# Patient Record
Sex: Female | Born: 1950
Health system: Southern US, Community
[De-identification: ages and names within clinical notes are randomized; demographics above are authoritative.]

## PROBLEM LIST (undated history)

## (undated) DIAGNOSIS — F0390 Unspecified dementia without behavioral disturbance: Secondary | ICD-10-CM

## (undated) DIAGNOSIS — M5030 Other cervical disc degeneration, unspecified cervical region: Secondary | ICD-10-CM

## (undated) DIAGNOSIS — K219 Gastro-esophageal reflux disease without esophagitis: Secondary | ICD-10-CM

## (undated) DIAGNOSIS — F329 Major depressive disorder, single episode, unspecified: Secondary | ICD-10-CM

## (undated) DIAGNOSIS — N200 Calculus of kidney: Secondary | ICD-10-CM

## (undated) DIAGNOSIS — E785 Hyperlipidemia, unspecified: Secondary | ICD-10-CM

## (undated) DIAGNOSIS — F32A Depression, unspecified: Secondary | ICD-10-CM

## (undated) DIAGNOSIS — I10 Essential (primary) hypertension: Secondary | ICD-10-CM

## (undated) HISTORY — DX: Essential (primary) hypertension: I10

## (undated) HISTORY — DX: Gastro-esophageal reflux disease without esophagitis: K21.9

## (undated) HISTORY — DX: Calculus of kidney: N20.0

## (undated) HISTORY — DX: Depression, unspecified: F32.A

## (undated) HISTORY — DX: Hyperlipidemia, unspecified: E78.5

## (undated) HISTORY — DX: Other cervical disc degeneration, unspecified cervical region: M50.30

## (undated) HISTORY — DX: Major depressive disorder, single episode, unspecified: F32.9

---

## 1986-09-14 HISTORY — PX: OTHER SURGICAL HISTORY: SHX169

## 1998-07-23 ENCOUNTER — Other Ambulatory Visit: Admission: RE | Admit: 1998-07-23 | Discharge: 1998-07-23 | Payer: Self-pay | Admitting: *Deleted

## 2001-07-04 ENCOUNTER — Other Ambulatory Visit: Admission: RE | Admit: 2001-07-04 | Discharge: 2001-07-04 | Payer: Self-pay | Admitting: *Deleted

## 2001-07-18 ENCOUNTER — Encounter: Admission: RE | Admit: 2001-07-18 | Discharge: 2001-07-18 | Payer: Self-pay | Admitting: *Deleted

## 2001-07-18 ENCOUNTER — Encounter: Payer: Self-pay | Admitting: *Deleted

## 2002-07-07 ENCOUNTER — Other Ambulatory Visit: Admission: RE | Admit: 2002-07-07 | Discharge: 2002-07-07 | Payer: Self-pay | Admitting: *Deleted

## 2002-08-01 ENCOUNTER — Encounter: Admission: RE | Admit: 2002-08-01 | Discharge: 2002-08-01 | Payer: Self-pay | Admitting: *Deleted

## 2002-08-01 ENCOUNTER — Encounter: Payer: Self-pay | Admitting: *Deleted

## 2002-11-13 ENCOUNTER — Encounter (INDEPENDENT_AMBULATORY_CARE_PROVIDER_SITE_OTHER): Payer: Self-pay

## 2002-11-13 ENCOUNTER — Ambulatory Visit (HOSPITAL_COMMUNITY): Admission: RE | Admit: 2002-11-13 | Discharge: 2002-11-13 | Payer: Self-pay | Admitting: *Deleted

## 2003-07-10 ENCOUNTER — Other Ambulatory Visit: Admission: RE | Admit: 2003-07-10 | Discharge: 2003-07-10 | Payer: Self-pay | Admitting: *Deleted

## 2003-08-03 ENCOUNTER — Encounter: Admission: RE | Admit: 2003-08-03 | Discharge: 2003-08-03 | Payer: Self-pay | Admitting: *Deleted

## 2004-07-14 ENCOUNTER — Ambulatory Visit (HOSPITAL_COMMUNITY): Admission: RE | Admit: 2004-07-14 | Discharge: 2004-07-14 | Payer: Self-pay | Admitting: Family Medicine

## 2004-08-14 ENCOUNTER — Ambulatory Visit (HOSPITAL_COMMUNITY): Admission: RE | Admit: 2004-08-14 | Discharge: 2004-08-14 | Payer: Self-pay | Admitting: *Deleted

## 2004-11-19 ENCOUNTER — Encounter (INDEPENDENT_AMBULATORY_CARE_PROVIDER_SITE_OTHER): Payer: Self-pay | Admitting: Specialist

## 2004-11-19 ENCOUNTER — Ambulatory Visit (HOSPITAL_COMMUNITY): Admission: RE | Admit: 2004-11-19 | Discharge: 2004-11-19 | Payer: Self-pay | Admitting: *Deleted

## 2005-10-23 ENCOUNTER — Ambulatory Visit (HOSPITAL_COMMUNITY): Admission: RE | Admit: 2005-10-23 | Discharge: 2005-10-23 | Payer: Self-pay | Admitting: *Deleted

## 2006-01-14 ENCOUNTER — Encounter: Admission: RE | Admit: 2006-01-14 | Discharge: 2006-01-14 | Payer: Self-pay | Admitting: Family Medicine

## 2006-07-02 ENCOUNTER — Ambulatory Visit (HOSPITAL_COMMUNITY): Admission: RE | Admit: 2006-07-02 | Discharge: 2006-07-03 | Payer: Self-pay | Admitting: Neurological Surgery

## 2006-07-27 ENCOUNTER — Encounter: Admission: RE | Admit: 2006-07-27 | Discharge: 2006-07-27 | Payer: Self-pay | Admitting: Neurological Surgery

## 2006-09-14 HISTORY — PX: NECK SURGERY: SHX720

## 2006-11-01 ENCOUNTER — Ambulatory Visit (HOSPITAL_COMMUNITY): Admission: RE | Admit: 2006-11-01 | Discharge: 2006-11-01 | Payer: Self-pay | Admitting: *Deleted

## 2006-11-01 ENCOUNTER — Encounter: Admission: RE | Admit: 2006-11-01 | Discharge: 2006-11-01 | Payer: Self-pay | Admitting: Neurological Surgery

## 2007-01-03 ENCOUNTER — Encounter: Admission: RE | Admit: 2007-01-03 | Discharge: 2007-01-03 | Payer: Self-pay | Admitting: Neurological Surgery

## 2007-04-12 ENCOUNTER — Encounter: Admission: RE | Admit: 2007-04-12 | Discharge: 2007-04-12 | Payer: Self-pay | Admitting: Neurological Surgery

## 2007-11-21 ENCOUNTER — Encounter: Admission: RE | Admit: 2007-11-21 | Discharge: 2007-11-21 | Payer: Self-pay | Admitting: Neurological Surgery

## 2007-12-06 ENCOUNTER — Ambulatory Visit (HOSPITAL_COMMUNITY): Admission: RE | Admit: 2007-12-06 | Discharge: 2007-12-06 | Payer: Self-pay | Admitting: *Deleted

## 2007-12-06 ENCOUNTER — Encounter: Admission: RE | Admit: 2007-12-06 | Discharge: 2007-12-06 | Payer: Self-pay | Admitting: Neurological Surgery

## 2007-12-14 ENCOUNTER — Encounter: Admission: RE | Admit: 2007-12-14 | Discharge: 2007-12-14 | Payer: Self-pay | Admitting: Neurological Surgery

## 2009-09-16 ENCOUNTER — Encounter: Admission: RE | Admit: 2009-09-16 | Discharge: 2009-09-16 | Payer: Self-pay | Admitting: Family Medicine

## 2010-10-05 ENCOUNTER — Encounter: Payer: Self-pay | Admitting: Neurological Surgery

## 2010-10-05 ENCOUNTER — Encounter: Payer: Self-pay | Admitting: *Deleted

## 2011-01-30 NOTE — Op Note (Signed)
   NAME:  Tina Fitzpatrick, Tina Fitzpatrick                         ACCOUNT NO.:  [id number]   MEDICAL RECORD NO.:  [id number]                   PATIENT TYPE:  AMB   LOCATION:  ENDO                                 FACILITY:  [facility]   PHYSICIAN:  Georgiana Spinner, M.D.                 DATE OF BIRTH:  Apr 22, 1951   DATE OF PROCEDURE:  11/13/2002  DATE OF DISCHARGE:                                 OPERATIVE REPORT   PROCEDURE:  Upper endoscopy.   ENDOSCOPIST:  Georgiana Spinner, M.D.   INDICATIONS FOR PROCEDURE:  Gastroesophageal reflux disease.   ANESTHESIA:  Demerol 100 mg, Versed 10 mg.  Additionally Phenergan 12.5 mg.   DESCRIPTION OF PROCEDURE:  With the patient mildly sedated, in the left  lateral decubitus position, the Olympus videoscopic endoscope was inserted  into the mouth and passed under direct vision through the esophagus, which  appeared normal except for a question of Barrett's.  The delineation line  was not very clear, so we could photograph and biopsy this area.  Entered  into the stomach.  The fundus, body, antrum, duodenal bulb, second portion  of the duodenum all appeared normal.  From this point the endoscope was  slowly withdrawn, taking circumferential views of the entire duodenal  mucosa.  The endoscope was then pulled back into the stomach and placed in  retroflexion to view the stomach from below.  The endoscope was straightened  and withdrawn, taking circumferential views of the remaining gastric and  esophageal mucosa.  The patient's vital signs and pulse oximeter remained  stable.  The patient tolerated the procedure well without apparent complications.   FINDINGS:  A question of Barrett's esophagus.   BIOPSY:  Await the biopsy report.   DISPOSITION:  The patient will call me for the results and follow up with me  as an outpatient.  Proceed to colonoscopy as planned.                                                Georgiana Spinner, M.D.    GMO/MEDQ  D:  11/13/2002  T:   11/13/2002  Job:  [id number]

## 2011-01-30 NOTE — Op Note (Signed)
   NAME:  Tina Fitzpatrick, Tina Fitzpatrick                         ACCOUNT NO.:  [id number]   MEDICAL RECORD NO.:  [id number]                   PATIENT TYPE:  AMB   LOCATION:  ENDO                                 FACILITY:  [facility]   PHYSICIAN:  Georgiana Spinner, M.D.                 DATE OF BIRTH:  28-Apr-1951   DATE OF PROCEDURE:  11/13/2002  DATE OF DISCHARGE:                                 OPERATIVE REPORT   PROCEDURE PERFORMED:  Colonoscopy.   ENDOSCOPIST:  Georgiana Spinner, M.D.   INDICATIONS FOR PROCEDURE:  Colon polyp, colon cancer screening.   ANESTHESIA:  Demerol 25 mg, Versed 2 mg.   DESCRIPTION OF PROCEDURE:  With the patient mildly sedated in the left  lateral decubitus position, the Olympus video colonoscope was inserted in  the rectum and passed under direct vision to the cecum, identified by the  ileocecal valve and appendiceal orifice, the latter of which was  photographed.  From this point the colonoscope was slowly withdrawn taking  circumferential views of the entire colonic mucosa stopping at 10 cm from  the anal verge at which point, a small polyp was seen and photographed and  removed using hot biopsy forceps technique on a setting of 20/20 blended  current.  The endoscope was straightened and withdrawn taking  circumferential views of the remaining colonic mucosa both on direct and  retroflex view of the rectum.  The patient's vital signs and pulse oximeter  remained stable.  The patient tolerated the procedure well without apparent  complications.   FINDINGS:  Small polyp at 10 cm from the anal verge.  Otherwise unremarkable  examination other than hemorrhoids.   PLAN:  Have the patient call me for results of the biopsy and follow up with  me as an outpatient.                                                 Georgiana Spinner, M.D.    GMO/MEDQ  D:  11/13/2002  T:  11/13/2002  Job:  [id number]

## 2011-01-30 NOTE — Op Note (Signed)
NAME:  Tina Fitzpatrick, FEES NO.:  [id number]   MEDICAL RECORD NO.:  [id number]          PATIENT TYPE:  OIB   LOCATION:  3039                         FACILITY:  [facility]   PHYSICIAN:  Tia Alert, MD     DATE OF BIRTH:  [birth year]   DATE OF PROCEDURE:  07/02/2006  DATE OF DISCHARGE:  07/03/2006                                 OPERATIVE REPORT   PREOPERATIVE DIAGNOSIS:  Cervical spondylosis C5-6, C6-7.   POSTOPERATIVE DIAGNOSIS:  Cervical spondylosis C5-6, C6-7.   PROCEDURE:  1. Decompressive anterior cervical diskectomy C5-6, C6-7.  2. Anterior cervical arthrodesis C5-6, C6-7 utilizing an 8-mm PEEK      interbody cage packed with local autograft and Vitoss at C5-6 and a 7-      mm PEEK interbody cage packed with Vitoss and local bone at C6-7.  3. Anterior cervical plating C5 to C7 inclusive utilizing a 40-mm venture      plate.   SURGEON:  Tia Alert, M.D.   ASSISTANT:  Donalee Citrin, M.D.   ANESTHESIA:  General endotracheal.   COMPLICATIONS:  None apparent.   INDICATIONS FOR PROCEDURE:  Ms. Tina Fitzpatrick is a 60 year old female who was  referred with severe neck pain with bilateral shoulder pain.  She tried  medical management for quite some time without significant relief.  Her pain  became progressively worse.  She had an MRI which showed significant  spondylosis at C5-6 and C6-7.  I recommended a two-level ACDF with plating  at C5-6 and C6-7 in hopes of improving her pain syndrome.  She understood  the risks, benefits, expected outcome and wished to proceed.   DESCRIPTION OF PROCEDURE:  The patient was taken to the operating room and  after induction of adequate general endotracheal anesthesia, she was placed  in the supine position on the operating room table.  Her right anterior  cervical region was prepped with DuraPrep and then draped in the usual  sterile fashion.  Three mL of local anesthesia was injected and a small  transverse incision was made to the  right of midline and carried down to the  platysma which was elevated, opened and undermined with Metzenbaum scissors.  I then dissected a plane medial to the sternocleidomastoid muscle and  internal carotid artery and lateral to the trachea and esophagus to expose  C5-6 and C6-7.  There were large anterior osteophytes at each of those two  levels and they were removed with Leksell rongeur.  Intraoperative  fluoroscopy confirmed my level, and the longus colli muscles were taken down  and the Shadow-Line retractors placed under these to expose C5-6 and C6-7.  The annulus was incised and initial diskectomy was done with pituitary  rongeurs and curved curettes.  I then brought in the high-speed drill and  drilled the endplates in a rectangular fashion to open the disk space at C5-  6 to 8 mm and at C6-7 to 7 mm.  We saved the drill shavings in a mucus trap  for later arthrodesis.  These were mixed with the Vitoss.  We drilled down  to  the level of the posterior longitudinal ligament and brought in the  operating microscope.  Utilizing microscopic dissection opened the posterior  longitudinal ligament at C5-6 and removed it in a circumferential fashion by  undercutting the bodies of C5 and C6 to decompress the central canal.  Bilateral foraminotomies were performed.  We dissected until we found the  pedicle and marched along the pedicle into the foramen to decompress the  nerve roots bilaterally.  We palpated with a nerve hook into the midline and  into the foramina to assure adequate decompression.  The dura was capacious  and full all the way across.  We then lined this with Gelfoam, went to C6-7  and did the same thing by opening the posterior longitudinal ligament and  then removing this in a circumferential fashion by undercutting the bodies  of C6 and C7 to decompress the central canal.  Again, the pedicles were  identified, and we marched along the medial and superior part of the  pedicle  to decompress the foramina, and the C7 nerve roots were identified and  decompressed.  We then palpated again with a nerve hook to assure adequate  decompression.  We dried our surgical bed with Surgifoam and then used an 8-  mm PEEK interbody cage packed with Vitoss and local autograft at C5-6 and a  7-mm cage packed with Vtss and local autograft at C6-7.  We then used a 40-  mm venture plate and placed two 13-mm variable-angle screws in the bodies of  C5, C6 and C7, and these were locked into place by the locking mechanism on  the plate.  We then irrigated with saline solution containing bacitracin,  dried all bleeding points with bipolar cautery and once meticulous  hemostasis was achieved closed the platysma with 3-0 Vicryl, closed the  subcuticular tissue with 3-0 Vicryl and closed the skin with Benzoin and  Steri-Strips.  The drapes were removed.  Sterile dressing was applied.  The  patient was awakened from general anesthesia and transported to the recovery  room in stable condition.  At the end of the procedure, all sponge, needle  and instruments counts were correct.      Tia Alert, MD  Electronically Signed     DSJ/MEDQ  D:  07/02/2006  T:  07/03/2006  Job:  [id number]

## 2011-12-30 ENCOUNTER — Encounter: Payer: Self-pay | Admitting: Family Medicine

## 2011-12-30 ENCOUNTER — Ambulatory Visit (INDEPENDENT_AMBULATORY_CARE_PROVIDER_SITE_OTHER): Payer: Medicare Other | Admitting: Family Medicine

## 2011-12-30 VITALS — BP 132/80 | HR 79 | Temp 98.3°F | Ht 65.0 in | Wt 171.0 lb

## 2011-12-30 DIAGNOSIS — F32A Depression, unspecified: Secondary | ICD-10-CM | POA: Insufficient documentation

## 2011-12-30 DIAGNOSIS — Z1231 Encounter for screening mammogram for malignant neoplasm of breast: Secondary | ICD-10-CM

## 2011-12-30 DIAGNOSIS — K219 Gastro-esophageal reflux disease without esophagitis: Secondary | ICD-10-CM

## 2011-12-30 DIAGNOSIS — E785 Hyperlipidemia, unspecified: Secondary | ICD-10-CM

## 2011-12-30 DIAGNOSIS — M5030 Other cervical disc degeneration, unspecified cervical region: Secondary | ICD-10-CM

## 2011-12-30 DIAGNOSIS — I10 Essential (primary) hypertension: Secondary | ICD-10-CM

## 2011-12-30 DIAGNOSIS — Z780 Asymptomatic menopausal state: Secondary | ICD-10-CM

## 2011-12-30 DIAGNOSIS — F329 Major depressive disorder, single episode, unspecified: Secondary | ICD-10-CM

## 2011-12-30 LAB — CBC WITH DIFFERENTIAL/PLATELET
Eosinophils Relative: 4.7 % (ref 0.0–5.0)
HCT: 37.7 % (ref 36.0–46.0)
Hemoglobin: 12.9 g/dL (ref 12.0–15.0)
Lymphs Abs: 1.7 10*3/uL (ref 0.7–4.0)
Monocytes Relative: 6.2 % (ref 3.0–12.0)
Neutro Abs: 2.4 10*3/uL (ref 1.4–7.7)
Platelets: 161 10*3/uL (ref 150.0–400.0)
WBC: 4.7 10*3/uL (ref 4.5–10.5)

## 2011-12-30 LAB — BASIC METABOLIC PANEL
CO2: 25 mEq/L (ref 19–32)
Calcium: 9.5 mg/dL (ref 8.4–10.5)
Glucose, Bld: 101 mg/dL — ABNORMAL HIGH (ref 70–99)
Sodium: 141 mEq/L (ref 135–145)

## 2011-12-30 LAB — HEPATIC FUNCTION PANEL
ALT: 79 U/L — ABNORMAL HIGH (ref 0–35)
Albumin: 4.5 g/dL (ref 3.5–5.2)
Alkaline Phosphatase: 66 U/L (ref 39–117)
Total Protein: 7.5 g/dL (ref 6.0–8.3)

## 2011-12-30 LAB — LIPID PANEL: Triglycerides: 310 mg/dL — ABNORMAL HIGH (ref 0.0–149.0)

## 2011-12-30 MED ORDER — LISINOPRIL 5 MG PO TABS: 5.0000 mg | ORAL_TABLET | Freq: Every day | ORAL | Status: DC

## 2011-12-30 MED ORDER — TIZANIDINE HCL 4 MG PO CAPS: 4.0000 mg | ORAL_CAPSULE | Freq: Three times a day (TID) | ORAL | Status: DC | PRN

## 2011-12-30 MED ORDER — PROMETHAZINE HCL 12.5 MG PO TABS: 12.5000 mg | ORAL_TABLET | Freq: Three times a day (TID) | ORAL | Status: DC | PRN

## 2011-12-30 MED ORDER — SIMVASTATIN 40 MG PO TABS: 40.0000 mg | ORAL_TABLET | Freq: Every evening | ORAL | Status: DC

## 2011-12-30 MED ORDER — TRAMADOL HCL 50 MG PO TABS: 50.0000 mg | ORAL_TABLET | Freq: Four times a day (QID) | ORAL | Status: DC | PRN

## 2011-12-30 MED ORDER — ATENOLOL 50 MG PO TABS: 50.0000 mg | ORAL_TABLET | Freq: Every day | ORAL | Status: DC

## 2011-12-30 MED ORDER — ESOMEPRAZOLE MAGNESIUM 40 MG PO CPDR: 40.0000 mg | DELAYED_RELEASE_CAPSULE | Freq: Every day | ORAL | Status: DC

## 2011-12-30 NOTE — Patient Instructions (Signed)
Schedule your complete physical at your convenience We'll notify you of your lab results Call with any questions or concerns Think of [place] as your home base Welcome!  We're glad to have you!

## 2011-12-30 NOTE — Progress Notes (Signed)
  Subjective:    Patient ID: Tina Fitzpatrick, female    DOB: 1951/09/03, 61 y.o.   MRN: [id number]  HPI New to establish.  Previous MD- Dr Cliffton Asters at [facility].  Was dismissed due to missed appts.  DDD of cervical spine w/ metal plate- this is cause of pt's disability.  Neurosurg- Dr Marikay Alar.  Surgery done 2007.  Overdue on pap, mammo.  UTD on colonoscopy w/ [facility].  Hyperlipidemia- chronic problem, on statin.  No abd pain, N/V.  Labs last done 6-12 months ago.  HTN- chronic problem.  No CP, SOB, HAs, visual changes, edema.  Depression/anxiety- following w/ Dr Chriss Czar   Review of Systems For ROS see HPI     Objective:   Physical Exam  Vitals reviewed. Constitutional: She is oriented to person, place, and time. She appears well-developed and well-nourished. No distress.  HENT:  Head: Normocephalic and atraumatic.  Eyes: Conjunctivae and EOM are normal. Pupils are equal, round, and reactive to light.  Neck: Normal range of motion. Neck supple. No thyromegaly present.  Cardiovascular: Normal rate, regular rhythm, normal heart sounds and intact distal pulses.   No murmur heard. Pulmonary/Chest: Effort normal and breath sounds normal. No respiratory distress.  Abdominal: Soft. She exhibits no distension. There is no tenderness.  Musculoskeletal: She exhibits no edema.  Lymphadenopathy:    She has no cervical adenopathy.  Neurological: She is alert and oriented to person, place, and time.  Skin: Skin is warm and dry.  Psychiatric: She has a normal mood and affect. Her behavior is normal.          Assessment & Plan:

## 2011-12-31 MED ORDER — FENOFIBRATE 160 MG PO TABS: 160.0000 mg | ORAL_TABLET | Freq: Every day | ORAL | Status: DC

## 2012-01-01 ENCOUNTER — Other Ambulatory Visit: Payer: Self-pay | Admitting: Family Medicine

## 2012-01-01 NOTE — Telephone Encounter (Signed)
Message from Triage line Please call regarding prescription  Phone# [phone]

## 2012-01-01 NOTE — Telephone Encounter (Signed)
Pt states that pharmacy advise her insurance wont pay for med. Advise Pt that med will need PA and will start process on Monday. Pt ok info.

## 2012-01-03 LAB — VITAMIN D 1,25 DIHYDROXY
Vitamin D 1, 25 (OH)2 Total: 85 pg/mL — ABNORMAL HIGH (ref 18–72)
Vitamin D2 1, 25 (OH)2: <8 pg/mL
Vitamin D3 1, 25 (OH)2: 85 pg/mL

## 2012-01-05 ENCOUNTER — Telehealth: Payer: Self-pay | Admitting: Family Medicine

## 2012-01-05 NOTE — Telephone Encounter (Signed)
Pt returning call

## 2012-01-05 NOTE — Telephone Encounter (Signed)
Pt advised that she can not afford the Nexium and wanted to see if there is another medication that would be cheaper

## 2012-01-05 NOTE — Telephone Encounter (Signed)
Caller: Shifra/Patient; Phone Number: [phone]; Message from caller: Relates she saw MD last week and rec'd message to contact office regarding medication.  Number provided is best callback number.  She uses [employer] [phone].

## 2012-01-05 NOTE — Telephone Encounter (Addendum)
PA fax form received. Left message to call office to confirm with Pt when med originally started and if she has tried any other med for this conditions(Baclofen, Damtrolene).

## 2012-01-06 ENCOUNTER — Telehealth: Payer: Self-pay

## 2012-01-06 ENCOUNTER — Other Ambulatory Visit: Payer: Self-pay

## 2012-01-06 MED ORDER — OMEPRAZOLE 40 MG PO CPDR: 40.0000 mg | DELAYED_RELEASE_CAPSULE | Freq: Every day | ORAL | Status: DC

## 2012-01-06 MED ORDER — TIZANIDINE HCL 4 MG PO TABS: 4.0000 mg | ORAL_TABLET | Freq: Four times a day (QID) | ORAL | Status: AC | PRN

## 2012-01-06 NOTE — Telephone Encounter (Signed)
PA for Zanaflex 4 mg capsules. The insurance company will not cover the capsules but will cover the tablets. The patient is ok with this and she has actually taking the tablets. Rx faxed.     KP

## 2012-01-06 NOTE — Telephone Encounter (Signed)
Call from patient and she stated that she went to pick up the Nexium and it was 45 dollars, she wanted to know if she could get something cheaper called in. Please advise    KP

## 2012-01-06 NOTE — Telephone Encounter (Signed)
Ok for Omeprazole 40mg  daily, #30, 3 refills

## 2012-01-06 NOTE — Telephone Encounter (Signed)
Rx faxed and patient has been made aware      KP 

## 2012-01-06 NOTE — Telephone Encounter (Signed)
Noted new rx was sent for pt to take Omeprazole 40mg  today via escribe

## 2012-01-06 NOTE — Telephone Encounter (Signed)
Can switch to omeprazole 40mg  daily

## 2012-01-12 NOTE — Assessment & Plan Note (Signed)
New to provider, chronic for pt.  Tolerating statin w/out difficulty.  Check labs.  Adjust meds prn  

## 2012-01-12 NOTE — Assessment & Plan Note (Signed)
New to provider, chronic for pt.  Cause of pt's disability.  Has neurosurg.  Will follow and assist as able.

## 2012-01-12 NOTE — Assessment & Plan Note (Signed)
New to provider but chronic problem for pt.  Fair control.  Asymptomatic.  No changes.  Will follow.  Check labs.

## 2012-01-12 NOTE — Assessment & Plan Note (Signed)
New to provider- chronic problem for pt.  On Nexium.

## 2012-01-12 NOTE — Assessment & Plan Note (Signed)
New to provider, chronic for pt.  Following w/ psych.  Feels sxs are well controlled.

## 2012-01-12 NOTE — Telephone Encounter (Signed)
Left message to call office

## 2012-01-15 NOTE — Telephone Encounter (Signed)
Left message to call office

## 2012-01-22 ENCOUNTER — Other Ambulatory Visit: Payer: Self-pay | Admitting: Family Medicine

## 2012-01-22 MED ORDER — PROMETHAZINE HCL 12.5 MG PO TABS: 12.5000 mg | ORAL_TABLET | Freq: Three times a day (TID) | ORAL | Status: DC | PRN

## 2012-01-22 NOTE — Telephone Encounter (Signed)
Refill x 2  Ultram 50MG  Tab  Qty 60 Take one tablet by mouth every 6-hours as needed Last filled 5.3.13  &  Promethazine 12.5MG  TAB Qty 20 Take one tablet by mouth every 8-hours as needed for Nausea Last filled 4.17.13  Last OV 4.17.13

## 2012-01-22 NOTE — Telephone Encounter (Signed)
Noted pt received a RX for Ultram on 12-30-11 for #60 with 1 refill, pt still has another refill, thus no refill at this time, rx sent to pharmacy by e-script For the phenergan

## 2012-01-28 ENCOUNTER — Other Ambulatory Visit: Payer: Self-pay | Admitting: Family Medicine

## 2012-01-28 DIAGNOSIS — Z1211 Encounter for screening for malignant neoplasm of colon: Secondary | ICD-10-CM

## 2012-01-28 MED ORDER — TIZANIDINE HCL 4 MG PO TABS: 4.0000 mg | ORAL_TABLET | Freq: Three times a day (TID) | ORAL | Status: DC | PRN

## 2012-01-28 MED ORDER — TRAMADOL HCL 50 MG PO TABS: 50.0000 mg | ORAL_TABLET | Freq: Four times a day (QID) | ORAL | Status: DC | PRN

## 2012-01-28 NOTE — Telephone Encounter (Signed)
Received call from Pam at pharmacy to advise pt needs more ultram per recent rx was sent for #60, noted pt is new to establish and incorrect dosage sent for pt, spoke to MD Tabori per pt notes has DDD and takes ultram for this disease, was advised verbally to give pt Ultram 50mg  #90 with 3 refills, gave verbal order to fill Zanflex 40mg  TID #90 with no refills, Joe the pharmacist understood, changed orders in chart as phone in to clarify all orders per confusion noted

## 2012-01-28 NOTE — Telephone Encounter (Signed)
Refill Zanaflex 40MG  Qty 90  Instructions on fax: Take one tablet by mouth every 6-hours as needed  Last Written 4.17.13, qty 90 Instructions said Take 1-tablet by mouth 3 times daily as needed  Last OV 4.17.13

## 2012-02-02 ENCOUNTER — Telehealth: Payer: Self-pay | Admitting: *Deleted

## 2012-02-02 NOTE — Telephone Encounter (Signed)
Prior Auth Approved 4 mg capsule,Approved 09-13-12 under your Medicare Part D benefit.

## 2012-02-10 ENCOUNTER — Ambulatory Visit (HOSPITAL_COMMUNITY)
Admission: RE | Admit: 2012-02-10 | Discharge: 2012-02-10 | Disposition: A | Discharge status: Home / Self Care | Payer: Medicare Other | Source: Ambulatory Visit | Attending: Family Medicine | Admitting: Family Medicine

## 2012-02-10 ENCOUNTER — Encounter: Payer: Self-pay | Admitting: Family Medicine

## 2012-02-10 ENCOUNTER — Ambulatory Visit (INDEPENDENT_AMBULATORY_CARE_PROVIDER_SITE_OTHER): Payer: Medicare Other | Admitting: Family Medicine

## 2012-02-10 VITALS — BP 128/78 | HR 85 | Temp 98.3°F | Ht 65.5 in | Wt 179.2 lb

## 2012-02-10 DIAGNOSIS — Z7189 Other specified counseling: Secondary | ICD-10-CM | POA: Insufficient documentation

## 2012-02-10 DIAGNOSIS — Z0000 Encounter for general adult medical examination without abnormal findings: Secondary | ICD-10-CM | POA: Insufficient documentation

## 2012-02-10 DIAGNOSIS — E785 Hyperlipidemia, unspecified: Secondary | ICD-10-CM

## 2012-02-10 DIAGNOSIS — Z780 Asymptomatic menopausal state: Secondary | ICD-10-CM

## 2012-02-10 DIAGNOSIS — Z1231 Encounter for screening mammogram for malignant neoplasm of breast: Secondary | ICD-10-CM | POA: Insufficient documentation

## 2012-02-10 NOTE — Patient Instructions (Signed)
Schedule your pap/pelvic at your convenience this summer We'll notify you of your lab results and your GI appt Your exam looks good!  Keep it up! Call with any questions or concerns Have a great summer!!

## 2012-02-10 NOTE — Progress Notes (Signed)
  Subjective:    Patient ID: Tina Fitzpatrick, female    DOB: [birth year], 61 y.o.   MRN: [id number]  HPI  Here today for CPE.  Risk Factors: Hyperlipidemia- on recent labs pt was found to have elevated trigs.  Started Fenofibrate.  Due for repeat LFTs. Physical Activity: limited exercise due to DDD Fall Risk: low, steady on feet Depression: chronic problem for pt, well controlled Hearing: normal to conversational tones and whispered voice ADL's: independent Cognitive: normal linear thought process, memory and attention intact Home Safety: safe at home Height, Weight, BMI, Visual Acuity: see vitals, vision corrected to 20/20 w/ glasses Counseling: overdue on colonoscopy, can't remember name of physician.  Has mammo and bone density scheduled for this afternoon.  Has not had pap in 5 yrs (2008).  Wants to hold off on pelvic exam today Labs Ordered: See A&P Care Plan: See A&P    Review of Systems Patient reports no vision/ hearing changes, adenopathy,fever, weight change,  persistant/recurrent hoarseness , swallowing issues, chest pain, palpitations, edema, persistant/recurrent cough, hemoptysis, dyspnea (rest/exertional/paroxysmal nocturnal), gastrointestinal bleeding (melena, rectal bleeding), abdominal pain, significant heartburn, bowel changes, GU symptoms (dysuria, hematuria, incontinence), Gyn symptoms (abnormal  bleeding, pain),  syncope, focal weakness, memory loss, numbness & tingling, skin/hair/nail changes, abnormal bruising or bleeding, anxiety, or depression.     Objective:   Physical Exam General Appearance:    Alert, cooperative, no distress, appears stated age  Head:    Normocephalic, without obvious abnormality, atraumatic  Eyes:    PERRL, conjunctiva/corneas clear, EOM's intact, fundi    benign, both eyes  Ears:    Normal TM's and external ear canals, both ears  Nose:   Nares normal, septum midline, mucosa normal, no drainage    or sinus tenderness  Throat:   Lips,  mucosa, and tongue normal; teeth and gums normal  Neck:   Supple, symmetrical, trachea midline, no adenopathy;    Thyroid: no enlargement/tenderness/nodules  Back:     Symmetric, no curvature, ROM normal, no CVA tenderness  Lungs:     Clear to auscultation bilaterally, respirations unlabored  Chest Wall:    No tenderness or deformity   Heart:    Regular rate and rhythm, S1 and S2 normal, no murmur, rub   or gallop  Breast Exam:    Deferred due to mammo this afternoon  Abdomen:     Soft, non-tender, bowel sounds active all four quadrants,    no masses, no organomegaly  Genitalia:    Deferred at pt's request  Rectal:    Extremities:   Extremities normal, atraumatic, no cyanosis or edema  Pulses:   2+ and symmetric all extremities  Skin:   Skin color, texture, turgor normal, no rashes or lesions  Lymph nodes:   Cervical, supraclavicular, and axillary nodes normal  Neurologic:   CNII-XII intact, normal strength, sensation and reflexes    throughout          Assessment & Plan:

## 2012-02-11 LAB — HEPATIC FUNCTION PANEL
AST: 54 U/L — ABNORMAL HIGH (ref 0–37)
Albumin: 4.6 g/dL (ref 3.5–5.2)
Alkaline Phosphatase: 43 U/L (ref 39–117)
Bilirubin, Direct: 0.1 mg/dL (ref 0.0–0.3)
Total Protein: 7.7 g/dL (ref 6.0–8.3)

## 2012-02-12 ENCOUNTER — Encounter: Payer: Self-pay | Admitting: *Deleted

## 2012-02-15 ENCOUNTER — Telehealth: Payer: Self-pay | Admitting: *Deleted

## 2012-02-15 NOTE — Telephone Encounter (Signed)
Noted pt recent bone density exam results as normal per MD Tabori, pt was mailed copy of results to address in chart.

## 2012-02-17 ENCOUNTER — Telehealth: Payer: Self-pay | Admitting: *Deleted

## 2012-02-17 ENCOUNTER — Other Ambulatory Visit: Payer: Self-pay | Admitting: Family Medicine

## 2012-02-17 MED ORDER — TIZANIDINE HCL 4 MG PO TABS: 4.0000 mg | ORAL_TABLET | Freq: Three times a day (TID) | ORAL | Status: DC | PRN

## 2012-02-17 NOTE — Telephone Encounter (Signed)
rx sent to pharmacy by e-script  

## 2012-02-17 NOTE — Telephone Encounter (Signed)
Mailed pt normal results from recent bone density scan

## 2012-02-17 NOTE — Telephone Encounter (Signed)
Refill Zanaflex 4mg  tab  Qty 90 Take on tablet by mouth three timed daily as needed Last fill 5.16.13 Last ov 5.29.13

## 2012-02-21 ENCOUNTER — Encounter: Payer: Self-pay | Admitting: Family Medicine

## 2012-02-21 NOTE — Assessment & Plan Note (Signed)
New.  Check LFTs after starting Fenofibrate.

## 2012-02-21 NOTE — Assessment & Plan Note (Signed)
New.  Pt's PE WNL.  Declines GYN exam today.  Has mammo and DEXA pending.  Will refer for colonoscopy.  Reviewed labs from previous visit.  EKG done- see document for interpretation.  Anticipatory guidance provided.

## 2012-02-22 ENCOUNTER — Encounter: Payer: Self-pay | Admitting: Family Medicine

## 2012-02-24 ENCOUNTER — Encounter: Payer: Self-pay | Admitting: Family Medicine

## 2012-03-03 ENCOUNTER — Telehealth: Payer: Self-pay | Admitting: *Deleted

## 2012-03-03 DIAGNOSIS — Z0000 Encounter for general adult medical examination without abnormal findings: Secondary | ICD-10-CM

## 2012-03-03 NOTE — Telephone Encounter (Signed)
Pt called back stating that she was to have a colonoscopy done since it has been over 10 years. Pt notes that at OV she discuss with you about being referred to a provider who gives a pill versus the liquid.  No order in system for colonoscopy. See OV notes from 02-10-12. Please advise

## 2012-03-03 NOTE — Addendum Note (Signed)
Addended by: Sheliah Hatch on: 03/03/2012 12:11 PM   Modules accepted: Orders

## 2012-03-03 NOTE — Telephone Encounter (Signed)
Entered order for GI referral- she will need to discuss pill vs liquid at consultation as the GI docs make this decision on case by case basis

## 2012-03-04 NOTE — Telephone Encounter (Signed)
Discuss with patient  

## 2012-03-08 ENCOUNTER — Telehealth: Payer: Self-pay | Admitting: *Deleted

## 2012-03-08 NOTE — Telephone Encounter (Signed)
Will need to establish w/ another GI office

## 2012-03-08 NOTE — Telephone Encounter (Signed)
FYI: received incoming call from [place] with [facility] noting a recent referral placed to their office for this pt, Tina Fitzpatrick notes this pt has been dismissed from [employer] [facility] and they can not set up apt for this pt, advised referral rep verbally

## 2012-03-09 NOTE — Telephone Encounter (Signed)
Referral rep aware verbally

## 2012-03-15 ENCOUNTER — Telehealth: Payer: Self-pay | Admitting: Family Medicine

## 2012-03-15 MED ORDER — TIZANIDINE HCL 4 MG PO TABS: 4.0000 mg | ORAL_TABLET | Freq: Three times a day (TID) | ORAL | Status: DC | PRN

## 2012-03-15 NOTE — Telephone Encounter (Signed)
rx sent to pharmacy by e-script  

## 2012-03-15 NOTE — Telephone Encounter (Signed)
Refill: Zanaflex 4mg  tab. Take one by mouth three times daily as needed. Qty 90. Last fill 01-28-12

## 2012-03-21 ENCOUNTER — Other Ambulatory Visit: Payer: Self-pay | Admitting: Family Medicine

## 2012-03-21 NOTE — Telephone Encounter (Signed)
refill promethazine 12.5mg  tab #20 take one tablet by mouth every 8 hours as needed for nausea, last fill 5.10.13 Last ov 5.29.13

## 2012-03-22 MED ORDER — PROMETHAZINE HCL 12.5 MG PO TABS: 12.5000 mg | ORAL_TABLET | Freq: Three times a day (TID) | ORAL | Status: DC | PRN

## 2012-03-22 NOTE — Telephone Encounter (Signed)
rx sent to pharmacy by e-script  

## 2012-03-24 ENCOUNTER — Other Ambulatory Visit: Payer: Self-pay | Admitting: Family Medicine

## 2012-03-24 MED ORDER — FENOFIBRATE 160 MG PO TABS: 160.0000 mg | ORAL_TABLET | Freq: Every day | ORAL | Status: DC

## 2012-03-24 NOTE — Telephone Encounter (Signed)
Refill done.  

## 2012-03-24 NOTE — Telephone Encounter (Signed)
refill Fenofibrate (Tab) fenofibrate 160 MG Take 1 tablet (160 mg total) by mouth daily. #30, take 1-tablet by mouth every day, last fill 6.12.13, last ov 5.29.13

## 2012-04-07 ENCOUNTER — Ambulatory Visit: Payer: Medicare Other | Admitting: Family Medicine

## 2012-04-28 ENCOUNTER — Other Ambulatory Visit: Payer: Self-pay | Admitting: Family Medicine

## 2012-04-28 NOTE — Telephone Encounter (Signed)
rx sent to pharmacy by e-script  

## 2012-05-04 ENCOUNTER — Telehealth: Payer: Self-pay | Admitting: *Deleted

## 2012-05-04 NOTE — Telephone Encounter (Signed)
confidential Office Message [address] [place], [place] [address] p. [phone] f. [phone] To: [place] Fax: [phone] From: [facility] Date/ Time: 05/04/2012 3:01 AM Taken By: Gypsy Decant, RN Caller: Marylu Lund Facility: not collected Patient: Tina Fitzpatrick, Tina Fitzpatrick DOB: [birth year] Phone: [phone] Reason for Call: See info below Regarding Appointment: Yes Appt Date: 05/04/2012 Appt Time: 11:00:00 AM Provider: Sheliah Hatch Reason: Cancel Appointment Details: No transportation Outcome: Cancelled appointment

## 2012-05-05 ENCOUNTER — Ambulatory Visit: Payer: Medicare Other | Admitting: Family Medicine

## 2012-05-10 ENCOUNTER — Other Ambulatory Visit: Payer: Self-pay | Admitting: Family Medicine

## 2012-05-10 MED ORDER — PROMETHAZINE HCL 12.5 MG PO TABS: 12.5000 mg | ORAL_TABLET | Freq: Three times a day (TID) | ORAL | Status: DC | PRN

## 2012-05-10 NOTE — Telephone Encounter (Signed)
rx sent to pharmacy by e-script  

## 2012-05-10 NOTE — Telephone Encounter (Signed)
Refill Promethazine HCl (Tab) PHENERGAN 12.5 MG TAKE ONE TABLET BY MOUTH EVERY 8 HOURS AS NEEDED #  30 last fill 8.15.13  Last ov 5.29.13 V70  Current appt for 10.14.13 for PAP only

## 2012-05-10 NOTE — Telephone Encounter (Signed)
Noted MD Tabori sent refill via esbribe for tramadol

## 2012-05-10 NOTE — Telephone Encounter (Signed)
Last OV 02-10-12 last refill 01-28-12 #90 with 3 refills, note directions Q6hrs/PRN

## 2012-05-11 ENCOUNTER — Other Ambulatory Visit: Payer: Self-pay | Admitting: Family Medicine

## 2012-05-11 NOTE — Telephone Encounter (Signed)
rx sent to pharmacy by e-script  

## 2012-06-03 ENCOUNTER — Ambulatory Visit: Payer: Medicare Other | Admitting: Family Medicine

## 2012-06-06 ENCOUNTER — Telehealth: Payer: Self-pay | Admitting: Family Medicine

## 2012-06-06 NOTE — Telephone Encounter (Signed)
Refill: Promethazine 12.5mg  tab. Take 1 tablet by mouth every 8 hours as needed for nausea. Qty 30. Last fill 05-10-12

## 2012-06-06 NOTE — Telephone Encounter (Signed)
Per Dr.Hopper this is not a maintenance medication, I left message on voicemail for patient to return call when available. Reason for call (not left on voicemail): ? Why is patient taking reguarly? Patient needs to be seen if nausea is an ongoing concern. #6 pill can be given, otherwise patient to f/u with doctor to get to the source of nausea.

## 2012-06-07 MED ORDER — OMEPRAZOLE 40 MG PO CPDR: 40.0000 mg | DELAYED_RELEASE_CAPSULE | Freq: Every day | ORAL | Status: DC

## 2012-06-07 NOTE — Telephone Encounter (Signed)
Can have Trazodone 50mg  1/2-1 tab QHS prn sleep.  #30, 1 refill.  Should not use phenergan for sleep

## 2012-06-07 NOTE — Telephone Encounter (Signed)
Refill: Simvastatin 40mg  tab. Take one tablet by mouth in the evening. **Per doctor, 30 day only, pt is past due for office visit and fasting labs** last fill 05-10-12

## 2012-06-07 NOTE — Telephone Encounter (Signed)
Please note and advise 

## 2012-06-07 NOTE — Telephone Encounter (Signed)
Pt returning call regarding the promethazine says sometimes she gets nauseous from her medications, but admits she probably takes them more to help her sleep.  She is out of her omeprazole, she is not sure when she last took it.  I sent in refill for that.  Also she was on Elavil for sleep, but it did not work.  She would like something else since she should not be taking the promethazine for sleep.

## 2012-06-07 NOTE — Telephone Encounter (Signed)
Called pt to advise, noted pt husband answered phone and advised pt is not home, he will have pt call our office tomorrow per currently closed

## 2012-06-08 MED ORDER — SIMVASTATIN 40 MG PO TABS: 40.0000 mg | ORAL_TABLET | Freq: Every evening | ORAL | Status: DC

## 2012-06-08 MED ORDER — TRAZODONE HCL 50 MG PO TABS: 25.0000 mg | ORAL_TABLET | Freq: Every evening | ORAL | Status: DC | PRN

## 2012-06-08 NOTE — Telephone Encounter (Signed)
Spoke to pt to advise results/instructions. Pt understood. Pt stated she will stop taking the phenergan to help her sleep and start using the trazadone, MD Tabori gave verbal order NOT to  send pt phenergan refill per was using for sleep and now out, will discuss at upcoming OV,  Pt noted to send to [facility] on [address], sent new Rx via escribe for trazadone and simvastatin to last until upcoming OV

## 2012-06-27 ENCOUNTER — Ambulatory Visit (INDEPENDENT_AMBULATORY_CARE_PROVIDER_SITE_OTHER): Payer: Medicare Other | Admitting: Family Medicine

## 2012-06-27 ENCOUNTER — Other Ambulatory Visit (HOSPITAL_COMMUNITY)
Admission: RE | Admit: 2012-06-27 | Discharge: 2012-06-27 | Disposition: A | Discharge status: Home / Self Care | Payer: Medicare Other | Source: Ambulatory Visit | Attending: Family Medicine | Admitting: Family Medicine

## 2012-06-27 ENCOUNTER — Encounter: Payer: Self-pay | Admitting: Family Medicine

## 2012-06-27 ENCOUNTER — Telehealth: Payer: Self-pay | Admitting: Family Medicine

## 2012-06-27 VITALS — BP 128/78 | HR 74 | Temp 98.4°F | Ht 65.0 in | Wt 178.0 lb

## 2012-06-27 DIAGNOSIS — Z23 Encounter for immunization: Secondary | ICD-10-CM

## 2012-06-27 DIAGNOSIS — E785 Hyperlipidemia, unspecified: Secondary | ICD-10-CM

## 2012-06-27 DIAGNOSIS — Z01419 Encounter for gynecological examination (general) (routine) without abnormal findings: Secondary | ICD-10-CM | POA: Insufficient documentation

## 2012-06-27 DIAGNOSIS — Z124 Encounter for screening for malignant neoplasm of cervix: Secondary | ICD-10-CM | POA: Insufficient documentation

## 2012-06-27 LAB — HEPATIC FUNCTION PANEL
AST: 33 U/L (ref 0–37)
Alkaline Phosphatase: 36 U/L — ABNORMAL LOW (ref 39–117)
Total Bilirubin: 0.4 mg/dL (ref 0.3–1.2)

## 2012-06-27 LAB — LIPID PANEL
LDL Cholesterol: 63 mg/dL (ref 0–99)
Total CHOL/HDL Ratio: 4
VLDL: 34.2 mg/dL (ref 0.0–40.0)

## 2012-06-27 MED ORDER — TIZANIDINE HCL 4 MG PO TABS: 4.0000 mg | ORAL_TABLET | Freq: Three times a day (TID) | ORAL | Status: DC | PRN

## 2012-06-27 MED ORDER — PROMETHAZINE HCL 12.5 MG PO TABS: 12.5000 mg | ORAL_TABLET | Freq: Three times a day (TID) | ORAL | Status: DC | PRN

## 2012-06-27 NOTE — Patient Instructions (Addendum)
Schedule your physical for May You look good! We'll notify you of your lab results and make any changes if needed Call with any questions or concerns Happy [birth year] Birthday!

## 2012-06-27 NOTE — Progress Notes (Signed)
  Subjective:    Patient ID: Tina Fitzpatrick, female    DOB: [birth year], 61 y.o.   MRN: [id number]  HPI Pap- due today.  UTD on mammo.  Hyperlipidemia- chronic problem, pt started Fenofibrate at last visit.  Due for repeat labs.  Denies abd pain, N/V, myalgias.   Review of Systems For ROS see HPI     Objective:   Physical Exam  Vitals reviewed. Constitutional: She appears well-developed and well-nourished. No distress.  Pulmonary/Chest: Right breast exhibits no inverted nipple, no mass, no nipple discharge, no skin change and no tenderness. Left breast exhibits no inverted nipple, no mass, no nipple discharge, no skin change and no tenderness.  Genitourinary: There is no rash, tenderness or lesion on the right labia. There is no rash, tenderness or lesion on the left labia. Uterus is not deviated, not enlarged, not fixed and not tender. Cervix exhibits no motion tenderness, no discharge and no friability. Right adnexum displays no mass, no tenderness and no fullness. Left adnexum displays no mass, no tenderness and no fullness. No erythema or tenderness around the vagina. No foreign body around the vagina. No vaginal discharge found.       Rectum- normal external appearance          Assessment & Plan:

## 2012-06-27 NOTE — Telephone Encounter (Signed)
Refill: Promethazine 12.5mg tab. Take 1 tablet by mouth every 8 hours as needed for nausea. Qty 30. Last fill 05-10-12 °

## 2012-06-28 MED ORDER — PROMETHAZINE HCL 12.5 MG PO TABS: 12.5000 mg | ORAL_TABLET | Freq: Three times a day (TID) | ORAL | Status: DC | PRN

## 2012-06-28 NOTE — Telephone Encounter (Signed)
rx sent to pharmacy by e-script  

## 2012-07-01 ENCOUNTER — Other Ambulatory Visit: Payer: Self-pay | Admitting: Family Medicine

## 2012-07-01 MED ORDER — FENOFIBRATE 160 MG PO TABS: 160.0000 mg | ORAL_TABLET | Freq: Every day | ORAL | Status: DC

## 2012-07-01 NOTE — Telephone Encounter (Signed)
refill fenofibrate 160mg  tab #30 take one tablet by mouth every day -- last fill 9.17.13--last ov 10.14.13 Annual

## 2012-07-01 NOTE — Telephone Encounter (Signed)
rx sent to pharmacy by e-script  

## 2012-07-04 ENCOUNTER — Other Ambulatory Visit: Payer: Self-pay

## 2012-07-04 MED ORDER — ATENOLOL 50 MG PO TABS: 50.0000 mg | ORAL_TABLET | Freq: Every day | ORAL | Status: DC

## 2012-07-04 NOTE — Telephone Encounter (Signed)
Pt aware Rx sent per pt request.    MW

## 2012-07-05 NOTE — Assessment & Plan Note (Signed)
Chronic problem, due for repeat labs today.  Tolerating meds w/out difficulty.

## 2012-07-05 NOTE — Assessment & Plan Note (Signed)
Pap collected.  Exam WNL 

## 2012-07-20 ENCOUNTER — Telehealth: Payer: Self-pay | Admitting: Family Medicine

## 2012-07-20 MED ORDER — TRAZODONE HCL 50 MG PO TABS: 25.0000 mg | ORAL_TABLET | Freq: Every evening | ORAL | Status: DC | PRN

## 2012-07-20 MED ORDER — SIMVASTATIN 40 MG PO TABS: 40.0000 mg | ORAL_TABLET | Freq: Every evening | ORAL | Status: DC

## 2012-07-20 NOTE — Telephone Encounter (Signed)
Refill: Simvastatin 40 mg tab. Take one tablet by mouth every evening. Qty 30. Last fill 07-01-12

## 2012-07-20 NOTE — Telephone Encounter (Signed)
Please advise/route

## 2012-07-20 NOTE — Telephone Encounter (Signed)
Ok for #30, 6 months

## 2012-07-20 NOTE — Telephone Encounter (Signed)
refill Desyrel 50MG  tab #30 Take one-half to one tablet by mouth at bedtime as needed for sleep last fill 10.18.13--last ov 10.14.13 Ann.Exam

## 2012-07-20 NOTE — Telephone Encounter (Signed)
Med filled.  

## 2012-07-20 NOTE — Telephone Encounter (Signed)
Rx filled

## 2012-08-15 ENCOUNTER — Other Ambulatory Visit: Payer: Self-pay | Admitting: Family Medicine

## 2012-08-15 MED ORDER — TIZANIDINE HCL 4 MG PO TABS: 4.0000 mg | ORAL_TABLET | Freq: Three times a day (TID) | ORAL | Status: DC | PRN

## 2012-08-15 MED ORDER — LISINOPRIL 5 MG PO TABS: ORAL_TABLET | ORAL | Status: DC

## 2012-08-15 MED ORDER — PROMETHAZINE HCL 12.5 MG PO TABS: 12.5000 mg | ORAL_TABLET | Freq: Three times a day (TID) | ORAL | Status: DC | PRN

## 2012-08-15 NOTE — Telephone Encounter (Signed)
Last OV 06-27-12,Last filled 06-28-12 #30

## 2012-08-15 NOTE — Telephone Encounter (Signed)
Okay Zanaflex #90, no refills Promethazine #30 no refills lisinopril #30 and 6 refills

## 2012-08-15 NOTE — Telephone Encounter (Signed)
PROMETHAZINE 12.5 MG TAB QTY : 30 LAST FILL 11.5.13 TAKE 1 TABLET BY MOUTH EVERY 8 HOURS AS NEEDED FOR NAUSEA,   LISINOPRIL 5 MG TABET QTY:30 LAST FILL 11.10.13 TAKE ONE TABLET BY MOUTH EVERY DAY  ZANAFLEX  MG TABLET QTY: 90 LAST FILL: 11.10.132 TAKE ONE TABLET BY MOUTH THREE TIME DAILY AS NEEDED

## 2012-08-15 NOTE — Telephone Encounter (Signed)
Rx sent 

## 2012-09-13 ENCOUNTER — Other Ambulatory Visit: Payer: Self-pay | Admitting: Internal Medicine

## 2012-09-13 NOTE — Telephone Encounter (Signed)
Refill: Trazodone 50 mg tab. Take one-half to one tablet by mouth at bedtime as needed for sleep. Qty 30. Last fill 08-16-12

## 2012-09-14 MED ORDER — TRAZODONE HCL 50 MG PO TABS
25.0000 mg | ORAL_TABLET | Freq: Every evening | ORAL | Status: DC | PRN
Start: 2012-09-13 — End: 2012-11-08

## 2012-10-07 ENCOUNTER — Other Ambulatory Visit: Payer: Self-pay | Admitting: Family Medicine

## 2012-10-07 ENCOUNTER — Other Ambulatory Visit: Payer: Self-pay | Admitting: Internal Medicine

## 2012-10-07 NOTE — Telephone Encounter (Signed)
Last OV 09-13-12 #90, last filled 06-27-12

## 2012-10-07 NOTE — Telephone Encounter (Signed)
Refill done.  

## 2012-11-08 ENCOUNTER — Other Ambulatory Visit: Payer: Self-pay | Admitting: Family Medicine

## 2012-11-08 NOTE — Telephone Encounter (Signed)
Please advise on RF request.  Last CPE;02-10-12 and has future appt on:02-13-13.//AB/CMA

## 2012-11-30 ENCOUNTER — Other Ambulatory Visit: Payer: Self-pay | Admitting: Family Medicine

## 2012-11-30 NOTE — Telephone Encounter (Signed)
Last CPE:02-10-12,and last RF:11-09-12.  Please advise.//AB/CMA

## 2012-12-26 ENCOUNTER — Other Ambulatory Visit: Payer: Self-pay | Admitting: Family Medicine

## 2012-12-28 NOTE — Telephone Encounter (Signed)
Last refill for the Trazodone 50mg :(12-02-12)                            Phenergan 12.5mg :(11-09-12)                            Tizanidine 4mg :(11-09-12)  Last OV:06-27-12,but has an appt scheduled for:02-13-13.  Please advise.//AB/CMA

## 2013-01-19 ENCOUNTER — Other Ambulatory Visit: Payer: Self-pay | Admitting: Family Medicine

## 2013-01-19 NOTE — Telephone Encounter (Signed)
Last seen 06/27/12 and filled 12/27/11#30. Please advise    KP

## 2013-01-20 NOTE — Telephone Encounter (Signed)
Rx sent to the pharmacy by e-script.//AB/CMA 

## 2013-02-13 ENCOUNTER — Encounter: Payer: Medicare Other | Admitting: Family Medicine

## 2013-02-17 ENCOUNTER — Other Ambulatory Visit: Payer: Self-pay | Admitting: Family Medicine

## 2013-02-17 NOTE — Telephone Encounter (Signed)
meds filled for 30 days.

## 2013-02-19 ENCOUNTER — Other Ambulatory Visit: Payer: Self-pay | Admitting: Family Medicine

## 2013-02-20 NOTE — Telephone Encounter (Signed)
Both refill request was done on 02-17-13.//AB/CMA

## 2013-03-01 ENCOUNTER — Telehealth: Payer: Self-pay | Admitting: *Deleted

## 2013-03-01 MED ORDER — PROMETHAZINE HCL 12.5 MG PO TABS: 12.5000 mg | ORAL_TABLET | Freq: Three times a day (TID) | ORAL | Status: DC | PRN

## 2013-03-01 NOTE — Telephone Encounter (Signed)
Ok for #30 (has CPE scheduled)

## 2013-03-01 NOTE — Telephone Encounter (Signed)
Rx sent to the pharmacy by e-script.//AB/CMA 

## 2013-03-01 NOTE — Telephone Encounter (Signed)
Last OV 06-27-12, last filled 12-26-12 #30

## 2013-03-14 ENCOUNTER — Other Ambulatory Visit: Payer: Self-pay | Admitting: Family Medicine

## 2013-03-16 NOTE — Telephone Encounter (Signed)
Ok to refill? Last OV 10.14.13 Last filled 6.6.14

## 2013-03-22 ENCOUNTER — Other Ambulatory Visit: Payer: Self-pay | Admitting: Internal Medicine

## 2013-03-22 ENCOUNTER — Other Ambulatory Visit: Payer: Self-pay | Admitting: Family Medicine

## 2013-03-31 NOTE — Telephone Encounter (Signed)
Refill done.  

## 2013-04-05 ENCOUNTER — Encounter: Payer: Self-pay | Admitting: Lab

## 2013-04-06 ENCOUNTER — Encounter: Payer: Medicare Other | Admitting: Family Medicine

## 2013-04-10 ENCOUNTER — Other Ambulatory Visit: Payer: Self-pay | Admitting: Family Medicine

## 2013-04-11 ENCOUNTER — Other Ambulatory Visit: Payer: Self-pay | Admitting: *Deleted

## 2013-04-11 MED ORDER — OMEPRAZOLE 40 MG PO CPDR: DELAYED_RELEASE_CAPSULE | ORAL | Status: DC

## 2013-04-11 MED ORDER — ATENOLOL 50 MG PO TABS: ORAL_TABLET | ORAL | Status: DC

## 2013-04-11 NOTE — Telephone Encounter (Signed)
Rx has been filled for 30 day supply with 0 refills patient has appointment 04/25/13.

## 2013-04-13 NOTE — Telephone Encounter (Signed)
Rx sent to the pharmacy by e-script with note stating that the pt is due OV.  Mailed letter to pt informing her she is due an CPE with fasting labs.//AB/CMA

## 2013-04-17 ENCOUNTER — Other Ambulatory Visit: Payer: Self-pay | Admitting: Family Medicine

## 2013-04-19 ENCOUNTER — Other Ambulatory Visit: Payer: Self-pay | Admitting: *Deleted

## 2013-04-19 ENCOUNTER — Telehealth: Payer: Self-pay | Admitting: *Deleted

## 2013-04-19 NOTE — Telephone Encounter (Signed)
Patient is due for a 6 mo follow up. Please offer her an apt. Tabori patient         KP

## 2013-04-19 NOTE — Telephone Encounter (Signed)
Last refill:03-14-13 Last OV:06-27-12 Please advise.//AB/CMA

## 2013-04-19 NOTE — Telephone Encounter (Signed)
#  30;get UDS

## 2013-04-19 NOTE — Telephone Encounter (Signed)
error 

## 2013-04-19 NOTE — Telephone Encounter (Signed)
Pharmacy is requesting a refill for a trazodone. Last ov 06/27/12 last date filled 12/26/12 #90 no refills patient does not have a controlled substance contract on file and no UDS.  This is a Dr. Laury Axon patient  Please Advise.

## 2013-04-20 ENCOUNTER — Other Ambulatory Visit: Payer: Self-pay | Admitting: Family Medicine

## 2013-04-20 NOTE — Telephone Encounter (Signed)
I have called patient and explained that she must come in for a follow-up appointment before any prescription can be refilled.   Patient stated that she will call and schedule at her earliest convenience.  AG cma

## 2013-04-20 NOTE — Telephone Encounter (Signed)
Rx was denied pt needs OV.  Pt has been informed that she is due an OV.//AB/CMA

## 2013-04-21 ENCOUNTER — Telehealth: Payer: Self-pay

## 2013-04-21 MED ORDER — TRAZODONE HCL 50 MG PO TABS: ORAL_TABLET | ORAL | Status: DC

## 2013-04-21 MED ORDER — PROMETHAZINE HCL 12.5 MG PO TABS: 12.5000 mg | ORAL_TABLET | Freq: Three times a day (TID) | ORAL | Status: DC | PRN

## 2013-04-21 NOTE — Addendum Note (Signed)
Addended by: Arnette Norris on: 04/21/2013 04:16 PM   Modules accepted: Orders

## 2013-04-21 NOTE — Telephone Encounter (Signed)
Phenergan Rf request. Last filled 03/01/13 #30 and seem 06/27/12. Please advise     KP

## 2013-04-21 NOTE — Telephone Encounter (Signed)
Refill x1 

## 2013-04-21 NOTE — Telephone Encounter (Addendum)
Rx printed along with contract and patient aware she will need a UDS, she will pick up med's on Monday.       KP

## 2013-04-25 ENCOUNTER — Encounter: Payer: Medicare Other | Admitting: Family Medicine

## 2013-04-27 ENCOUNTER — Other Ambulatory Visit: Payer: Self-pay | Admitting: Family Medicine

## 2013-05-01 NOTE — Telephone Encounter (Signed)
Med filled. Pt sue for an OV before any more refills.

## 2013-05-09 ENCOUNTER — Encounter: Payer: Self-pay | Admitting: Family Medicine

## 2013-05-09 ENCOUNTER — Other Ambulatory Visit: Payer: Self-pay | Admitting: Family Medicine

## 2013-05-09 NOTE — Telephone Encounter (Signed)
Med filled pt called to schedule appt.

## 2013-05-26 ENCOUNTER — Other Ambulatory Visit: Payer: Self-pay | Admitting: Internal Medicine

## 2013-05-26 ENCOUNTER — Other Ambulatory Visit: Payer: Self-pay | Admitting: Family Medicine

## 2013-05-26 NOTE — Telephone Encounter (Signed)
Med filled.  

## 2013-05-29 ENCOUNTER — Other Ambulatory Visit: Payer: Self-pay | Admitting: Internal Medicine

## 2013-05-29 NOTE — Telephone Encounter (Signed)
traZODone (DESYREL) 50 MG tablet Last OV 06-27-12 Med last filled 04-21-13 #30 with 0  CPE on 07-05-13.

## 2013-05-29 NOTE — Telephone Encounter (Signed)
Med filled.  

## 2013-06-04 ENCOUNTER — Other Ambulatory Visit: Payer: Self-pay | Admitting: Family Medicine

## 2013-06-06 NOTE — Telephone Encounter (Signed)
Rx filled for 1 month. Pt has an appointment on 07/10/13. SW, CMA

## 2013-06-22 ENCOUNTER — Encounter: Payer: Self-pay | Admitting: Family Medicine

## 2013-06-22 ENCOUNTER — Ambulatory Visit (INDEPENDENT_AMBULATORY_CARE_PROVIDER_SITE_OTHER): Payer: Medicare Other | Admitting: Family Medicine

## 2013-06-22 VITALS — BP 110/80 | HR 62 | Temp 98.1°F | Resp 16 | Wt 161.0 lb

## 2013-06-22 DIAGNOSIS — I10 Essential (primary) hypertension: Secondary | ICD-10-CM

## 2013-06-22 DIAGNOSIS — E785 Hyperlipidemia, unspecified: Secondary | ICD-10-CM

## 2013-06-22 DIAGNOSIS — M542 Cervicalgia: Secondary | ICD-10-CM

## 2013-06-22 DIAGNOSIS — R55 Syncope and collapse: Secondary | ICD-10-CM

## 2013-06-22 DIAGNOSIS — Z23 Encounter for immunization: Secondary | ICD-10-CM

## 2013-06-22 MED ORDER — CYCLOBENZAPRINE HCL 10 MG PO TABS: 10.0000 mg | ORAL_TABLET | Freq: Three times a day (TID) | ORAL | Status: DC | PRN

## 2013-06-22 MED ORDER — NAPROXEN 500 MG PO TABS: 500.0000 mg | ORAL_TABLET | Freq: Two times a day (BID) | ORAL | Status: DC

## 2013-06-22 NOTE — Patient Instructions (Signed)
We'll call you with your cardiology and neuro appts We'll notify you of your lab results Make sure you are eating regularly DO NOT DRIVE! If you pass out again, please go to the ER Use the Flexeril at night for muscle spasm Take the Naproxen twice daily for inflammation- take w/ food Call with any questions or concerns Hang in there!!

## 2013-06-22 NOTE — Assessment & Plan Note (Signed)
New.  Not orthostatic in exam room.  EKG WNL.  Not having prodromal sxs that would be consistent w/ hypoglycemia or vagal response.  Check labs.  Refer to both neuro and cards.  Reviewed supportive care and red flags that should prompt return.  Pt expressed understanding and is in agreement w/ plan.

## 2013-06-22 NOTE — Assessment & Plan Note (Signed)
New.  Occurred s/p fall.  Start scheduled NSAIDs.  Flexeril prn.  Reviewed supportive care and red flags that should prompt return.  Pt expressed understanding and is in agreement w/ plan.

## 2013-06-22 NOTE — Progress Notes (Signed)
  Subjective:    Patient ID: Tina Fitzpatrick, female    DOB: [birth year], 62 y.o.   MRN: [id number]  HPI Syncope- first occurred 3 weeks ago.  3 separate episodes.  No recollection of passing out.  Husband heard pt fall for 1st 2 episodes and pt was awake and talking by the time he got to her.  Pt was appropriate immediately after episode.  Most recent episode was Sunday- pt went forward, husband grabbed her.  This episode was 5 minutes unconsciousness, pt was oriented upon waking.  No bowel or bladder incontinence.  Pt reports poor food intake but good fluid intake (Sprite, Sweet Tea)  Denies preceding symptoms- no dizziness, nausea, shaky feelings.  L sided neck pain since falling the 3rd time.  Pain w/ turning a particular way.   Review of Systems For ROS see HPI     Objective:   Physical Exam  Vitals reviewed. Constitutional: She is oriented to person, place, and time. She appears well-developed and well-nourished. No distress.  HENT:  Head: Normocephalic and atraumatic.  Eyes: Conjunctivae and EOM are normal. Pupils are equal, round, and reactive to light.  Neck: Normal range of motion. Neck supple. No thyromegaly present.  Cardiovascular: Normal rate, regular rhythm, normal heart sounds and intact distal pulses.   No murmur heard. Pulmonary/Chest: Effort normal and breath sounds normal. No respiratory distress.  Abdominal: Soft. She exhibits no distension. There is no tenderness.  Musculoskeletal: She exhibits no edema.  Lymphadenopathy:    She has no cervical adenopathy.  Neurological: She is alert and oriented to person, place, and time.  Skin: Skin is warm and dry.  Psychiatric: She has a normal mood and affect. Her behavior is normal.          Assessment & Plan:

## 2013-06-23 LAB — CBC WITH DIFFERENTIAL/PLATELET
Basophils Absolute: 0 10*3/uL (ref 0.0–0.1)
Basophils Relative: 0.3 % (ref 0.0–3.0)
Eosinophils Absolute: 0.2 10*3/uL (ref 0.0–0.7)
Hemoglobin: 13 g/dL (ref 12.0–15.0)
Lymphocytes Relative: 26.9 % (ref 12.0–46.0)
Lymphs Abs: 2.6 10*3/uL (ref 0.7–4.0)
MCHC: 34.2 g/dL (ref 30.0–36.0)
MCV: 86.6 fl (ref 78.0–100.0)
Monocytes Absolute: 0.3 10*3/uL (ref 0.1–1.0)
Neutro Abs: 6.5 10*3/uL (ref 1.4–7.7)
RBC: 4.4 Mil/uL (ref 3.87–5.11)
RDW: 13.7 % (ref 11.5–14.6)

## 2013-06-23 LAB — TSH: TSH: 2.28 u[IU]/mL (ref 0.35–5.50)

## 2013-06-23 LAB — BASIC METABOLIC PANEL
BUN: 19 mg/dL (ref 6–23)
Calcium: 10.3 mg/dL (ref 8.4–10.5)
Chloride: 104 mEq/L (ref 96–112)
Creatinine, Ser: 1.2 mg/dL (ref 0.4–1.2)
GFR: 50.48 mL/min — ABNORMAL LOW (ref >60.00)

## 2013-06-23 NOTE — Progress Notes (Signed)
Called pt and LMOVM to return call.  °

## 2013-07-05 ENCOUNTER — Telehealth: Payer: Self-pay

## 2013-07-05 ENCOUNTER — Encounter: Payer: Medicare Other | Admitting: Family Medicine

## 2013-07-05 ENCOUNTER — Ambulatory Visit: Payer: Medicare Other | Admitting: Neurology

## 2013-07-05 NOTE — Telephone Encounter (Signed)
Medication and allergies: reviewed and updated  90 day supply/mail order: na [facility]   Immunizations due: Zostavax   A/P:   No changes to [facility]--the order was placed 02/2012 but expired. No records in chart of patient getting colonoscopy.  Patient recalls talking with MD about procedure, thought she must have had performed.  MMG--last 5/201--neg Pap--last 06/2012--neg  To Discuss with Provider: Recent falls--has not had one since her last OV

## 2013-07-06 ENCOUNTER — Ambulatory Visit: Payer: Medicare Other | Admitting: Neurology

## 2013-07-10 ENCOUNTER — Encounter: Payer: Medicare Other | Admitting: Family Medicine

## 2013-07-11 ENCOUNTER — Ambulatory Visit: Payer: Medicare Other | Admitting: Cardiology

## 2013-07-17 ENCOUNTER — Other Ambulatory Visit: Payer: Self-pay | Admitting: Family Medicine

## 2013-07-17 ENCOUNTER — Ambulatory Visit: Payer: Medicare Other | Admitting: Neurology

## 2013-07-17 NOTE — Telephone Encounter (Signed)
Med filled.  

## 2013-07-18 ENCOUNTER — Other Ambulatory Visit: Payer: Self-pay | Admitting: Family Medicine

## 2013-07-18 NOTE — Telephone Encounter (Signed)
Med filled.  

## 2013-07-20 ENCOUNTER — Other Ambulatory Visit: Payer: Self-pay | Admitting: Family Medicine

## 2013-07-20 NOTE — Telephone Encounter (Signed)
Med filled.  

## 2013-07-21 ENCOUNTER — Other Ambulatory Visit: Payer: Self-pay | Admitting: Family Medicine

## 2013-07-21 NOTE — Telephone Encounter (Signed)
Last seen 06/22/13 and filled 04/21/13 #30. Please advise     KP

## 2013-08-01 ENCOUNTER — Ambulatory Visit: Payer: Medicare Other | Admitting: Neurology

## 2013-08-02 ENCOUNTER — Ambulatory Visit: Payer: Medicare Other | Admitting: Cardiology

## 2013-08-08 ENCOUNTER — Other Ambulatory Visit: Payer: Self-pay | Admitting: Family Medicine

## 2013-08-08 NOTE — Telephone Encounter (Signed)
Med filled.  

## 2013-08-09 ENCOUNTER — Other Ambulatory Visit: Payer: Self-pay | Admitting: Family Medicine

## 2013-08-09 NOTE — Telephone Encounter (Signed)
This med was denied due to it being filled on 11/25.

## 2013-08-14 ENCOUNTER — Other Ambulatory Visit: Payer: Self-pay | Admitting: Family Medicine

## 2013-08-14 NOTE — Telephone Encounter (Signed)
Per tabori med refused. Pt needs follow up appt.

## 2013-08-14 NOTE — Telephone Encounter (Signed)
Last ov 10-9 (syncope) Med filled 11-7 #30 with 0

## 2013-08-15 ENCOUNTER — Other Ambulatory Visit: Payer: Self-pay | Admitting: Family Medicine

## 2013-08-15 NOTE — Telephone Encounter (Signed)
Med denied. Pt needs an appt.  

## 2013-08-16 ENCOUNTER — Other Ambulatory Visit: Payer: Self-pay | Admitting: Family Medicine

## 2013-08-16 NOTE — Telephone Encounter (Signed)
Med filled.  

## 2013-08-20 ENCOUNTER — Other Ambulatory Visit: Payer: Self-pay | Admitting: Family Medicine

## 2013-08-22 NOTE — Telephone Encounter (Signed)
Rx's sent to the pharmacy by e-script.   Note sent to the pharmacy that the pt is due an office visit for cholest and BP check.//AB/CMA

## 2013-09-17 ENCOUNTER — Other Ambulatory Visit: Payer: Self-pay | Admitting: Family Medicine

## 2013-09-20 NOTE — Telephone Encounter (Signed)
Last OV 06-22-13 Flexeril- 07-18-13 #30 with 0 Phenergan- 08-22-13 #30 with 0 Trazodone- 08-08-13 #30 with 0 Zanaflex- 12-16-12 #90 with 0  Low Risk

## 2013-09-20 NOTE — Telephone Encounter (Signed)
Called pt and LM with spouse to return call.  

## 2013-09-20 NOTE — Telephone Encounter (Signed)
Ok for #30 of Trazodone No refills on phenergan- this is not a maintenance med Needs to pick Flexeril or Zanaflex- both are muscle relaxers, can only have 30 of the one she chooses No additional refills on anything until OV

## 2013-09-22 ENCOUNTER — Other Ambulatory Visit: Payer: Self-pay | Admitting: General Practice

## 2013-09-22 MED ORDER — OMEPRAZOLE 40 MG PO CPDR: DELAYED_RELEASE_CAPSULE | ORAL | Status: DC

## 2013-09-22 MED ORDER — CYCLOBENZAPRINE HCL 10 MG PO TABS: ORAL_TABLET | ORAL | Status: DC

## 2013-09-22 MED ORDER — TIZANIDINE HCL 4 MG PO TABS: ORAL_TABLET | ORAL | Status: DC

## 2013-09-25 NOTE — Telephone Encounter (Signed)
Pt called back stated she wants the trazodone and zanaflex. Also scheduled an appt for Friday.

## 2013-09-25 NOTE — Telephone Encounter (Signed)
Called and left message for pt on both numbers listed.

## 2013-09-25 NOTE — Telephone Encounter (Signed)
Trazodone and zanaflex filled.

## 2013-09-25 NOTE — Telephone Encounter (Signed)
Please try pt again

## 2013-10-05 ENCOUNTER — Ambulatory Visit: Payer: Medicare Other | Admitting: Family Medicine

## 2013-10-05 DIAGNOSIS — Z0289 Encounter for other administrative examinations: Secondary | ICD-10-CM

## 2013-10-10 ENCOUNTER — Other Ambulatory Visit: Payer: Self-pay | Admitting: Family Medicine

## 2013-10-11 NOTE — Telephone Encounter (Signed)
Atenolol, Napraxen, and Simvastatin refilled per protocol. JG//CMA

## 2013-11-06 ENCOUNTER — Other Ambulatory Visit: Payer: Self-pay | Admitting: Family Medicine

## 2013-11-07 NOTE — Telephone Encounter (Signed)
Med filled.  

## 2013-11-09 ENCOUNTER — Other Ambulatory Visit: Payer: Self-pay | Admitting: Family Medicine

## 2013-11-10 NOTE — Telephone Encounter (Signed)
Med filled.  

## 2013-11-29 ENCOUNTER — Other Ambulatory Visit: Payer: Self-pay | Admitting: Family Medicine

## 2013-11-29 NOTE — Telephone Encounter (Signed)
Med filled.  

## 2014-01-05 ENCOUNTER — Other Ambulatory Visit: Payer: Self-pay | Admitting: Family Medicine

## 2014-01-05 NOTE — Telephone Encounter (Signed)
Med filled.  

## 2014-01-10 ENCOUNTER — Telehealth: Payer: Self-pay | Admitting: Family Medicine

## 2014-01-10 DIAGNOSIS — R55 Syncope and collapse: Secondary | ICD-10-CM

## 2014-01-10 NOTE — Telephone Encounter (Signed)
Caller name: [name] Relation to pt: Call back [phone] Pharmacy:  Reason for call:  Needs referral to Dr. Ricka BurdockJeffe again because she missed her last apt that was scheduled.

## 2014-01-10 NOTE — Telephone Encounter (Signed)
I cannot enter new referrals. These must be generated by the provider. Dr. Beverely Lowabori, please enter new referral, if appropriate. Thanks.

## 2014-01-10 NOTE — Telephone Encounter (Signed)
Referral placed.

## 2014-01-17 ENCOUNTER — Encounter: Payer: Medicare Other | Admitting: Family Medicine

## 2014-01-17 NOTE — Telephone Encounter (Signed)
Per [facility], patient has canceled with Dr. Everlena CooperJaffe 4 times and cannot be rescheduled with him at this time. [facility]. Left message for patient to call office.

## 2014-01-18 ENCOUNTER — Telehealth: Payer: Self-pay | Admitting: Family Medicine

## 2014-01-18 NOTE — Telephone Encounter (Signed)
Caller name: [name] Relation to pt: self Call back number: [phone]   Reason for call:  FYI:::: Pt called to let Dr. Beverely Lowabori know that she has an upcoming apt [facility].

## 2014-01-25 ENCOUNTER — Ambulatory Visit: Payer: Self-pay | Admitting: Neurology

## 2014-01-31 ENCOUNTER — Ambulatory Visit (INDEPENDENT_AMBULATORY_CARE_PROVIDER_SITE_OTHER): Payer: Medicare Other | Admitting: Neurology

## 2014-01-31 ENCOUNTER — Encounter: Payer: Self-pay | Admitting: Neurology

## 2014-01-31 ENCOUNTER — Ambulatory Visit: Payer: Medicare Other | Admitting: Cardiology

## 2014-01-31 ENCOUNTER — Encounter (INDEPENDENT_AMBULATORY_CARE_PROVIDER_SITE_OTHER): Payer: Self-pay

## 2014-01-31 VITALS — BP 141/84 | HR 112 | Ht 65.75 in | Wt 187.0 lb

## 2014-01-31 DIAGNOSIS — R55 Syncope and collapse: Secondary | ICD-10-CM

## 2014-01-31 NOTE — Progress Notes (Signed)
[facility]    Provider:  Dr Hosie PoissonSumner Referring Provider: Sheliah Hatchabori, Katherine E, MD Primary Care Physician:  Neena RhymesKatherine Tabori, MD  CC:  Loss of consciousness  HPI:  Lambert ModyJanet L Fitzpatrick is a 63 y.o. female here as a referral from Dr. Beverely Lowabori for evaluation of syncopal episodes  She reports having 3 episodes of passing out in October 2014. She reports recalling passing out during the first episodes, does not recall anything from the last two. During the first episode recalls standing up, falling back but does not recall hitting the ground, next thing she recalls is her husband standing over her. For the other 2 episodes she only recalls waking up on the floor. Her husband witnessed 2 of the events, denies any extremity shaking, reports she just went limit. Duration of events is unclear as patient is alone for office visit. (PCP notes mention 5 minutes) Reports being alert upon waking up. Prior to episodes no change in vision, no light headed sensation, no palpitations, no diaphoresis. No tongue biting, no loss of bowel/bladder. No episodes since then. Denies any light headed sensation when standing. No history of heart problems. No history of head trauma. No seizures. No episodes since October 2014.   She has not followed up with cardiology, states she missed her appointment and never called to reschedule   PCP notes 06/2013: Syncope- first occurred 3 weeks ago. 3 separate episodes. No recollection of passing out. Husband heard pt fall for 1st 2 episodes and pt was awake and talking by the time he got to her. Pt was appropriate immediately after episode. Most recent episode was Sunday- pt went forward, husband grabbed her. This episode was 5 minutes unconsciousness, pt was oriented upon waking. No bowel or bladder incontinence. Pt reports poor food intake but good fluid intake (Sprite, Sweet Tea) Denies preceding symptoms- no dizziness, nausea, shaky feelings. L sided neck pain since  falling the 3rd time. Pain w/ turning a particular way.  Review of Systems: Out of a complete 14 system review, the patient complains of only the following symptoms, and all other reviewed systems are negative. + weight gain, headache, passing out  History   Social History  . Marital Status: Married    Spouse Name: N/A    Number of Children: N/A  . Years of Education: N/A   Occupational History  . Not on file.   Social History Main Topics  . Smoking status: Never Smoker   . Smokeless tobacco: Not on file  . Alcohol Use: No  . Drug Use: No  . Sexual Activity: Not on file   Other Topics Concern  . Not on file   Social History Narrative   Married with 2 children   Right handed    12 th grade    1 cup daily    No family history on file.  Past Medical History  Diagnosis Date  . GERD (gastroesophageal reflux disease)   . Hypertension   . Hyperlipidemia   . Depression   . DDD (degenerative disc disease), cervical   . Kidney stones     Past Surgical History  Procedure Laterality Date  . Neck surgery  2008  . Kidney stones  1988    Current Outpatient Prescriptions  Medication Sig Dispense Refill  . ALPRAZolam (XANAX) 0.5 MG tablet Take 0.5 mg by mouth every 6 (six) hours as needed.      Marland Kitchen. amitriptyline (ELAVIL) 10 MG tablet Take 20 mg by mouth at bedtime as needed.      .Marland Kitchen  aspirin 81 MG tablet Take 81 mg by mouth daily.      Marland Kitchen atenolol (TENORMIN) 50 MG tablet TAKE ONE TABLET BY MOUTH ONCE DAILY  30 tablet  5  . cyclobenzaprine (FLEXERIL) 10 MG tablet TAKE ONE TABLET BY MOUTH THREE TIMES DAILY AS NEEDED FOR MUSCLE SPASM  30 tablet  1  . fenofibrate 160 MG tablet TAKE ONE TABLET BY MOUTH ONCE DAILY--PT NEEDS OFFICE VISIT FOR FURTHER REFILLS  30 tablet  3  . lisinopril (PRINIVIL,ZESTRIL) 5 MG tablet TAKE ONE TABLET BY MOUTH ONCE DAILY  30 tablet  3  . naproxen (NAPROSYN) 500 MG tablet TAKE ONE TABLET BY MOUTH TWICE DAILY WITH MEALS  60 tablet  5  . omeprazole  (PRILOSEC) 40 MG capsule TAKE ONE CAPSULE BY MOUTH ONCE DAILY  30 capsule  5  . promethazine (PHENERGAN) 12.5 MG tablet PT NEEDS OFFICE VISIT.  Take one tablet by mouth every 8 hours as needed for nausea.  30 tablet  0  . sertraline (ZOLOFT) 100 MG tablet Take 100 mg by mouth 2 (two) times daily.      Marland Kitchen tiZANidine (ZANAFLEX) 4 MG tablet TAKE ONE TABLET BY MOUTH THREE TIMES DAILY AS NEEDED  90 tablet  1  . traMADol (ULTRAM) 50 MG tablet TAKE ONE TABLET BY MOUTH EVERY 6 HOURS AS NEEDED FOR PAIN  90 tablet  3  . traZODone (DESYREL) 50 MG tablet TAKE ONE-HALF TABLET BY MOUTH AT BEDTIME AS NEEDED FOR SLEEP  30 tablet  0   No current facility-administered medications for this visit.    Allergies as of 01/31/2014  . (No Known Allergies)    Vitals: BP 141/84  Pulse 112  Ht 5' 5.75" (1.67 m)  Wt 187 lb (84.823 kg)  BMI 30.41 kg/m2 Last Weight:  Wt Readings from Last 1 Encounters:  01/31/14 187 lb (84.823 kg)   Last Height:   Ht Readings from Last 1 Encounters:  01/31/14 5' 5.75" (1.67 m)     Physical exam: Exam: Gen: NAD, conversant Eyes: anicteric sclerae, moist conjunctivae HENT: Atraumatic, oropharynx clear Neck: Trachea midline; supple,  Lungs: CTA, no wheezing, rales, rhonic                          CV: RRR, no MRG Abdomen: Soft, non-tender;  Extremities: No peripheral edema  Skin: Normal temperature, no rash,  Psych: Appropriate affect, pleasant  Neuro: MS: AA&Ox3, appropriately interactive, normal affect   Speech: fluent w/o paraphasic error  Memory: good recent and remote recall  CN: PERRL, EOMI no nystagmus, no ptosis, sensation intact to LT V1-V3 bilat, face symmetric, no weakness, hearing grossly intact, palate elevates symmetrically, shoulder shrug 5/5 bilat,  tongue protrudes midline, no fasiculations noted.  Motor: normal bulk and tone Strength: 5/5  In all extremities  Coord: rapid alternating and point-to-point (FNF, HTS) movements intact.  Reflexes:  symmetrical, bilat downgoing toes  Sens: LT intact in all extremities  Gait: posture, stance, stride and arm-swing normal. Tandem gait intact. Able to walk on heels and toes. Romberg absent.   Assessment:  After physical and neurologic examination, review of laboratory studies, imaging, neurophysiology testing and pre-existing records, assessment will be reviewed on the problem list.  Plan:  Treatment plan and additional workup will be reviewed under Problem List.  1Loss of consciousness  62y/o woman presenting for initial evaluation of multiple episodes of loss of consciousness with last episode occuring in October 2014. Differential would be seizure  vs syncope. She denies any prodromal sensations prior to the event. Duration is unclear but PCP notes one episode lasting around 5 minutes which would make a seizure episode a stronger possibility. No abnormal movements or post ictal state though point more towards a syncopal event. Will check EEG and carotid doppler. Depending on results can consider brain imaging. Counseled patient to follow up with cardiology for further evaluation. Patient will continue to refrain from driving pending her workup. Follow up once workup completed.    Elspeth ChoPeter Erland Vivas, DO  [facility] [address] [place], [place] [id number]  Phone [phone] Fax [phone]

## 2014-01-31 NOTE — Patient Instructions (Signed)
Overall you are doing fairly well but I do want to suggest a few things today:   Remember to drink plenty of fluid, eat healthy meals and do not skip any meals. Try to eat protein with a every meal and eat a healthy snack such as fruit or nuts in between meals. Try to keep a regular sleep-wake schedule and try to exercise daily, particularly in the form of walking, 20-30 minutes a day, if you can.   Please avoid driving while we complete the workup  Please schedule an EEG and carotid doppler test when you check out  Please follow up with cardiology  Follow up once your workup is completed.  Please call us with any interim questions, concerns, problems, updates or refill requests.   Please also call us for any test results so we can go over those with you on the phone.  My clinical assistant and will answer any of your questions and relay your messages to me and also relay most of my messages to you.   Our phone number is [phone]. We also have an after hours call service for urgent matters and there is a physician on-call for urgent questions. For any emergencies you know to call 911 or go to the nearest emergency room

## 2014-02-08 ENCOUNTER — Other Ambulatory Visit: Payer: Self-pay | Admitting: Family Medicine

## 2014-02-08 NOTE — Telephone Encounter (Signed)
Med filled.  

## 2014-02-09 ENCOUNTER — Telehealth: Payer: Self-pay | Admitting: Radiology

## 2014-02-09 ENCOUNTER — Other Ambulatory Visit: Payer: Medicare Other | Admitting: Radiology

## 2014-02-09 NOTE — Telephone Encounter (Signed)
Patient did not show for EEG study.

## 2014-02-13 ENCOUNTER — Other Ambulatory Visit: Payer: Self-pay | Admitting: Family Medicine

## 2014-02-13 NOTE — Telephone Encounter (Signed)
Med filled.  

## 2014-02-14 ENCOUNTER — Other Ambulatory Visit: Payer: Medicare Other

## 2014-02-21 ENCOUNTER — Other Ambulatory Visit: Payer: Self-pay | Admitting: General Practice

## 2014-02-21 MED ORDER — SIMVASTATIN 40 MG PO TABS
ORAL_TABLET | ORAL | Status: DC
Start: 2014-02-21 — End: 2014-05-23

## 2014-02-21 NOTE — Telephone Encounter (Signed)
Med filled.  

## 2014-03-08 ENCOUNTER — Other Ambulatory Visit: Payer: Self-pay | Admitting: Family Medicine

## 2014-03-08 NOTE — Telephone Encounter (Signed)
Med filled.  

## 2014-03-09 ENCOUNTER — Other Ambulatory Visit: Payer: Self-pay | Admitting: Family Medicine

## 2014-03-09 ENCOUNTER — Other Ambulatory Visit: Payer: Medicare Other

## 2014-03-09 NOTE — Telephone Encounter (Signed)
Med filled.  

## 2014-03-12 ENCOUNTER — Ambulatory Visit: Payer: Medicare Other | Admitting: Cardiology

## 2014-04-11 ENCOUNTER — Encounter: Payer: Medicare Other | Admitting: Family Medicine

## 2014-04-12 ENCOUNTER — Other Ambulatory Visit: Payer: Self-pay | Admitting: Family Medicine

## 2014-04-12 NOTE — Telephone Encounter (Signed)
Med filled, pt has CPE next month.

## 2014-04-15 ENCOUNTER — Other Ambulatory Visit: Payer: Self-pay | Admitting: Family Medicine

## 2014-04-16 NOTE — Telephone Encounter (Signed)
Med filled pt has an appt next week.

## 2014-04-20 ENCOUNTER — Encounter: Payer: Medicare Other | Admitting: Family Medicine

## 2014-05-03 ENCOUNTER — Other Ambulatory Visit: Payer: Self-pay | Admitting: Family Medicine

## 2014-05-03 NOTE — Telephone Encounter (Signed)
Ok for #90, overdue for BP and lipid appt- needs to schedule

## 2014-05-03 NOTE — Telephone Encounter (Signed)
Last OV 06-22-13 Med filled 02-13-14 #90 with 0  No upcoming appts.

## 2014-05-03 NOTE — Telephone Encounter (Signed)
Med filled and letter mailed to pt.  

## 2014-05-07 ENCOUNTER — Telehealth: Payer: Self-pay | Admitting: Family Medicine

## 2014-05-07 NOTE — Telephone Encounter (Signed)
LVM requesting pt to call back regarding scheduling appointment

## 2014-05-07 NOTE — Telephone Encounter (Signed)
Pt needs a follow up appt for her depression. Please schedule this appt and we can discuss med refills at that time. Pt has not had active labs in 10 months. Cannot wait until CPE in January.

## 2014-05-07 NOTE — Telephone Encounter (Signed)
Caller name: Leaner  Relation to pt: self  Call back number: [phone] [facility]acy: [facility] [phone]  Reason for call:   pt schedule an annual appointment 09/19/2014 requesting a refill on all medication to hold her over until appointment, pt did not know what medication she needed because its been awhile.

## 2014-05-08 NOTE — Telephone Encounter (Signed)
Pt scheduled appointment for 05/09/14 at 4pm.

## 2014-05-09 ENCOUNTER — Ambulatory Visit: Payer: Medicare Other | Admitting: Family Medicine

## 2014-05-09 DIAGNOSIS — Z0289 Encounter for other administrative examinations: Secondary | ICD-10-CM

## 2014-05-10 ENCOUNTER — Other Ambulatory Visit: Payer: Self-pay | Admitting: Family Medicine

## 2014-05-10 NOTE — Telephone Encounter (Signed)
Med filled.  

## 2014-05-13 ENCOUNTER — Other Ambulatory Visit: Payer: Self-pay | Admitting: Family Medicine

## 2014-05-14 ENCOUNTER — Telehealth: Payer: Self-pay | Admitting: *Deleted

## 2014-05-14 NOTE — Telephone Encounter (Signed)
Office Message [address] [place], [place] [address] p. [phone] f. [phone] To: Lorrin Mais (After Hours Triage) Fax: [phone] From: [facility] Date/ Time: 05/14/2014 7:46 AM Taken By: Adline Potter, CSR Caller: Marylu Lund Facility: home Patient: Tina Fitzpatrick, Tina Fitzpatrick DOB: Feb 05, 1951 Phone: [phone] Reason for Call: follow up appointment - is a patient of Dr Beverely Low Regarding Appointment: Appt Date: Appt Time: Unknown Provider: Reason: Details: Outcome:

## 2014-05-14 NOTE — Telephone Encounter (Signed)
Med filled.  

## 2014-05-23 ENCOUNTER — Other Ambulatory Visit: Payer: Self-pay | Admitting: General Practice

## 2014-05-23 MED ORDER — SIMVASTATIN 40 MG PO TABS: ORAL_TABLET | ORAL | Status: DC

## 2014-05-23 MED ORDER — CYCLOBENZAPRINE HCL 10 MG PO TABS: ORAL_TABLET | ORAL | Status: DC

## 2014-06-18 ENCOUNTER — Telehealth: Payer: Self-pay | Admitting: General Practice

## 2014-06-18 NOTE — Telephone Encounter (Signed)
Last OV 06-22-13, CPE scheduled for 09/2014.  Tizanidine last filled 05/03/14 #90 with 0  Advised before that pt need BP and Cholesterol appt. Please advise if ok.

## 2014-06-19 MED ORDER — TIZANIDINE HCL 4 MG PO TABS: ORAL_TABLET | ORAL | Status: DC

## 2014-06-19 NOTE — Telephone Encounter (Signed)
Med filled.  

## 2014-06-19 NOTE — Telephone Encounter (Signed)
Ok for 90

## 2014-06-26 ENCOUNTER — Other Ambulatory Visit: Payer: Self-pay | Admitting: General Practice

## 2014-06-26 NOTE — Telephone Encounter (Signed)
Last OV 06-22-13 tizanidine last filled med filled 10/6.

## 2014-06-29 ENCOUNTER — Other Ambulatory Visit: Payer: Self-pay | Admitting: Family Medicine

## 2014-06-29 NOTE — Telephone Encounter (Signed)
Last OV 06-22-13 (CPE scheduled for January) Med filled 06-19-14 #90 with 0

## 2014-07-09 ENCOUNTER — Telehealth: Payer: Self-pay | Admitting: Family Medicine

## 2014-07-09 ENCOUNTER — Other Ambulatory Visit: Payer: Self-pay | Admitting: Family Medicine

## 2014-07-09 MED ORDER — OMEPRAZOLE 40 MG PO CPDR: DELAYED_RELEASE_CAPSULE | ORAL | Status: DC

## 2014-07-09 NOTE — Telephone Encounter (Signed)
Med denied, last filled 05-23-14 #30 with 3

## 2014-07-09 NOTE — Telephone Encounter (Signed)
Med filled.  

## 2014-07-09 NOTE — Telephone Encounter (Signed)
Caller name: Lambert ModyJohnson, Navneet L Relation to pt: self  Call back number: [phone]   Reason for call: pt requesting a refill  omeprazole (PRILOSEC) 40 MG capsule

## 2014-07-23 ENCOUNTER — Other Ambulatory Visit: Payer: Self-pay | Admitting: Family Medicine

## 2014-07-23 NOTE — Telephone Encounter (Signed)
Last OV 06-22-13 Tizanidine last filled 06-19-14 #90 with 0  Upcoming CPE on 09-19-13, ok to fill with refills?

## 2014-07-25 ENCOUNTER — Other Ambulatory Visit: Payer: Self-pay | Admitting: Family Medicine

## 2014-07-25 NOTE — Telephone Encounter (Signed)
Med filled.  

## 2014-08-03 ENCOUNTER — Other Ambulatory Visit: Payer: Self-pay | Admitting: Family Medicine

## 2014-08-13 ENCOUNTER — Other Ambulatory Visit: Payer: Self-pay | Admitting: General Practice

## 2014-08-13 MED ORDER — ATENOLOL 50 MG PO TABS: 50.0000 mg | ORAL_TABLET | Freq: Every day | ORAL | Status: DC

## 2014-08-13 MED ORDER — LISINOPRIL 5 MG PO TABS: 5.0000 mg | ORAL_TABLET | Freq: Every day | ORAL | Status: DC

## 2014-08-27 ENCOUNTER — Other Ambulatory Visit: Payer: Self-pay | Admitting: Family Medicine

## 2014-08-27 NOTE — Telephone Encounter (Signed)
Med filled.  

## 2014-08-27 NOTE — Telephone Encounter (Signed)
Last OV 06-22-13 Med filled 08-03-14 #30 with 0  Letter mailed 05-03-14 to schedule an appt., CPE 09-19-14

## 2014-08-29 ENCOUNTER — Other Ambulatory Visit: Payer: Self-pay | Admitting: Family Medicine

## 2014-08-29 NOTE — Telephone Encounter (Signed)
Med denied, already filled on 08-27-14.

## 2014-08-31 ENCOUNTER — Other Ambulatory Visit: Payer: Self-pay | Admitting: Family Medicine

## 2014-08-31 NOTE — Telephone Encounter (Signed)
Med filled.  

## 2014-09-08 ENCOUNTER — Other Ambulatory Visit: Payer: Self-pay | Admitting: Family Medicine

## 2014-09-10 NOTE — Telephone Encounter (Signed)
[facility]Scribe request from [facility] for [facility] on Tizanidine Last filled - 11.09.15, #90x1 Last AEX - 10.09.2014 Next AEX - Not Stated AVS Patient has CPE scheduled for 01.06.15; has not been seen >12 months Denied; pt must keep appointment scheduled, Rx not due until 01.08.16 per provider/SLS

## 2014-09-19 ENCOUNTER — Encounter: Payer: Medicare Other | Admitting: Family Medicine

## 2014-09-25 ENCOUNTER — Other Ambulatory Visit: Payer: Self-pay | Admitting: Family Medicine

## 2014-09-25 NOTE — Telephone Encounter (Signed)
Med denied, pt needs an appt.  

## 2014-09-26 ENCOUNTER — Other Ambulatory Visit: Payer: Self-pay | Admitting: Family Medicine

## 2014-09-26 NOTE — Telephone Encounter (Signed)
Med denied, cannot fill until pt is seen.

## 2014-09-27 ENCOUNTER — Other Ambulatory Visit: Payer: Self-pay | Admitting: General Practice

## 2014-09-27 NOTE — Telephone Encounter (Signed)
Fenofibrate refill denied, pt was last seen 06/2013. Medication last filled 07/05/14. Pt can wait until her appt for further refills.

## 2014-09-29 ENCOUNTER — Other Ambulatory Visit: Payer: Self-pay | Admitting: Family Medicine

## 2014-10-01 NOTE — Telephone Encounter (Signed)
Med filled.  

## 2014-10-02 ENCOUNTER — Other Ambulatory Visit: Payer: Self-pay | Admitting: Family Medicine

## 2014-10-07 ENCOUNTER — Other Ambulatory Visit: Payer: Self-pay | Admitting: Family Medicine

## 2014-10-08 ENCOUNTER — Other Ambulatory Visit: Payer: Self-pay | Admitting: Family Medicine

## 2014-10-08 NOTE — Telephone Encounter (Signed)
Last OV 06-22-13 (syncope) zanaflex last filled 07-23-14 #90 with 1 (Sig has pt taking TID)  I had denied this medication due to pt not being seen, Pt made an CPE appt for 12/2014

## 2014-10-08 NOTE — Telephone Encounter (Signed)
Med denied, until she sees provider.

## 2014-10-09 ENCOUNTER — Other Ambulatory Visit: Payer: Self-pay | Admitting: General Practice

## 2014-10-09 MED ORDER — OMEPRAZOLE 40 MG PO CPDR: DELAYED_RELEASE_CAPSULE | ORAL | Status: DC

## 2014-10-09 MED ORDER — FENOFIBRATE 160 MG PO TABS
160.0000 mg | ORAL_TABLET | Freq: Every day | ORAL | Status: DC
Start: 2014-10-09 — End: 2015-01-18

## 2014-10-10 ENCOUNTER — Telehealth: Payer: Self-pay | Admitting: Family Medicine

## 2014-10-10 DIAGNOSIS — F5104 Psychophysiologic insomnia: Secondary | ICD-10-CM | POA: Diagnosis not present

## 2014-10-10 DIAGNOSIS — F411 Generalized anxiety disorder: Secondary | ICD-10-CM | POA: Diagnosis not present

## 2014-10-10 DIAGNOSIS — G43009 Migraine without aura, not intractable, without status migrainosus: Secondary | ICD-10-CM | POA: Diagnosis not present

## 2014-10-10 NOTE — Telephone Encounter (Signed)
error:[id number] ° °

## 2014-10-15 ENCOUNTER — Other Ambulatory Visit: Payer: Self-pay | Admitting: Family Medicine

## 2014-10-15 NOTE — Telephone Encounter (Signed)
Med filled.  

## 2014-10-16 ENCOUNTER — Other Ambulatory Visit: Payer: Self-pay | Admitting: General Practice

## 2014-10-16 ENCOUNTER — Other Ambulatory Visit: Payer: Self-pay | Admitting: Family Medicine

## 2014-10-16 MED ORDER — SIMVASTATIN 40 MG PO TABS: ORAL_TABLET | ORAL | Status: DC

## 2014-10-16 NOTE — Telephone Encounter (Signed)
Med denied, filled yesterday.

## 2014-10-17 NOTE — Telephone Encounter (Signed)
Please advise this medication was filled #30 with 0 refills on 10-15-14. SIG states pt is able to take TID PRN. Ok to fill?

## 2014-10-17 NOTE — Telephone Encounter (Signed)
Pt should not be requiring this 3x/day and if she is, she needs an evaluation.  She should not be due for a refill until 2/10

## 2014-10-31 ENCOUNTER — Other Ambulatory Visit: Payer: Self-pay | Admitting: Family Medicine

## 2014-10-31 NOTE — Telephone Encounter (Signed)
Please advise.   Flexeril last filled 10/15/14 #30 with 0. SIG take 1 tablet TID as needed for pain, ok to fill?

## 2014-11-05 ENCOUNTER — Other Ambulatory Visit: Payer: Self-pay | Admitting: Family Medicine

## 2014-11-05 NOTE — Telephone Encounter (Signed)
meds filled

## 2014-11-06 ENCOUNTER — Other Ambulatory Visit: Payer: Self-pay | Admitting: Family Medicine

## 2014-11-07 NOTE — Telephone Encounter (Signed)
Med filled.  

## 2014-12-02 ENCOUNTER — Other Ambulatory Visit: Payer: Self-pay | Admitting: Family Medicine

## 2014-12-03 ENCOUNTER — Other Ambulatory Visit: Payer: Self-pay | Admitting: Family Medicine

## 2014-12-03 NOTE — Telephone Encounter (Signed)
Med filled.  

## 2014-12-12 ENCOUNTER — Telehealth: Payer: Self-pay | Admitting: Family Medicine

## 2014-12-12 NOTE — Telephone Encounter (Signed)
Pre Visit letter sent  °

## 2014-12-18 ENCOUNTER — Other Ambulatory Visit: Payer: Self-pay | Admitting: Family Medicine

## 2014-12-18 NOTE — Telephone Encounter (Signed)
Med filled.  

## 2014-12-28 ENCOUNTER — Telehealth: Payer: Self-pay | Admitting: Family Medicine

## 2014-12-28 NOTE — Telephone Encounter (Signed)
Pre Visit letter sent  °

## 2015-01-02 ENCOUNTER — Encounter: Payer: Self-pay | Admitting: Family Medicine

## 2015-01-12 ENCOUNTER — Other Ambulatory Visit: Payer: Self-pay | Admitting: Family Medicine

## 2015-01-14 NOTE — Telephone Encounter (Signed)
Med filled.  

## 2015-01-17 ENCOUNTER — Encounter: Payer: Self-pay | Admitting: Family Medicine

## 2015-01-18 ENCOUNTER — Other Ambulatory Visit: Payer: Self-pay | Admitting: Family Medicine

## 2015-01-18 NOTE — Telephone Encounter (Signed)
Med filled.  

## 2015-01-30 ENCOUNTER — Other Ambulatory Visit: Payer: Self-pay | Admitting: Family Medicine

## 2015-01-30 NOTE — Telephone Encounter (Signed)
Last OV 06-22-13 (upcoming appt 02-04-15) Flexeril last filled 12-18-14 #30 with 1 refill   SIG states 3 tablets daily as needed for muscle spasm. Ok to fil?

## 2015-02-04 ENCOUNTER — Ambulatory Visit: Payer: Self-pay | Admitting: Family Medicine

## 2015-02-06 ENCOUNTER — Other Ambulatory Visit: Payer: Self-pay | Admitting: Family Medicine

## 2015-02-06 NOTE — Telephone Encounter (Signed)
Med filled.  

## 2015-03-08 ENCOUNTER — Other Ambulatory Visit: Payer: Self-pay | Admitting: Family Medicine

## 2015-03-08 NOTE — Telephone Encounter (Signed)
Med filled.  

## 2015-03-13 ENCOUNTER — Ambulatory Visit: Payer: Self-pay | Admitting: Family Medicine

## 2015-03-14 ENCOUNTER — Other Ambulatory Visit: Payer: Self-pay | Admitting: Family Medicine

## 2015-03-14 NOTE — Telephone Encounter (Signed)
Med filled.  

## 2015-03-15 ENCOUNTER — Other Ambulatory Visit: Payer: Self-pay | Admitting: Family Medicine

## 2015-03-15 NOTE — Telephone Encounter (Signed)
Yes- start denying next month if she doesn't show up

## 2015-03-15 NOTE — Telephone Encounter (Signed)
FYI, i filled this medication. Pt was allowed refills until her follow up appt. This was suppose to have been on 6/27-16, however pt cancelled and rescheduled for next month. If another cancellation should i start denying, next month?

## 2015-03-19 ENCOUNTER — Other Ambulatory Visit: Payer: Self-pay | Admitting: Family Medicine

## 2015-03-19 NOTE — Telephone Encounter (Signed)
Med filled.  

## 2015-03-25 ENCOUNTER — Other Ambulatory Visit: Payer: Self-pay | Admitting: Family Medicine

## 2015-03-25 NOTE — Telephone Encounter (Signed)
Med filled.  

## 2015-04-09 ENCOUNTER — Telehealth: Payer: Self-pay | Admitting: Behavioral Health

## 2015-04-09 NOTE — Telephone Encounter (Signed)
Unable to reach patient at time of Pre-Visit Call.  Left message for patient to return call when available.    

## 2015-04-10 ENCOUNTER — Emergency Department (HOSPITAL_COMMUNITY): Payer: Medicare Other

## 2015-04-10 ENCOUNTER — Encounter (HOSPITAL_COMMUNITY): Payer: Self-pay | Admitting: Emergency Medicine

## 2015-04-10 ENCOUNTER — Ambulatory Visit: Payer: Self-pay | Admitting: Family Medicine

## 2015-04-10 ENCOUNTER — Emergency Department (HOSPITAL_COMMUNITY)
Admission: EM | Admit: 2015-04-10 | Discharge: 2015-04-10 | Disposition: A | Discharge status: Home / Self Care | Payer: Medicare Other | Attending: Emergency Medicine | Admitting: Emergency Medicine

## 2015-04-10 ENCOUNTER — Telehealth: Payer: Self-pay | Admitting: *Deleted

## 2015-04-10 DIAGNOSIS — R410 Disorientation, unspecified: Secondary | ICD-10-CM | POA: Diagnosis not present

## 2015-04-10 DIAGNOSIS — I6789 Other cerebrovascular disease: Secondary | ICD-10-CM | POA: Diagnosis not present

## 2015-04-10 DIAGNOSIS — R4781 Slurred speech: Secondary | ICD-10-CM | POA: Diagnosis not present

## 2015-04-10 DIAGNOSIS — Z87442 Personal history of urinary calculi: Secondary | ICD-10-CM | POA: Insufficient documentation

## 2015-04-10 DIAGNOSIS — R05 Cough: Secondary | ICD-10-CM | POA: Diagnosis not present

## 2015-04-10 DIAGNOSIS — Z87891 Personal history of nicotine dependence: Secondary | ICD-10-CM | POA: Diagnosis not present

## 2015-04-10 DIAGNOSIS — E785 Hyperlipidemia, unspecified: Secondary | ICD-10-CM | POA: Diagnosis not present

## 2015-04-10 DIAGNOSIS — R079 Chest pain, unspecified: Secondary | ICD-10-CM | POA: Diagnosis not present

## 2015-04-10 DIAGNOSIS — K219 Gastro-esophageal reflux disease without esophagitis: Secondary | ICD-10-CM | POA: Diagnosis not present

## 2015-04-10 DIAGNOSIS — I10 Essential (primary) hypertension: Secondary | ICD-10-CM | POA: Diagnosis not present

## 2015-04-10 DIAGNOSIS — Z79899 Other long term (current) drug therapy: Secondary | ICD-10-CM | POA: Diagnosis not present

## 2015-04-10 DIAGNOSIS — R4701 Aphasia: Secondary | ICD-10-CM | POA: Diagnosis present

## 2015-04-10 DIAGNOSIS — R4182 Altered mental status, unspecified: Secondary | ICD-10-CM | POA: Diagnosis not present

## 2015-04-10 DIAGNOSIS — Z7982 Long term (current) use of aspirin: Secondary | ICD-10-CM | POA: Diagnosis not present

## 2015-04-10 LAB — COMPREHENSIVE METABOLIC PANEL
ALT: 20 U/L (ref 14–54)
AST: 33 U/L (ref 15–41)
Albumin: 3.7 g/dL (ref 3.5–5.0)
Alkaline Phosphatase: 32 U/L — ABNORMAL LOW (ref 38–126)
Anion gap: 7 (ref 5–15)
BUN: 9 mg/dL (ref 6–20)
CHLORIDE: 108 mmol/L (ref 101–111)
CO2: 24 mmol/L (ref 22–32)
Calcium: 9.3 mg/dL (ref 8.9–10.3)
Creatinine, Ser: 0.97 mg/dL (ref 0.44–1.00)
Glucose, Bld: 94 mg/dL (ref 65–99)
POTASSIUM: 4.4 mmol/L (ref 3.5–5.1)
Sodium: 139 mmol/L (ref 135–145)
Total Bilirubin: 0.4 mg/dL (ref 0.3–1.2)
Total Protein: 6.3 g/dL — ABNORMAL LOW (ref 6.5–8.1)

## 2015-04-10 LAB — CBC
HCT: 33.8 % — ABNORMAL LOW (ref 36.0–46.0)
HEMOGLOBIN: 11.1 g/dL — AB (ref 12.0–15.0)
MCH: 29.5 pg (ref 26.0–34.0)
MCHC: 32.8 g/dL (ref 30.0–36.0)
MCV: 89.9 fL (ref 78.0–100.0)
Platelets: 118 10*3/uL — ABNORMAL LOW (ref 150–400)
RBC: 3.76 MIL/uL — AB (ref 3.87–5.11)
RDW: 14.6 % (ref 11.5–15.5)
WBC: 5.1 10*3/uL (ref 4.0–10.5)

## 2015-04-10 LAB — URINALYSIS, ROUTINE W REFLEX MICROSCOPIC
BILIRUBIN URINE: NEGATIVE
Glucose, UA: NEGATIVE mg/dL
HGB URINE DIPSTICK: NEGATIVE
KETONES UR: NEGATIVE mg/dL
Nitrite: POSITIVE — AB
PH: 5.5 (ref 5.0–8.0)
Protein, ur: NEGATIVE mg/dL
Specific Gravity, Urine: 1.011 (ref 1.005–1.030)
Urobilinogen, UA: 0.2 mg/dL (ref 0.0–1.0)

## 2015-04-10 LAB — SALICYLATE LEVEL: Salicylate Lvl: <4 mg/dL (ref 2.8–30.0)

## 2015-04-10 LAB — I-STAT CHEM 8, ED
BUN: 11 mg/dL (ref 6–20)
CHLORIDE: 103 mmol/L (ref 101–111)
Calcium, Ion: 1.34 mmol/L — ABNORMAL HIGH (ref 1.13–1.30)
Creatinine, Ser: 1 mg/dL (ref 0.44–1.00)
Glucose, Bld: 92 mg/dL (ref 65–99)
HCT: 35 % — ABNORMAL LOW (ref 36.0–46.0)
HEMOGLOBIN: 11.9 g/dL — AB (ref 12.0–15.0)
Potassium: 4.3 mmol/L (ref 3.5–5.1)
Sodium: 142 mmol/L (ref 135–145)
TCO2: 23 mmol/L (ref 0–100)

## 2015-04-10 LAB — DIFFERENTIAL
Basophils Absolute: 0.1 10*3/uL (ref 0.0–0.1)
Basophils Relative: 1 % (ref 0–1)
EOS ABS: 0.2 10*3/uL (ref 0.0–0.7)
Eosinophils Relative: 4 % (ref 0–5)
LYMPHS ABS: 2.3 10*3/uL (ref 0.7–4.0)
Lymphocytes Relative: 44 % (ref 12–46)
Monocytes Absolute: 0.3 10*3/uL (ref 0.1–1.0)
Monocytes Relative: 6 % (ref 3–12)
Neutro Abs: 2.3 10*3/uL (ref 1.7–7.7)
Neutrophils Relative %: 45 % (ref 43–77)

## 2015-04-10 LAB — ETHANOL: Alcohol, Ethyl (B): <5 mg/dL (ref <5)

## 2015-04-10 LAB — RAPID URINE DRUG SCREEN, HOSP PERFORMED
AMPHETAMINES: NOT DETECTED
BENZODIAZEPINES: POSITIVE — AB
Barbiturates: NOT DETECTED
Cocaine: NOT DETECTED
Opiates: NOT DETECTED
Tetrahydrocannabinol: NOT DETECTED

## 2015-04-10 LAB — URINE MICROSCOPIC-ADD ON

## 2015-04-10 LAB — ACETAMINOPHEN LEVEL: Acetaminophen (Tylenol), Serum: <10 ug/mL — ABNORMAL LOW (ref 10–30)

## 2015-04-10 LAB — I-STAT TROPONIN, ED: Troponin i, poc: 0 ng/mL (ref 0.00–0.08)

## 2015-04-10 LAB — PROTIME-INR
INR: 1.16 (ref 0.00–1.49)
Prothrombin Time: 15 seconds (ref 11.6–15.2)

## 2015-04-10 LAB — APTT: APTT: 29 s (ref 24–37)

## 2015-04-10 MED ORDER — DEXTROSE 5 % IV SOLN
1.0000 g | Freq: Once | INTRAVENOUS | Status: AC
  Administered 2015-04-10: 1 g via INTRAVENOUS
  Filled 2015-04-10: qty 10

## 2015-04-10 MED ORDER — SODIUM CHLORIDE 0.9 % IV BOLUS (SEPSIS)
1000.0000 mL | Freq: Once | INTRAVENOUS | Status: AC
  Administered 2015-04-10: 1000 mL via INTRAVENOUS

## 2015-04-10 MED ORDER — CEPHALEXIN 500 MG PO CAPS: 500.0000 mg | ORAL_CAPSULE | Freq: Four times a day (QID) | ORAL | Status: DC

## 2015-04-10 NOTE — ED Notes (Signed)
Woke up this morning with slurred speech. Called [facility] who called EMS for her. On arrival EMS endorses slurred speech and noted confusion. They called her husband who stated she has been confused for about a month, but her speech was normal last night when they went to bed at 2200 and she was still asleep when he went to work. He listened to her talk on the phone and endorsed abnormal speech. Arrives alert, oriented to self, place and situation, disoriented to time/date. Denies pain.

## 2015-04-10 NOTE — ED Provider Notes (Signed)
64 year old female here with clear complaint. Initially stated that she has slurred speech but on further examination does not corroborate that. States that she had an argument with her husband. No other complaints. On my examination patient is hemodynamically stable vital signs okay. Alert oriented to place and person and year. Neurologic exam intact. Initial concern for possible TIA, however it does not seem anybody witnessed this slurred speech and patient denies having had it. CT scan done and negative for any acute problems, was found to have a urinary tract infection.  At time of discharge mental status still intact and Still a stable neurologic exam. It appears that her discharge blood pressure was slightly low, however I was not made aware of this and multiple blood pressures prior to this were all within normal limits. I did evaluate the patient prior to discharge and was altered or short of breath or any slurred speech. This is likely an error.   I saw and evaluated the patient, reviewed the resident's note and I agree with the findings and plan.     EKG Interpretation   Date/Time:  Wednesday April 10 2015 09:59:27 EDT Ventricular Rate:  64 PR Interval:  192 QRS Duration: 106 QT Interval:  432 QTC Calculation: 446 R Axis:   -21 Text Interpretation:  Sinus rhythm Borderline left axis deviation Low  voltage, precordial leads Abnormal R-wave progression, late transition  Nonspecific T abnormalities, anterior leads T waves in III slightly worse  than previous, otherwise no significant change from 06/2006 Reconfirmed by  [facility] MD, Yarelli Decelles [phone]) on 04/10/2015 10:18:01 AM        Marily Memos, MD 04/10/15 1755

## 2015-04-10 NOTE — ED Notes (Signed)
NAD at this time. Pt is stable and leaving with her husband. 

## 2015-04-10 NOTE — Telephone Encounter (Signed)
Opened in error

## 2015-04-10 NOTE — Telephone Encounter (Signed)
FYI-  Patient called the office confused, slurring speech and stating she did not know where she was.  She stated she was in a blue and yellow room and that is all she knew.  Patient speech is slurred, difficult to understand.  She states she is scared and her husband is at work.  Patient stated she saw her bed and she is in her own apartment and all she knows is it is [address].  Patient could not recall day/time, birthday, address.  Called 911 and sent to patient's address- notified patient she was thankful, stating she was afraid and wanted someone to come get her.

## 2015-04-10 NOTE — ED Notes (Signed)
Pt leaving for CT.  

## 2015-04-10 NOTE — ED Notes (Signed)
CT has waiting list; notified patient of reason for delay.

## 2015-04-10 NOTE — ED Provider Notes (Signed)
CSN: [id number]     Arrival date & time 04/10/15  4098 History   First MD Initiated Contact with Patient 04/10/15 [phone]     Chief Complaint  Patient presents with  . Aphasia     (Consider location/radiation/quality/duration/timing/severity/associated sxs/prior Treatment) HPI Ms Kitzia Camus is a 64 yo female with past medical hx of HTN, GERD, hyperlipidemia, depression presenting today for altered mental status and slurred speech.  Pt apparently called [facility] this am with slurred speech and confusion and EMS was called to her location.  Last known normal was reported by her husband last night at 10pm.  Pt arrives with slurred speech, alert & oriented to person and place only.  She reports a prior history of smoking and alcohol use but denies any for the past 3-4 years.  Pt reports prescription use of Xanax 0.5mg  this morning as well as tizanidine and one other medication but cannot recall which one.  Denies any recent fever, chills, n/v/d, chest pain, shortness of breath.  No urinary complaints. Not complaining of any pain.    Past Medical History  Diagnosis Date  . GERD (gastroesophageal reflux disease)   . Hypertension   . Hyperlipidemia   . Depression   . DDD (degenerative disc disease), cervical   . Kidney stones    Past Surgical History  Procedure Laterality Date  . Neck surgery  2008  . Kidney stones  1988   History reviewed. No pertinent family history. History  Substance Use Topics  . Smoking status: Former Games developer  . Smokeless tobacco: Not on file  . Alcohol Use: No   OB History    No data available     Review of Systems  Constitutional: Negative for fever and chills.  Respiratory: Negative for cough and shortness of breath.   Cardiovascular: Negative for chest pain and leg swelling.  Gastrointestinal: Negative for nausea, vomiting and diarrhea.  Genitourinary: Negative for dysuria and frequency.  Neurological: Positive for speech difficulty. Negative for syncope and  weakness.      Allergies  Review of patient's allergies indicates no known allergies.  Home Medications   Prior to Admission medications   Medication Sig Start Date End Date Taking? Authorizing Provider  ALPRAZolam Prudy Feeler) 0.5 MG tablet Take 0.5 mg by mouth every 6 (six) hours as needed.   Yes Historical Provider, MD  aspirin 81 MG tablet Take 81 mg by mouth daily.   Yes Historical Provider, MD  atenolol (TENORMIN) 50 MG tablet TAKE ONE TABLET BY MOUTH ONCE DAILY 03/19/15  Yes Sheliah Hatch, MD  cyclobenzaprine (FLEXERIL) 10 MG tablet TAKE ONE TABLET BY MOUTH THREE TIMES DAILY AS NEEDED FOR MUSCLE SPASM 03/25/15  Yes Sheliah Hatch, MD  fenofibrate 160 MG tablet TAKE ONE TABLET BY MOUTH ONCE DAILY 01/18/15  Yes Sheliah Hatch, MD  lisinopril (PRINIVIL,ZESTRIL) 5 MG tablet TAKE ONE TABLET BY MOUTH ONCE DAILY 01/14/15  Yes Sheliah Hatch, MD  sertraline (ZOLOFT) 100 MG tablet Take 100 mg by mouth 2 (two) times daily.   Yes Historical Provider, MD  simvastatin (ZOCOR) 40 MG tablet TAKE ONE TABLET BY MOUTH IN THE EVENING 03/14/15  Yes Sheliah Hatch, MD  tiZANidine (ZANAFLEX) 4 MG tablet TAKE ONE TABLET BY MOUTH THREE TIMES DAILY 03/19/15  Yes Sheliah Hatch, MD  amitriptyline (ELAVIL) 10 MG tablet Take 20 mg by mouth at bedtime as needed.    Historical Provider, MD  naproxen (NAPROSYN) 500 MG tablet TAKE ONE TABLET BY MOUTH TWICE  DAILY WITH MEALS Patient not taking: Reported on 04/10/2015 10/10/13   Sheliah Hatch, MD  omeprazole (PRILOSEC) 40 MG capsule TAKE ONE CAPSULE BY MOUTH ONCE DAILY Patient not taking: Reported on 04/10/2015 10/09/14   Sheliah Hatch, MD  promethazine (PHENERGAN) 12.5 MG tablet PT NEEDS OFFICE VISIT.  Take one tablet by mouth every 8 hours as needed for nausea. Patient not taking: Reported on 04/10/2015 08/22/13   Sheliah Hatch, MD  traMADol (ULTRAM) 50 MG tablet TAKE ONE TABLET BY MOUTH EVERY 6 HOURS AS NEEDED FOR PAIN Patient not taking:  Reported on 04/10/2015 05/10/12   Sheliah Hatch, MD  traZODone (DESYREL) 50 MG tablet TAKE ONE-HALF TABLET BY MOUTH AT BEDTIME AS NEEDED FOR SLEEP 09/17/13   Sheliah Hatch, MD   BP 109/36 mmHg  Pulse 73  Temp(Src) 98.4 F (36.9 C) (Oral)  Resp 20  Ht 5' 5.75" (1.67 m)  SpO2 96% Physical Exam  Constitutional: She appears well-developed and well-nourished.  HENT:  Head: Normocephalic and atraumatic.  Poor dentition  Eyes: Conjunctivae and EOM are normal.  Cardiovascular: Normal rate and regular rhythm.   Pulmonary/Chest: Effort normal and breath sounds normal.  Abdominal: Soft. Bowel sounds are normal. There is no tenderness.  Neurological: She has normal strength. No cranial nerve deficit.  Alert and oriented to person and place only. Normal finger-to-nose, heel-to-shin.  Psychiatric: Her speech is slurred.    ED Course  Procedures (including critical care time) Labs Review Labs Reviewed  CBC - Abnormal; Notable for the following:    RBC 3.76 (*)    Hemoglobin 11.1 (*)    HCT 33.8 (*)    Platelets 118 (*)    All other components within normal limits  COMPREHENSIVE METABOLIC PANEL - Abnormal; Notable for the following:    Total Protein 6.3 (*)    Alkaline Phosphatase 32 (*)    All other components within normal limits  URINE RAPID DRUG SCREEN, HOSP PERFORMED - Abnormal; Notable for the following:    Benzodiazepines POSITIVE (*)    All other components within normal limits  URINALYSIS, ROUTINE W REFLEX MICROSCOPIC (NOT AT [facility]) - Abnormal; Notable for the following:    APPearance CLOUDY (*)    Nitrite POSITIVE (*)    Leukocytes, UA SMALL (*)    All other components within normal limits  ACETAMINOPHEN LEVEL - Abnormal; Notable for the following:    Acetaminophen (Tylenol), Serum <10 (*)    All other components within normal limits  URINE MICROSCOPIC-ADD ON - Abnormal; Notable for the following:    Bacteria, UA MANY (*)    All other components within normal limits   I-STAT CHEM 8, ED - Abnormal; Notable for the following:    Calcium, Ion 1.34 (*)    Hemoglobin 11.9 (*)    HCT 35.0 (*)    All other components within normal limits  URINE CULTURE  PROTIME-INR  APTT  DIFFERENTIAL  ETHANOL  SALICYLATE LEVEL  I-STAT TROPOININ, ED  CBG MONITORING, ED    Imaging Review Dg Chest 2 View  04/10/2015   CLINICAL DATA:  Pt was brought to the ED for slurred speech and confusion this am. Pt is not experiencing chest pain, SOB, or coughing. Hx of HTN. Pt was disoriented during the exam. Nonsmoker.  EXAM: CHEST  2 VIEW  COMPARISON:  06/29/2006  FINDINGS: Linear lingular airspace disease likely reflecting atelectasis. There is mild bilateral chronic interstitial thickening. There is no focal parenchymal opacity. There is no pleural  effusion or pneumothorax. The heart and mediastinal contours are unremarkable.  The osseous structures are unremarkable.  IMPRESSION: No active cardiopulmonary disease.   Electronically Signed   By: Elige Ko   On: 04/10/2015 11:33   Ct Head Wo Contrast  04/10/2015   CLINICAL DATA:  Confusion and slurred speech  EXAM: CT HEAD WITHOUT CONTRAST  TECHNIQUE: Contiguous axial images were obtained from the base of the skull through the vertex without intravenous contrast.  COMPARISON:  None.  FINDINGS: No mass effect, midline shift, or acute hemorrhage. Brain parenchyma, ventricular system, and extra-axial space are within normal limits for age. Mastoid air cells are clear. Visualized sinuses are clear.  IMPRESSION: Negative head CT.   Electronically Signed   By: Jolaine Click M.D.   On: 04/10/2015 13:10     EKG Interpretation   Date/Time:  Wednesday April 10 2015 09:59:27 EDT Ventricular Rate:  64 PR Interval:  192 QRS Duration: 106 QT Interval:  432 QTC Calculation: 446 R Axis:   -21 Text Interpretation:  Sinus rhythm Borderline left axis deviation Low  voltage, precordial leads Abnormal R-wave progression, late transition  Nonspecific  T abnormalities, anterior leads T waves in III slightly worse  than previous, otherwise no significant change from 06/2006 Reconfirmed by  [facility] MD, Barbara Cower [phone]) on 04/10/2015 10:18:01 AM      MDM   Final diagnoses:  None    10:45am - 64 yo female with slurred speech and confusion with last known normal of last night around 10pm.  Oriented to person and place only.  Neuro exam reveals slurred speech without deficits in CN II-XII.  Strength 5/5 in bilateral upper and lower extremities.  Cerebellar testing normal.  EKG with sinus rhythm and no significant change from October 2007.  Troponin negative.  CBC with mild anemia, normal WBC.  CBG 185.  Other labs and imagining pending.  Will give 1L NS bolus to help with her blood pressures and re-evaluate.  11:15am - PT/INR, aPTT all normal.  Acetaminophen, salicylate, and ethanol levels not elevated.  CMP does not show any electrolyte abnormalities.  Pt receiving fluids.  Still only oriented to person and place.  Speech appears somewhat improved from earlier.    11:45am - spoke with husband via phone.  States that he went to work at [employer] today while patient slept.  Received phone call from EMS stating his wife was slurring speech, confused.  When given the phone she accidentally hung up on him.  EMS called back, spoke to husband, but he did not speak to her.  States for the past few weeks, patient has become more forgetful, confused, repeats herself frequently.  CXR read and compared to October 2007 as no active cardiopulmonary disease.  1:15pm - Urinalysis positive for nitrites, small amount leukocytes, and many bacteria.  Will start abx with 1gm Rocephin IV.  CT head w/o contrast still pending.  1:45pm - CT scan of head read as negative scan.  Patient speech improved from earlier.  Husband now at bedside.  States that she is acting at her baseline.  Concern that altered status earlier today was related to medication misuse.  Will plan for discharge on  Keflex 500mg  PO qid x 5 days and close follow up with PCP this week and return to ED if symptoms worsen.  Gwynn Burly, DO 04/10/15 1545  Marily Memos, MD 04/10/15 [phone]

## 2015-04-10 NOTE — ED Notes (Signed)
CBG 185 

## 2015-04-10 NOTE — ED Notes (Signed)
Patient transported to CT 

## 2015-04-10 NOTE — Discharge Instructions (Signed)

## 2015-04-13 LAB — URINE CULTURE: Culture: >=100000

## 2015-04-15 ENCOUNTER — Other Ambulatory Visit: Payer: Self-pay | Admitting: Family Medicine

## 2015-04-15 ENCOUNTER — Telehealth (HOSPITAL_COMMUNITY): Payer: Self-pay

## 2015-04-15 NOTE — Telephone Encounter (Signed)
Med denied, per provider refill note from 03/15/15. Pt has made and cancelled appt last 2 months. Cannot fill until she is seen.

## 2015-04-15 NOTE — Telephone Encounter (Signed)
Post ED Visit - Positive Culture Follow-up  Culture report reviewed by antimicrobial stewardship pharmacist:  Wes Dulaney, Pharm.D., BCPS  Celedonio Miyamoto, Pharm.D., BCPS  Georgina Pillion, Pharm.D., BCPS  Eunice, [address]D., BCPS, AAHIVP  Estella Husk, Pharm.D., BCPS, AAHIVP  Elder Cyphers, [address]D., BCPS  Positive Urine culture, >/= 100,000 colonies -> E Coli Treated with Cephalexin, organism sensitive to the same and no further patient follow-up is required at this time.  Arvid Right 04/15/2015, 5:50 AM

## 2015-04-16 ENCOUNTER — Other Ambulatory Visit: Payer: Self-pay | Admitting: Family Medicine

## 2015-04-16 NOTE — Telephone Encounter (Signed)
This medication refill request was denied today. Pt needs an appointment. Per last PCP refill note 03-15-15

## 2015-04-24 ENCOUNTER — Other Ambulatory Visit: Payer: Self-pay | Admitting: Family Medicine

## 2015-04-24 NOTE — Telephone Encounter (Signed)
Med denied, until pt is seen per PCP.

## 2015-04-25 ENCOUNTER — Other Ambulatory Visit: Payer: Self-pay | Admitting: Family Medicine

## 2015-04-25 NOTE — Telephone Encounter (Signed)
This medication refill request was denied today. Pt needs an  appointment.

## 2015-04-29 ENCOUNTER — Other Ambulatory Visit: Payer: Self-pay | Admitting: Family Medicine

## 2015-04-29 NOTE — Telephone Encounter (Signed)
Med denied until pt is seen.

## 2015-05-06 ENCOUNTER — Other Ambulatory Visit: Payer: Self-pay | Admitting: Family Medicine

## 2015-05-06 NOTE — Telephone Encounter (Signed)
Med denied until pt is seen.

## 2015-05-08 DIAGNOSIS — F411 Generalized anxiety disorder: Secondary | ICD-10-CM | POA: Diagnosis not present

## 2015-05-08 DIAGNOSIS — G43009 Migraine without aura, not intractable, without status migrainosus: Secondary | ICD-10-CM | POA: Diagnosis not present

## 2015-05-08 DIAGNOSIS — F5104 Psychophysiologic insomnia: Secondary | ICD-10-CM | POA: Diagnosis not present

## 2015-05-09 ENCOUNTER — Encounter: Payer: Self-pay | Admitting: Family Medicine

## 2015-05-09 ENCOUNTER — Ambulatory Visit (INDEPENDENT_AMBULATORY_CARE_PROVIDER_SITE_OTHER): Payer: Medicare Other | Admitting: Family Medicine

## 2015-05-09 VITALS — BP 112/80 | HR 87 | Temp 97.9°F | Resp 17 | Ht 66.0 in | Wt 187.0 lb

## 2015-05-09 DIAGNOSIS — I10 Essential (primary) hypertension: Secondary | ICD-10-CM

## 2015-05-09 DIAGNOSIS — R413 Other amnesia: Secondary | ICD-10-CM | POA: Diagnosis not present

## 2015-05-09 DIAGNOSIS — E785 Hyperlipidemia, unspecified: Secondary | ICD-10-CM | POA: Diagnosis not present

## 2015-05-09 NOTE — Patient Instructions (Signed)
Follow up in 1 month to recheck memory We'll notify you of your lab results and make any changes if needed I'll contact Dr Hyacinth Meeker and let him know what's going on Call with any questions or concerns Hang in there!!!

## 2015-05-09 NOTE — Assessment & Plan Note (Signed)
New.  Husband is clearly concerned about her deteriorating mental status.  Pt was brought to ER by EMS 7/27 after she woke confused and w/ slurred speech.  She does not remember calling our office, having EMS come to the house or bring her to ER.  She does not remember spending the day in the ER and having multiple tests done (CT and blood work unremarkable w/ exception of UTI- for which she was treated).  Pt is not able to tell me her address or phone #.  Does not know husband's bday.  Pt is easily confused by questions and will respond in a nonsensical way.  Pt saw neuro yesterday but there was no mention in his note of any concerns.  Pt clearly has underlying issues today despite her best attempts to hide them or play them off as no big deal.  Will get labs to day and send copy of today's note- outlining my concerns to Dr Hyacinth Meeker (who pt did not remember seeing yesterday when I asked if she had other doctors and where she was getting meds from).  Will follow closely to try and determine cause for pt's issues.

## 2015-05-09 NOTE — Assessment & Plan Note (Signed)
Chronic problem.  Currently on Simvastatin and Fenofibrate.  Denies medication side effects.  Check labs.  Adjust meds prn

## 2015-05-09 NOTE — Assessment & Plan Note (Signed)
Chronic problem.  Good control on Lisinopril and Atenolol.  Asymptomatic.  Check labs.  No anticipated med changes.

## 2015-05-09 NOTE — Progress Notes (Signed)
   Subjective:    Patient ID: Tina Fitzpatrick, female    DOB: 1951-01-31, 64 y.o.   MRN: [id number]  HPI HTN- chronic problem, on Atenolol, Lisinopril.  Good control.  No CP, SOB, HAs, visual changes, edema.  Hyperlipidemia- chronic problem, on Simvastatin and fenofibrate.  No abd pain, N/V,myalgias.  Depression- chronic problem, on Zoloft.  On Elavil nightly.  On Alprazolam.  Pt has not been getting these prescriptions from this office but states I am her only doctor.  Husband is concerned about pt's memory issues.  Pt ended up in ER on 7/27 w/ slurred speech, confusion.  She does not recall EMS evaluation or time in ER.  Husband reports memory is worse 'every day'.  Pt has a neurologist- Dr Hyacinth Meeker, [place].  At time of evaluation in ER, they were concerned pt's slurred speech and confusion was due to medication misuse.  Pt does not know her address.  Pt knows her birthday.  Pt knows it is August 2016.  Does not know phone #.   Review of Systems For ROS see HPI     Objective:   Physical Exam  Constitutional: She appears well-developed and well-nourished. No distress.  HENT:  Head: Normocephalic and atraumatic.  Eyes: Conjunctivae and EOM are normal. Pupils are equal, round, and reactive to light.  Neck: Normal range of motion. Neck supple. No thyromegaly present.  Cardiovascular: Normal rate, regular rhythm, normal heart sounds and intact distal pulses.   No murmur heard. Pulmonary/Chest: Effort normal and breath sounds normal. No respiratory distress.  Abdominal: Soft. She exhibits no distension. There is no tenderness.  Musculoskeletal: She exhibits no edema.  Lymphadenopathy:    She has no cervical adenopathy.  Neurological: She is alert.  Easily confused.  Difficulty answering question  Skin: Skin is warm and dry.  Psychiatric: Her behavior is normal.  Flat affect  Vitals reviewed.         Assessment & Plan:

## 2015-05-09 NOTE — Progress Notes (Signed)
Pre visit review using our clinic review tool, if applicable. No additional management support is needed unless otherwise documented below in the visit note. 

## 2015-05-10 ENCOUNTER — Encounter: Payer: Self-pay | Admitting: General Practice

## 2015-05-10 LAB — CBC WITH DIFFERENTIAL/PLATELET
BASOS PCT: 1.5 % (ref 0.0–3.0)
Basophils Absolute: 0.1 10*3/uL (ref 0.0–0.1)
Eosinophils Absolute: 0.1 10*3/uL (ref 0.0–0.7)
Eosinophils Relative: 2.7 % (ref 0.0–5.0)
HCT: 37.5 % (ref 36.0–46.0)
HEMOGLOBIN: 12.6 g/dL (ref 12.0–15.0)
Lymphocytes Relative: 30.2 % (ref 12.0–46.0)
Lymphs Abs: 1.6 10*3/uL (ref 0.7–4.0)
MCHC: 33.7 g/dL (ref 30.0–36.0)
MCV: 88.7 fl (ref 78.0–100.0)
MONO ABS: 0.3 10*3/uL (ref 0.1–1.0)
Monocytes Relative: 4.8 % (ref 3.0–12.0)
NEUTROS ABS: 3.3 10*3/uL (ref 1.4–7.7)
Neutrophils Relative %: 60.8 % (ref 43.0–77.0)
PLATELETS: 167 10*3/uL (ref 150.0–400.0)
RBC: 4.23 Mil/uL (ref 3.87–5.11)
RDW: 14.1 % (ref 11.5–15.5)
WBC: 5.4 10*3/uL (ref 4.0–10.5)

## 2015-05-10 LAB — TSH: TSH: 3.02 u[IU]/mL (ref 0.35–4.50)

## 2015-05-10 LAB — HEPATIC FUNCTION PANEL
ALK PHOS: 39 U/L (ref 39–117)
ALT: 21 U/L (ref 0–35)
AST: 33 U/L (ref 0–37)
Albumin: 4.5 g/dL (ref 3.5–5.2)
BILIRUBIN DIRECT: 0.1 mg/dL (ref 0.0–0.3)
BILIRUBIN TOTAL: 0.5 mg/dL (ref 0.2–1.2)
Total Protein: 7.7 g/dL (ref 6.0–8.3)

## 2015-05-10 LAB — BASIC METABOLIC PANEL
BUN: 11 mg/dL (ref 6–23)
CO2: 26 meq/L (ref 19–32)
Calcium: 10.1 mg/dL (ref 8.4–10.5)
Chloride: 105 mEq/L (ref 96–112)
Creatinine, Ser: 1.09 mg/dL (ref 0.40–1.20)
GFR: 53.91 mL/min — ABNORMAL LOW (ref >60.00)
Glucose, Bld: 99 mg/dL (ref 70–99)
POTASSIUM: 4.4 meq/L (ref 3.5–5.1)
SODIUM: 141 meq/L (ref 135–145)

## 2015-05-10 LAB — LIPID PANEL
CHOL/HDL RATIO: 5
Cholesterol: 107 mg/dL (ref 0–200)
HDL: 23.4 mg/dL — ABNORMAL LOW (ref >39.00)
LDL Cholesterol: 46 mg/dL (ref 0–99)
NonHDL: 83.85
Triglycerides: 187 mg/dL — ABNORMAL HIGH (ref 0.0–149.0)
VLDL: 37.4 mg/dL (ref 0.0–40.0)

## 2015-05-10 LAB — B12 AND FOLATE PANEL
Folate: >24.8 ng/mL (ref >5.9)
VITAMIN B 12: 334 pg/mL (ref 211–911)

## 2015-05-13 ENCOUNTER — Telehealth: Payer: Self-pay | Admitting: Family Medicine

## 2015-05-13 ENCOUNTER — Other Ambulatory Visit: Payer: Self-pay | Admitting: Family Medicine

## 2015-05-13 NOTE — Telephone Encounter (Signed)
Caller name: Tina Fitzpatrick Relationship to patient: husband Can be reached: [phone]  Reason for call: Was Dr. Beverely Low about to discuss health conditions with neurology Dr. Hyacinth Meeker of [facility]? Also, pt has jury summons for 06/19/15 and he states she needs a note to send in that her health condition would not allow her to attend.

## 2015-05-13 NOTE — Telephone Encounter (Signed)
Called and spoke with pt husband, informed that Dr. Beverely Low was out of office and that I believe she was sending Dr. Hyacinth Meeker a message about pt's symptoms at OV. Please advise on the letter for pt.

## 2015-05-13 NOTE — Telephone Encounter (Signed)
Medication filled to pharmacy as requested.   

## 2015-05-15 ENCOUNTER — Encounter: Payer: Self-pay | Admitting: Family Medicine

## 2015-05-15 NOTE — Telephone Encounter (Signed)
Letter printed for jury duty excuse

## 2015-06-10 ENCOUNTER — Ambulatory Visit (INDEPENDENT_AMBULATORY_CARE_PROVIDER_SITE_OTHER): Payer: Medicare Other | Admitting: Family Medicine

## 2015-06-10 ENCOUNTER — Encounter: Payer: Self-pay | Admitting: Family Medicine

## 2015-06-10 VITALS — BP 124/80 | HR 76 | Temp 98.1°F | Resp 16 | Ht 66.0 in | Wt 175.4 lb

## 2015-06-10 DIAGNOSIS — Z23 Encounter for immunization: Secondary | ICD-10-CM | POA: Diagnosis not present

## 2015-06-10 DIAGNOSIS — R413 Other amnesia: Secondary | ICD-10-CM | POA: Diagnosis not present

## 2015-06-10 NOTE — Patient Instructions (Signed)
Schedule your complete physical in 5 months We'll call you with your Neuro appt No driving! Please call with any questions or concerns If you want to join [place] at the new [place] office, any scheduled appointments will automatically transfer and we will see you at [address] [place] [address] [place], [place] [address]  Happy Early [place] Ouch!!!

## 2015-06-10 NOTE — Progress Notes (Signed)
   Subjective:    Patient ID: Tina Fitzpatrick, female    DOB: Jan 16, 1951, 64 y.o.   MRN: [id number]  HPI Memory loss- pt was supposed to follow up w/ Dr Hyacinth Meeker regarding the confusion.  Husband reports no change in memory.  They have not seen or had f/u w/ Dr Hyacinth Meeker.  'it's the latter part of November'.  Pt does not know the year- guessed 2016.  Does know birthday.  Pt doesn't know address despite moving 1 yr ago.  Pt does know both her phone #s.  Pt does not drive- stopped driving when she got lost coming home (to her old home of 12 yrs).   Review of Systems For ROS see HPI     Objective:   Physical Exam  Constitutional: She appears well-developed and well-nourished. No distress.  HENT:  Head: Normocephalic and atraumatic.  Eyes: Conjunctivae and EOM are normal. Pupils are equal, round, and reactive to light.  Neck: Normal range of motion. Neck supple.  Cardiovascular: Normal rate, regular rhythm and normal heart sounds.   Pulmonary/Chest: Effort normal and breath sounds normal. No respiratory distress. She has no wheezes. She has no rales.  Lymphadenopathy:    She has no cervical adenopathy.  Neurological: She is alert.  Skin: Skin is warm and dry.  Psychiatric:  Defensive when questioned about memory issues  Vitals reviewed.         Assessment & Plan:

## 2015-06-10 NOTE — Progress Notes (Signed)
Pre visit review using our clinic review tool, if applicable. No additional management support is needed unless otherwise documented below in the visit note. 

## 2015-06-11 NOTE — Assessment & Plan Note (Signed)
Ongoing issue for pt.  She saw neuro last month but there was no mention of memory issues or the incident that prompted EMS to be called and pt to go to ER and yet she has no memory of this.  Husband is asking for referral for 2nd opinion as there was no response from current Neurologist after my last note was sent.  Offered to call current neuro, husband prefers a change.  Referral placed.  Will follow closely.

## 2015-06-24 ENCOUNTER — Other Ambulatory Visit (INDEPENDENT_AMBULATORY_CARE_PROVIDER_SITE_OTHER): Payer: Medicare Other

## 2015-06-24 ENCOUNTER — Encounter: Payer: Self-pay | Admitting: Neurology

## 2015-06-24 ENCOUNTER — Ambulatory Visit (INDEPENDENT_AMBULATORY_CARE_PROVIDER_SITE_OTHER): Payer: Medicare Other | Admitting: Neurology

## 2015-06-24 VITALS — BP 118/72 | HR 81 | Resp 16 | Wt 173.0 lb

## 2015-06-24 DIAGNOSIS — R413 Other amnesia: Secondary | ICD-10-CM

## 2015-06-24 DIAGNOSIS — R4182 Altered mental status, unspecified: Secondary | ICD-10-CM

## 2015-06-24 DIAGNOSIS — G514 Facial myokymia: Secondary | ICD-10-CM | POA: Diagnosis not present

## 2015-06-24 DIAGNOSIS — G9341 Metabolic encephalopathy: Secondary | ICD-10-CM | POA: Insufficient documentation

## 2015-06-24 LAB — SEDIMENTATION RATE: SED RATE: 33 mm/h — AB (ref 0–22)

## 2015-06-24 NOTE — Progress Notes (Signed)
NEUROLOGY CONSULTATION NOTE  Tina Fitzpatrick MRN: [id number] DOB: June 13, 1951  Referring provider: Dr. Annye Asa Primary care provider: Dr. Annye Asa  Reason for consult:  Memory loss  Dear Dr Birdie Riddle:  Thank you for your kind referral of Tina Fitzpatrick for consultation of the above symptoms. Although her history is well known to you, please allow me to reiterate it for the purpose of our medical record. The patient was accompanied to the clinic by her husband who also provides collateral information. Records and images were personally reviewed where available.  HISTORY OF PRESENT ILLNESS: This is a pleasant 64 year old right-handed woman with a history of hypertension, hyperlipidemia, depression, anxiety, presenting for evaluation of memory loss and confusion. Her husband supplies majority of the history, she became upset stating she did not know about what he was talking about. Her husband reports she was in her usual state of health until 1-1/2 years ago when she started becoming more forgetful, putting things in the wrong place, unable to recall 10 minutes later if she had already taken her medication. She has gotten ice cream out of the freezer then puts it back in the fridge, or gets a drink and lays it on the floor and does not recall doing this. She stopped driving 2 years ago because she got lost coming back from her sister's house in [place]. She has confusion "all the time," worsening on a daily basis. She reports that he gets upset with her and "hollers at me." The husband on the other hand, states that she talks and he has not idea what she is talking about, making her upset, then the situation worsens when he tries to help her. Over the past 6-7 months, she would "talk out of her head," one time calling her husband at work saying she with with her daughter (who lives in the [place]), stating she drove to see her. He does not help her with her medications and thinks  she takes them, she states she has no idea what the medications are for, and gets irate at her husband. She had called her PCP's office last 04/10/15, with note of slurred speech and confusion. She stated she did not know where she was, she was in a blue and yellow room and that was all she knew. She was difficult to understand, and reported she was scared and her husband was at work. Staff called 911 and patient was brought to [facility] ER where she was diagnosed with a UTI. CBC and BMP were unremarkable. I personally reviewed head CT without contrast which was normal. She saw her PCP and had more bloodwork done, including normal B12, folate, TSH. She reports seeing neurologist Dr. Sabra Heck in the past, but does not recall what he was seeing her for. Her husband reports he was treating her for neck pain. She herself feels her memory is not so good, she feels she can remember things but the words do not go where they are supposed to. She denies any history of concussions, no alcohol use. No recent medication changes, she reports taking Xanax every 6 hours for anxiety. Her mother had memory changes. She denies any headaches, dizziness, diplopia, dysarthria, dysphagia, neck/back pain, focal numbness/tingling/weakness, bowel/bladder dysfunction, tremors, or anosmia. No falls.    PAST MEDICAL HISTORY: Past Medical History  Diagnosis Date  . GERD (gastroesophageal reflux disease)   . Hypertension   . Hyperlipidemia   . Depression   . DDD (degenerative disc disease), cervical   .  Kidney stones     PAST SURGICAL HISTORY: Past Surgical History  Procedure Laterality Date  . Neck surgery  2008    Plate/Screws  . Kidney stones  1988    MEDICATIONS: Current Outpatient Prescriptions on File Prior to Visit  Medication Sig Dispense Refill  . ALPRAZolam (XANAX) 0.5 MG tablet TAKE ONE TABLET BY MOUTH 4 TIMES DAILY AS NEEDED FOR ANXIETY    . amitriptyline (ELAVIL) 10 MG tablet Take 20 mg by mouth at bedtime as  needed.    Marland Kitchen aspirin 81 MG tablet Take 81 mg by mouth daily.    Marland Kitchen atenolol (TENORMIN) 50 MG tablet TAKE ONE TABLET BY MOUTH ONCE DAILY 30 tablet 6  . cyclobenzaprine (FLEXERIL) 10 MG tablet TAKE ONE TABLET BY MOUTH THREE TIMES DAILY AS NEEDED FOR MUSCLE SPASM 30 tablet 2  . fenofibrate 160 MG tablet TAKE ONE TABLET BY MOUTH ONCE DAILY 30 tablet 6  . lisinopril (PRINIVIL,ZESTRIL) 5 MG tablet TAKE ONE TABLET BY MOUTH ONCE DAILY 30 tablet 6  . omeprazole (PRILOSEC) 40 MG capsule TAKE ONE CAPSULE BY MOUTH ONCE DAILY 30 capsule 3  . promethazine (PHENERGAN) 12.5 MG tablet PT NEEDS OFFICE VISIT.  Take one tablet by mouth every 8 hours as needed for nausea. 30 tablet 0  . sertraline (ZOLOFT) 100 MG tablet Take 100 mg by mouth 2 (two) times daily.    . simvastatin (ZOCOR) 40 MG tablet TAKE ONE TABLET BY MOUTH IN THE EVENING 90 tablet 0  . traMADol (ULTRAM) 50 MG tablet TAKE ONE TABLET BY MOUTH EVERY 6 HOURS AS NEEDED FOR PAIN 90 tablet 3  . traZODone (DESYREL) 50 MG tablet TAKE ONE-HALF TABLET BY MOUTH AT BEDTIME AS NEEDED FOR SLEEP 30 tablet 0  . naproxen (NAPROSYN) 500 MG tablet TAKE ONE TABLET BY MOUTH TWICE DAILY WITH MEALS (Patient not taking: Reported on 04/10/2015) 60 tablet 5   No current facility-administered medications on file prior to visit.    ALLERGIES: No Known Allergies  FAMILY HISTORY: No family history on file.  SOCIAL HISTORY: Social History   Social History  . Marital Status: Married    Spouse Name: N/A  . Number of Children: 2  . Years of Education: N/A   Occupational History  . Not on file.   Social History Main Topics  . Smoking status: Former Smoker    Types: Cigarettes  . Smokeless tobacco: Never Used  . Alcohol Use: No  . Drug Use: No  . Sexual Activity: Not on file   Other Topics Concern  . Not on file   Social History Narrative   Married with 2 children   Right handed    12 th grade    1 cup daily    REVIEW OF SYSTEMS: Constitutional: No  fevers, chills, or sweats, no generalized fatigue, change in appetite Eyes: No visual changes, double vision, eye pain Ear, nose and throat: No hearing loss, ear pain, nasal congestion, sore throat Cardiovascular: No chest pain, palpitations Respiratory:  No shortness of breath at rest or with exertion, wheezes GastrointestinaI: No nausea, vomiting, diarrhea, abdominal pain, fecal incontinence Genitourinary:  No dysuria, urinary retention or frequency Musculoskeletal:  No neck pain, back pain Integumentary: No rash, pruritus, skin lesions Neurological: as above Psychiatric: No depression, insomnia, +anxiety Endocrine: No palpitations, fatigue, diaphoresis, mood swings, change in appetite, change in weight, increased thirst Hematologic/Lymphatic:  No anemia, purpura, petechiae. Allergic/Immunologic: no itchy/runny eyes, nasal congestion, recent allergic reactions, rashes  PHYSICAL EXAM: Filed Vitals:  06/24/15 1010  BP: 118/72  Pulse: 81  Resp: 16   General: No acute distress Head:  Normocephalic/atraumatic Eyes: Fundoscopic exam shows bilateral sharp discs, no vessel changes, exudates, or hemorrhages Neck: supple, no paraspinal tenderness, full range of motion Back: No paraspinal tenderness Heart: regular rate and rhythm Lungs: Clear to auscultation bilaterally. Vascular: No carotid bruits. Skin/Extremities: No rash, no edema Neurological Exam: Mental status: alert and oriented to person, place, and month/year, mild dysarthria, no aphasia, Fund of knowledge is appropriate.  Recent and remote memory are impaired. Attention and concentration are normal.    Able to name objects and repeat phrases. Clock drawing test 1/5. MMSE - Mini Mental State Exam 06/24/2015  Orientation to time 2  Orientation to Place 5  Registration 3  Attention/ Calculation 2  Recall 0  Language- name 2 objects 2  Language- repeat 1  Language- follow 3 step command 2  Language- read & follow direction 1    Write a sentence 1  Copy design 0  Total score 19   Cranial nerves: CN I: not tested CN II: pupils equal, round and reactive to light, visual fields intact, fundi unremarkable. CN III, IV, VI:  full range of motion, no nystagmus, no ptosis CN V: facial sensation intact CN VII: upper and lower face symmetric. She is noted to have occasional twitching on the left side of her chin CN VIII: hearing intact to finger rub CN IX, X: gag intact, uvula midline CN XI: sternocleidomastoid and trapezius muscles intact CN XII: tongue midline Bulk & Tone: normal, no fasciculations. Motor: 5/5 throughout with no pronator drift. Sensation: intact to light touch, cold, pin, vibration and joint position sense.  No extinction to double simultaneous stimulation.  Romberg test negative Deep Tendon Reflexes: +2 throughout, no ankle clonus Plantar responses: downgoing bilaterally Cerebellar: no incoordination on finger to nose, heel to shin. No dysdiadochokinesia Gait: narrow-based and steady, able to tandem walk adequately. Tremor: none  IMPRESSION: This is a pleasant 64 year old right-handed woman with a history of hypertension, hyperlipidemia, depression, anxiety, presenting with worsening mental status over the past year or so. She was brought to the ER for similar symptoms last July. The etiology of symptoms is unclear, neurological exam non-focal except for note of occasional left chin twitching. MMSE 19/30. MRI brain with and without contrast will be ordered to assess for underlying structural abnormality. Routine EEG will be done. Check RPR, HIV Ab, ESR. She may need a lumbar puncture if tests are unrevealing and will follow-up after tests.   Thank you for allowing me to participate in the care of this patient. Please do not hesitate to call for any questions or concerns.   Ellouise Newer, M.D.  CC: Dr. Birdie Riddle

## 2015-06-24 NOTE — Patient Instructions (Addendum)
1. Schedule MRI brain with and without contrast 2. Schedule routine EEG 3. Bloodwork for RPR, HIV, ESR 4. Follow-up after tests  5. You have been scheduled at [facility] for an MRI on 06/27/15 @ 11:00 am   [address]   [place], [place] [address]   [phone]

## 2015-06-25 ENCOUNTER — Encounter: Payer: Self-pay | Admitting: Neurology

## 2015-06-25 ENCOUNTER — Telehealth: Payer: Self-pay | Admitting: Neurology

## 2015-06-25 LAB — RPR

## 2015-06-25 NOTE — Telephone Encounter (Signed)
Marisue Ivan called from [facility]/ [phone]//to give Auth Request// CPT code 70553/ MRI W/WO Contrast//Tax # [id number]//NPI# [id number]

## 2015-06-25 NOTE — Telephone Encounter (Signed)
Lmovm to rtn my call. 

## 2015-06-25 NOTE — Telephone Encounter (Signed)
I spoke with Marisue Ivan. Requesting PA number for patient's MRI that is scheduled for 10/13. Will obtain Auth and give her a callback.

## 2015-06-26 ENCOUNTER — Ambulatory Visit (INDEPENDENT_AMBULATORY_CARE_PROVIDER_SITE_OTHER): Payer: Medicare Other | Admitting: Neurology

## 2015-06-26 DIAGNOSIS — R413 Other amnesia: Secondary | ICD-10-CM

## 2015-06-26 DIAGNOSIS — R4182 Altered mental status, unspecified: Secondary | ICD-10-CM | POA: Diagnosis not present

## 2015-06-27 DIAGNOSIS — R4182 Altered mental status, unspecified: Secondary | ICD-10-CM | POA: Diagnosis not present

## 2015-06-27 DIAGNOSIS — R413 Other amnesia: Secondary | ICD-10-CM | POA: Diagnosis not present

## 2015-06-28 ENCOUNTER — Other Ambulatory Visit: Payer: Self-pay | Admitting: Family Medicine

## 2015-06-28 NOTE — Procedures (Signed)
ELECTROENCEPHALOGRAM REPORT  Date of Study: 06/26/2015  Patient's Name: Tina Fitzpatrick MRN: [id number] Date of Birth: [birth year]  Referring Provider: Dr. Patrcia DollyKaren Jerrik Housholder  Clinical History: This is a 64 year old woman with worsening mental status and confusion  Medications: Xanax, Elavil, Tenormin, Flexeril, Fenofibrate, Lisinopril, Prilosec, Phenergan, Zoloft, Zocor, Ultram, Desyrel, Naprosyn  Technical Summary: A multichannel digital EEG recording measured by the international 10-20 system with electrodes applied with paste and impedances below 5000 ohms performed in our laboratory with EKG monitoring in an awake and asleep patient.  Hyperventilation and photic stimulation were performed.  The digital EEG was referentially recorded, reformatted, and digitally filtered in a variety of bipolar and referential montages for optimal display.    Description: The patient is awake and asleep during the recording.  During maximal wakefulness, there is a symmetric, medium voltage 10 Hz posterior dominant rhythm that attenuates with eye opening.  The record is symmetric.  During drowsiness and sleep, there is an increase in theta slowing of the background.  Vertex waves and symmetric sleep spindles were seen.  Hyperventilation and photic stimulation did not elicit any abnormalities.  There were no epileptiform discharges or electrographic seizures seen.    EKG lead was unremarkable.  Impression: This awake and asleep EEG is normal.    Clinical Correlation: A normal EEG does not exclude a clinical diagnosis of epilepsy.  If further clinical questions remain, prolonged EEG may be helpful.  Clinical correlation is advised.   Patrcia DollyKaren [name], M.D.

## 2015-06-28 NOTE — Telephone Encounter (Signed)
Medication filled to pharmacy as requested.   

## 2015-07-29 ENCOUNTER — Ambulatory Visit (INDEPENDENT_AMBULATORY_CARE_PROVIDER_SITE_OTHER): Payer: Medicare Other | Admitting: Neurology

## 2015-07-29 ENCOUNTER — Encounter: Payer: Self-pay | Admitting: Neurology

## 2015-07-29 VITALS — BP 120/76 | HR 98 | Resp 16 | Wt 174.0 lb

## 2015-07-29 DIAGNOSIS — F0390 Unspecified dementia without behavioral disturbance: Secondary | ICD-10-CM | POA: Diagnosis not present

## 2015-07-29 DIAGNOSIS — R4182 Altered mental status, unspecified: Secondary | ICD-10-CM | POA: Diagnosis not present

## 2015-07-29 DIAGNOSIS — F03A0 Unspecified dementia, mild, without behavioral disturbance, psychotic disturbance, mood disturbance, and anxiety: Secondary | ICD-10-CM | POA: Insufficient documentation

## 2015-07-29 NOTE — Progress Notes (Signed)
NEUROLOGY FOLLOW UP OFFICE NOTE  Tina Fitzpatrick [id number]  HISTORY OF PRESENT ILLNESS: I had the pleasure of seeing Tina Fitzpatrick in follow-up in the neurology clinic on 07/29/2015. She is again accompanied by her husband who helps supplement the history today.  The patient was last seen a month ago for memory loss and confusion. MMSE 19/30. Records and images were personally reviewed where available.  I personally reviewed MRI brain with and without contrast which did not show any acute changes, there was mild diffuse atrophy and mild chronic microvascular disease. Her wake and sleep EEG was normal. No change in memory problems, her husband expressed concern about medication affecting cognition, she states she takes Xanax 3-4 times a day and is unsure about the Trazodone at night. She does not drive. She denies any headaches, dizziness, diplopia, dysarthria, dysphagia, neck/back pain, focal numbness/tingling/weakness, bowel/bladder dysfunction, tremors, or anosmia. No falls.   Laboratory Data: Component     Latest Ref Rng 06/24/2015  Sed Rate     0 - 22 mm/hr 33 (H)  RPR     NON REAC NON REAC  HIV nonreactive  HPI: This is a pleasant 64 yo RH woman with a history of hypertension, hyperlipidemia, depression, anxiety, who presented with a 1-1/2 year history of memory loss and confusion. Her husband supplies majority of the history, she became upset stating she did not know about what he was talking about. Her husband reports she was in her usual state of health until 1-1/2 years ago when she started becoming more forgetful, putting things in the wrong place, unable to recall 10 minutes later if she had already taken her medication. She has gotten ice cream out of the freezer then puts it back in the fridge, or gets a drink and lays it on the floor and does not recall doing this. She stopped driving 2 years ago because she got lost coming back from her sister's house in [place]. She has  confusion "all the time," worsening on a daily basis. She reports that he gets upset with her and "hollers at me." The husband on the other hand, states that she talks and he has not idea what she is talking about, making her upset, then the situation worsens when he tries to help her. Over the past 6-7 months, she would "talk out of her head," one time calling her husband at work saying she with with her daughter (who lives in [address]), stating she drove to see her. He does not help her with her medications and thinks she takes them, she states she has no idea what the medications are for, and gets irate at her husband. She had called her PCP's office last 04/10/15, with note of slurred speech and confusion. She stated she did not know where she was, she was in a blue and yellow room and that was all she knew. She was difficult to understand, and reported she was scared and her husband was at work. Staff called 911 and patient was brought to [facility] ER where she was diagnosed with a UTI. CBC and BMP were unremarkable. I personally reviewed head CT without contrast which was normal. She saw her PCP and had more bloodwork done, including normal B12, folate, TSH. She reports seeing neurologist Dr. Hyacinth Meeker in the past, but does not recall what he was seeing her for. Her husband reports he was treating her for neck pain. She herself feels her memory is not so good, she feels  she can remember things but the words do not go where they are supposed to. She denies any history of concussions, no alcohol use. No recent medication changes, she reports taking Xanax every 6 hours for anxiety. Her mother had memory changes.   PAST MEDICAL HISTORY: Past Medical History  Diagnosis Date  . GERD (gastroesophageal reflux disease)   . Hypertension   . Hyperlipidemia   . Depression   . DDD (degenerative disc disease), cervical   . Kidney stones     MEDICATIONS: Current Outpatient Prescriptions on File Prior to Visit    Medication Sig Dispense Refill  . ALPRAZolam (XANAX) 0.5 MG tablet TAKE ONE TABLET BY MOUTH 4 TIMES DAILY AS NEEDED FOR ANXIETY    . amitriptyline (ELAVIL) 10 MG tablet Take 20 mg by mouth at bedtime as needed.    Marland Kitchen aspirin 81 MG tablet Take 81 mg by mouth daily.    Marland Kitchen atenolol (TENORMIN) 50 MG tablet TAKE ONE TABLET BY MOUTH ONCE DAILY 30 tablet 6  . cyclobenzaprine (FLEXERIL) 10 MG tablet TAKE ONE TABLET BY MOUTH THREE TIMES DAILY AS NEEDED FOR MUSCLE SPASM 30 tablet 2  . fenofibrate 160 MG tablet TAKE ONE TABLET BY MOUTH ONCE DAILY 30 tablet 6  . lisinopril (PRINIVIL,ZESTRIL) 5 MG tablet TAKE ONE TABLET BY MOUTH ONCE DAILY 30 tablet 6  . naproxen (NAPROSYN) 500 MG tablet TAKE ONE TABLET BY MOUTH TWICE DAILY WITH MEALS 60 tablet 5  . omeprazole (PRILOSEC) 40 MG capsule TAKE ONE CAPSULE BY MOUTH ONCE DAILY 30 capsule 3  . promethazine (PHENERGAN) 12.5 MG tablet PT NEEDS OFFICE VISIT.  Take one tablet by mouth every 8 hours as needed for nausea. 30 tablet 0  . sertraline (ZOLOFT) 100 MG tablet Take 100 mg by mouth 2 (two) times daily.    . simvastatin (ZOCOR) 40 MG tablet TAKE ONE TABLET BY MOUTH IN THE EVENING 90 tablet 1  . traMADol (ULTRAM) 50 MG tablet TAKE ONE TABLET BY MOUTH EVERY 6 HOURS AS NEEDED FOR PAIN 90 tablet 3  . traZODone (DESYREL) 50 MG tablet TAKE ONE-HALF TABLET BY MOUTH AT BEDTIME AS NEEDED FOR SLEEP 30 tablet 0   No current facility-administered medications on file prior to visit.    ALLERGIES: No Known Allergies  FAMILY HISTORY: No family history on file.  SOCIAL HISTORY: Social History   Social History  . Marital Status: Married    Spouse Name: N/A  . Number of Children: 2  . Years of Education: N/A   Occupational History  . Not on file.   Social History Main Topics  . Smoking status: Former Smoker    Types: Cigarettes  . Smokeless tobacco: Never Used  . Alcohol Use: No  . Drug Use: No  . Sexual Activity: Not on file   Other Topics Concern  . Not  on file   Social History Narrative   Married with 2 children   Right handed    12 th grade    1 cup daily    REVIEW OF SYSTEMS: Constitutional: No fevers, chills, or sweats, no generalized fatigue, change in appetite Eyes: No visual changes, double vision, eye pain Ear, nose and throat: No hearing loss, ear pain, nasal congestion, sore throat Cardiovascular: No chest pain, palpitations Respiratory:  No shortness of breath at rest or with exertion, wheezes GastrointestinaI: No nausea, vomiting, diarrhea, abdominal pain, fecal incontinence Genitourinary:  No dysuria, urinary retention or frequency Musculoskeletal:  No neck pain, back pain Integumentary: No rash,  pruritus, skin lesions Neurological: as above Psychiatric: No depression, insomnia, anxiety Endocrine: No palpitations, fatigue, diaphoresis, mood swings, change in appetite, change in weight, increased thirst Hematologic/Lymphatic:  No anemia, purpura, petechiae. Allergic/Immunologic: no itchy/runny eyes, nasal congestion, recent allergic reactions, rashes  PHYSICAL EXAM: Filed Vitals:   07/29/15 1113  BP: 120/76  Pulse: 98  Resp: 16   General: No acute distress Head:  Normocephalic/atraumatic Neck: supple, no paraspinal tenderness, full range of motion Heart:  Regular rate and rhythm Lungs:  Clear to auscultation bilaterally Back: No paraspinal tenderness Skin/Extremities: No rash, no edema Neurological Exam: alert and oriented to person, place, states it is the beginning of November, year 2009. Does not know president. No aphasia or dysarthria. Fund of knowledge is appropriate.  Immediate, recent and remote memory are impaired. 1/3 delayed recall. Attention and concentration are normal.    Able to name objects and repeat phrases. Cranial nerves: Pupils equal, round, reactive to light.  Fundoscopic exam unremarkable, no papilledema. Extraocular movements intact with no nystagmus. Visual fields full. Facial sensation  intact. No facial asymmetry. Tongue, uvula, palate midline.  Motor: Bulk and tone normal, muscle strength 5/5 throughout with no pronator drift.  Sensation to light touch intact.  No extinction to double simultaneous stimulation.  Deep tendon reflexes 2+ throughout, toes downgoing.  Finger to nose testing intact.  Gait narrow-based and steady, able to tandem walk adequately.  Romberg negative.  IMPRESSION: This is a pleasant 64 yo RH woman with a history of hypertension, hyperlipidemia, depression, anxiety, with worsening mental status over the past year or so. She was brought to the ER for similar symptoms last July. The etiology of symptoms is unclear, possible early onset dementia, however with the fairly rapid onset and MMSE of 19/30, other considerations include paraneoplastic/autoimmune encephalopathy. MRI brain unremarkable except for mild diffuse atrophy. EEG normal. She will be scheduled for a lumbar puncture, send for CSF cell count, glucose, protein, ACE, VDRL, oligoclonal bands, gram stain and culture, fungal culture, crypto Ag, protein 14-3-3. She will also be referred for Neuropsychological evaluation to further evaluate her cognitive status. Her husband is concerned that she is not taking medications as prescribed, particularly the benzodiazepines and Trazodone, which could potentially cause cognitive side effects. Home health will be contacted to help with medication administration. She will follow-up after the tests.   Thank you for allowing me to participate in her care.  Please do not hesitate to call for any questions or concerns.  The duration of this appointment visit was 25 minutes of face-to-face time with the patient.  Greater than 50% of this time was spent in counseling, explanation of diagnosis, planning of further management, and coordination of care.   Patrcia DollyKaren Takuya Lariccia, M.D.   CC: Dr. Beverely Lowabori

## 2015-07-29 NOTE — Patient Instructions (Signed)
1. Schedule lumbar puncture under fluoroscopy 2. Refer for Neuropsychological evaluation at [facility] 3. Refer to Home Health for medication administration 4. Discuss medications (Xanax, Trazodone) with Dr. Beverely Lowabori 5. Follow-up in 2 months

## 2015-07-30 ENCOUNTER — Telehealth: Payer: Self-pay | Admitting: Neurology

## 2015-07-30 NOTE — Telephone Encounter (Signed)
Noted. [facility].

## 2015-07-30 NOTE — Addendum Note (Signed)
Addended by: Franciso BendMCNEIL, Mickey Esguerra M on: 07/30/2015 08:39 AM   Modules accepted: Orders

## 2015-07-30 NOTE — Telephone Encounter (Signed)
Tina Fitzpatrick called with [facility] in regards to PT and said they will have to decline because they do not accept her insurance/Dawn CB# [phone]

## 2015-07-31 ENCOUNTER — Telehealth: Payer: Self-pay | Admitting: Neurology

## 2015-07-31 ENCOUNTER — Other Ambulatory Visit: Payer: Self-pay | Admitting: Family Medicine

## 2015-07-31 NOTE — Telephone Encounter (Signed)
Medication filled to pharmacy as requested.   

## 2015-07-31 NOTE — Telephone Encounter (Signed)
Purnell ShoemakerKaye from [facility]/ [phone]//called to inform that they cannot except Tina Fitzpatrick for a patient

## 2015-08-01 NOTE — Telephone Encounter (Signed)
Noted. Will try another home health agency.

## 2015-08-02 DIAGNOSIS — G3184 Mild cognitive impairment, so stated: Secondary | ICD-10-CM | POA: Diagnosis not present

## 2015-08-06 ENCOUNTER — Ambulatory Visit
Admission: RE | Admit: 2015-08-06 | Discharge: 2015-08-06 | Disposition: A | Discharge status: Home / Self Care | Payer: Medicare Other | Source: Ambulatory Visit | Attending: Neurology | Admitting: Neurology

## 2015-08-06 ENCOUNTER — Other Ambulatory Visit: Payer: Self-pay | Admitting: Neurology

## 2015-08-06 ENCOUNTER — Other Ambulatory Visit (HOSPITAL_COMMUNITY)
Admission: RE | Admit: 2015-08-06 | Discharge: 2015-08-06 | Disposition: A | Discharge status: Home / Self Care | Payer: Medicare Other | Source: Ambulatory Visit | Attending: Neurology | Admitting: Neurology

## 2015-08-06 DIAGNOSIS — R836 Abnormal cytological findings in cerebrospinal fluid: Secondary | ICD-10-CM | POA: Diagnosis not present

## 2015-08-06 DIAGNOSIS — F0390 Unspecified dementia without behavioral disturbance: Secondary | ICD-10-CM | POA: Diagnosis not present

## 2015-08-06 DIAGNOSIS — R4182 Altered mental status, unspecified: Secondary | ICD-10-CM | POA: Insufficient documentation

## 2015-08-06 DIAGNOSIS — F03A0 Unspecified dementia, mild, without behavioral disturbance, psychotic disturbance, mood disturbance, and anxiety: Secondary | ICD-10-CM

## 2015-08-06 LAB — CSF CELL COUNT WITH DIFFERENTIAL
RBC Count, CSF: 0 cu mm
Tube #: 3
WBC, CSF: 1 cu mm (ref 0–5)

## 2015-08-06 LAB — GLUCOSE, CSF: Glucose, CSF: 64 mg/dL (ref 43–76)

## 2015-08-06 LAB — PROTEIN, CSF: TOTAL PROTEIN, CSF: 38 mg/dL (ref 15–45)

## 2015-08-06 NOTE — Discharge Instructions (Signed)

## 2015-08-06 NOTE — Progress Notes (Signed)
1 SST tube drawn from right AC to go with spinal fluid. Site is unremarkable and pt tolerated procedure well. 

## 2015-08-08 LAB — CRYPTOCOCCAL AG, LTX SCR RFLX TITER: Cryptococcal Ag Screen: NOT DETECTED

## 2015-08-08 LAB — VDRL, CSF: SYPHILIS VDRL QUANT CSF: NONREACTIVE

## 2015-08-09 DIAGNOSIS — G3184 Mild cognitive impairment, so stated: Secondary | ICD-10-CM | POA: Diagnosis not present

## 2015-08-09 LAB — CSF CULTURE
GRAM STAIN: NONE SEEN
GRAM STAIN: NONE SEEN
ORGANISM ID, BACTERIA: NO GROWTH

## 2015-08-11 LAB — LEUKEMIA/LYMPHOMA EVALUATION PANEL
NUMBER OF MARKERS: 14
VIABILITY: 25 %

## 2015-08-16 DIAGNOSIS — G3184 Mild cognitive impairment, so stated: Secondary | ICD-10-CM | POA: Diagnosis not present

## 2015-08-16 DIAGNOSIS — R4182 Altered mental status, unspecified: Secondary | ICD-10-CM | POA: Diagnosis not present

## 2015-08-16 DIAGNOSIS — F0390 Unspecified dementia without behavioral disturbance: Secondary | ICD-10-CM | POA: Diagnosis not present

## 2015-08-16 LAB — OLIGOCLONAL BANDS, CSF + SERM

## 2015-08-16 LAB — MYELIN BASIC PROTEIN, CSF: Myelin Basic Protein: <2 ug/L (ref 0.0–4.0)

## 2015-09-01 LAB — FUNGUS CULTURE W SMEAR: SMEAR RESULT: NONE SEEN

## 2015-10-30 ENCOUNTER — Emergency Department (HOSPITAL_COMMUNITY): Payer: Medicare Other

## 2015-10-30 ENCOUNTER — Encounter (HOSPITAL_COMMUNITY): Payer: Self-pay | Admitting: Emergency Medicine

## 2015-10-30 ENCOUNTER — Inpatient Hospital Stay (HOSPITAL_COMMUNITY)
Admission: EM | Admit: 2015-10-30 | Discharge: 2015-11-02 | DRG: 689 | Disposition: A | Discharge status: Home / Self Care | Payer: Medicare Other | Attending: Internal Medicine | Admitting: Internal Medicine

## 2015-10-30 DIAGNOSIS — N390 Urinary tract infection, site not specified: Secondary | ICD-10-CM | POA: Diagnosis not present

## 2015-10-30 DIAGNOSIS — F09 Unspecified mental disorder due to known physiological condition: Secondary | ICD-10-CM | POA: Diagnosis not present

## 2015-10-30 DIAGNOSIS — R55 Syncope and collapse: Secondary | ICD-10-CM | POA: Diagnosis not present

## 2015-10-30 DIAGNOSIS — E785 Hyperlipidemia, unspecified: Secondary | ICD-10-CM | POA: Diagnosis present

## 2015-10-30 DIAGNOSIS — F32A Depression, unspecified: Secondary | ICD-10-CM | POA: Diagnosis present

## 2015-10-30 DIAGNOSIS — I951 Orthostatic hypotension: Secondary | ICD-10-CM | POA: Diagnosis not present

## 2015-10-30 DIAGNOSIS — R6889 Other general symptoms and signs: Secondary | ICD-10-CM | POA: Diagnosis not present

## 2015-10-30 DIAGNOSIS — R7989 Other specified abnormal findings of blood chemistry: Secondary | ICD-10-CM | POA: Diagnosis not present

## 2015-10-30 DIAGNOSIS — Z7982 Long term (current) use of aspirin: Secondary | ICD-10-CM | POA: Diagnosis not present

## 2015-10-30 DIAGNOSIS — F329 Major depressive disorder, single episode, unspecified: Secondary | ICD-10-CM | POA: Diagnosis present

## 2015-10-30 DIAGNOSIS — Z87891 Personal history of nicotine dependence: Secondary | ICD-10-CM

## 2015-10-30 DIAGNOSIS — K219 Gastro-esophageal reflux disease without esophagitis: Secondary | ICD-10-CM | POA: Diagnosis not present

## 2015-10-30 DIAGNOSIS — F0390 Unspecified dementia without behavioral disturbance: Secondary | ICD-10-CM | POA: Diagnosis present

## 2015-10-30 DIAGNOSIS — E538 Deficiency of other specified B group vitamins: Secondary | ICD-10-CM | POA: Diagnosis not present

## 2015-10-30 DIAGNOSIS — R748 Abnormal levels of other serum enzymes: Secondary | ICD-10-CM | POA: Diagnosis present

## 2015-10-30 DIAGNOSIS — F419 Anxiety disorder, unspecified: Secondary | ICD-10-CM | POA: Diagnosis present

## 2015-10-30 DIAGNOSIS — G9341 Metabolic encephalopathy: Secondary | ICD-10-CM | POA: Diagnosis not present

## 2015-10-30 DIAGNOSIS — I10 Essential (primary) hypertension: Secondary | ICD-10-CM | POA: Diagnosis present

## 2015-10-30 DIAGNOSIS — R413 Other amnesia: Secondary | ICD-10-CM | POA: Diagnosis not present

## 2015-10-30 DIAGNOSIS — E876 Hypokalemia: Secondary | ICD-10-CM | POA: Diagnosis present

## 2015-10-30 DIAGNOSIS — R778 Other specified abnormalities of plasma proteins: Secondary | ICD-10-CM

## 2015-10-30 DIAGNOSIS — R42 Dizziness and giddiness: Secondary | ICD-10-CM | POA: Diagnosis not present

## 2015-10-30 DIAGNOSIS — R296 Repeated falls: Secondary | ICD-10-CM | POA: Diagnosis not present

## 2015-10-30 DIAGNOSIS — I959 Hypotension, unspecified: Secondary | ICD-10-CM

## 2015-10-30 LAB — CBC
HCT: 43.3 % (ref 36.0–46.0)
HCT: 40.9 % (ref 36.0–46.0)
Hemoglobin: 14.3 g/dL (ref 12.0–15.0)
Hemoglobin: 13.5 g/dL (ref 12.0–15.0)
MCH: 27.9 pg (ref 26.0–34.0)
MCH: 27.8 pg (ref 26.0–34.0)
MCHC: 33 g/dL (ref 30.0–36.0)
MCHC: 33 g/dL (ref 30.0–36.0)
MCV: 84.6 fL (ref 78.0–100.0)
MCV: 84.3 fL (ref 78.0–100.0)
PLATELETS: 168 10*3/uL (ref 150–400)
PLATELETS: 118 10*3/uL — AB (ref 150–400)
RBC: 5.12 MIL/uL — ABNORMAL HIGH (ref 3.87–5.11)
RBC: 4.85 MIL/uL (ref 3.87–5.11)
RDW: 13.7 % (ref 11.5–15.5)
RDW: 13.6 % (ref 11.5–15.5)
WBC: 7.3 10*3/uL (ref 4.0–10.5)
WBC: 6.4 10*3/uL (ref 4.0–10.5)

## 2015-10-30 LAB — URINALYSIS, ROUTINE W REFLEX MICROSCOPIC
GLUCOSE, UA: NEGATIVE mg/dL
KETONES UR: 15 mg/dL — AB
Nitrite: POSITIVE — AB
PH: 6 (ref 5.0–8.0)
Protein, ur: 30 mg/dL — AB
Specific Gravity, Urine: 1.02 (ref 1.005–1.030)

## 2015-10-30 LAB — COMPREHENSIVE METABOLIC PANEL
ALBUMIN: 3.9 g/dL (ref 3.5–5.0)
ALK PHOS: 52 U/L (ref 38–126)
ALT: 17 U/L (ref 14–54)
AST: 23 U/L (ref 15–41)
Anion gap: 19 — ABNORMAL HIGH (ref 5–15)
BILIRUBIN TOTAL: 0.9 mg/dL (ref 0.3–1.2)
BUN: 15 mg/dL (ref 6–20)
CALCIUM: 9.8 mg/dL (ref 8.9–10.3)
CO2: 27 mmol/L (ref 22–32)
CREATININE: 0.96 mg/dL (ref 0.44–1.00)
Chloride: 95 mmol/L — ABNORMAL LOW (ref 101–111)
GFR calc Af Amer: >60 mL/min (ref >60)
GLUCOSE: 126 mg/dL — AB (ref 65–99)
Potassium: 2.4 mmol/L — CL (ref 3.5–5.1)
Sodium: 141 mmol/L (ref 135–145)
TOTAL PROTEIN: 6.9 g/dL (ref 6.5–8.1)

## 2015-10-30 LAB — CREATININE, SERUM
CREATININE: 0.88 mg/dL (ref 0.44–1.00)
GFR calc Af Amer: >60 mL/min (ref >60)
GFR calc non Af Amer: >60 mL/min (ref >60)

## 2015-10-30 LAB — URINE MICROSCOPIC-ADD ON

## 2015-10-30 LAB — TROPONIN I
TROPONIN I: 0.03 ng/mL (ref <0.031)
Troponin I: 0.05 ng/mL — ABNORMAL HIGH (ref <0.031)

## 2015-10-30 LAB — RAPID URINE DRUG SCREEN, HOSP PERFORMED
AMPHETAMINES: NOT DETECTED
BARBITURATES: NOT DETECTED
Benzodiazepines: POSITIVE — AB
Cocaine: NOT DETECTED
OPIATES: NOT DETECTED
TETRAHYDROCANNABINOL: NOT DETECTED

## 2015-10-30 LAB — CBG MONITORING, ED: GLUCOSE-CAPILLARY: 118 mg/dL — AB (ref 65–99)

## 2015-10-30 LAB — CK: CK TOTAL: 68 U/L (ref 38–234)

## 2015-10-30 LAB — ETHANOL: Alcohol, Ethyl (B): <5 mg/dL (ref <5)

## 2015-10-30 MED ORDER — DEXTROSE 5 % IV SOLN
1.0000 g | INTRAVENOUS | Status: DC
  Administered 2015-10-31 – 2015-11-01 (×2): 1 g via INTRAVENOUS
  Filled 2015-10-30 (×3): qty 10

## 2015-10-30 MED ORDER — SODIUM CHLORIDE 0.9% FLUSH
3.0000 mL | Freq: Two times a day (BID) | INTRAVENOUS | Status: DC
  Administered 2015-10-31 – 2015-11-02 (×4): 3 mL via INTRAVENOUS

## 2015-10-30 MED ORDER — FENOFIBRATE 160 MG PO TABS
160.0000 mg | ORAL_TABLET | Freq: Every day | ORAL | Status: DC
  Administered 2015-10-31 – 2015-11-02 (×4): 160 mg via ORAL
  Filled 2015-10-30 (×4): qty 1

## 2015-10-30 MED ORDER — SODIUM CHLORIDE 0.9 % IV BOLUS (SEPSIS)
1000.0000 mL | Freq: Once | INTRAVENOUS | Status: AC
  Administered 2015-10-30: 1000 mL via INTRAVENOUS

## 2015-10-30 MED ORDER — ATENOLOL 50 MG PO TABS
50.0000 mg | ORAL_TABLET | Freq: Every day | ORAL | Status: DC
Start: 2015-10-30 — End: 2015-11-02
  Administered 2015-10-30 – 2015-11-02 (×4): 50 mg via ORAL
  Filled 2015-10-30 (×4): qty 1

## 2015-10-30 MED ORDER — SERTRALINE HCL 100 MG PO TABS
100.0000 mg | ORAL_TABLET | Freq: Two times a day (BID) | ORAL | Status: DC
  Administered 2015-10-31 (×2): 100 mg via ORAL
  Filled 2015-10-30 (×2): qty 1

## 2015-10-30 MED ORDER — PANTOPRAZOLE SODIUM 40 MG PO TBEC
40.0000 mg | DELAYED_RELEASE_TABLET | Freq: Every day | ORAL | Status: DC
  Administered 2015-10-30 – 2015-11-02 (×4): 40 mg via ORAL
  Filled 2015-10-30 (×4): qty 1

## 2015-10-30 MED ORDER — SODIUM CHLORIDE 0.9 % IV SOLN
INTRAVENOUS | Status: AC
  Administered 2015-10-30 – 2015-10-31 (×3): via INTRAVENOUS

## 2015-10-30 MED ORDER — ASPIRIN EC 81 MG PO TBEC
81.0000 mg | DELAYED_RELEASE_TABLET | Freq: Every day | ORAL | Status: DC
  Administered 2015-10-31 – 2015-11-02 (×4): 81 mg via ORAL
  Filled 2015-10-30 (×4): qty 1

## 2015-10-30 MED ORDER — ONDANSETRON HCL 4 MG PO TABS: 4.0000 mg | ORAL_TABLET | Freq: Four times a day (QID) | ORAL | Status: DC | PRN

## 2015-10-30 MED ORDER — SIMVASTATIN 40 MG PO TABS
40.0000 mg | ORAL_TABLET | Freq: Every evening | ORAL | Status: DC
  Administered 2015-10-31 – 2015-11-01 (×3): 40 mg via ORAL
  Filled 2015-10-30 (×3): qty 1

## 2015-10-30 MED ORDER — POLYETHYLENE GLYCOL 3350 17 G PO PACK
17.0000 g | PACK | Freq: Every day | ORAL | Status: DC | PRN
Start: 2015-10-30 — End: 2015-11-02

## 2015-10-30 MED ORDER — ACETAMINOPHEN 325 MG PO TABS: 650.0000 mg | ORAL_TABLET | Freq: Four times a day (QID) | ORAL | Status: DC | PRN

## 2015-10-30 MED ORDER — ALPRAZOLAM 0.5 MG PO TABS: 0.5000 mg | ORAL_TABLET | Freq: Three times a day (TID) | ORAL | Status: DC | PRN

## 2015-10-30 MED ORDER — HEPARIN SODIUM (PORCINE) 5000 UNIT/ML IJ SOLN
5000.0000 [IU] | Freq: Three times a day (TID) | INTRAMUSCULAR | Status: DC
  Administered 2015-10-31 – 2015-11-02 (×8): 5000 [IU] via SUBCUTANEOUS
  Filled 2015-10-30 (×8): qty 1

## 2015-10-30 MED ORDER — ACETAMINOPHEN 650 MG RE SUPP: 650.0000 mg | Freq: Four times a day (QID) | RECTAL | Status: DC | PRN

## 2015-10-30 MED ORDER — DEXTROSE 5 % IV SOLN: 1.0000 g | INTRAVENOUS | Status: DC

## 2015-10-30 MED ORDER — SODIUM CHLORIDE 0.9 % IV SOLN: INTRAVENOUS | Status: DC

## 2015-10-30 MED ORDER — POTASSIUM CHLORIDE 10 MEQ/100ML IV SOLN
10.0000 meq | Freq: Once | INTRAVENOUS | Status: AC
  Administered 2015-10-30: 10 meq via INTRAVENOUS
  Filled 2015-10-30: qty 100

## 2015-10-30 MED ORDER — CITALOPRAM HYDROBROMIDE 10 MG PO TABS: 10.0000 mg | ORAL_TABLET | Freq: Every day | ORAL | Status: DC

## 2015-10-30 MED ORDER — DEXTROSE 5 % IV SOLN
1.0000 g | Freq: Once | INTRAVENOUS | Status: AC
  Administered 2015-10-30: 1 g via INTRAVENOUS
  Filled 2015-10-30: qty 10

## 2015-10-30 MED ORDER — ONDANSETRON HCL 4 MG/2ML IJ SOLN: 4.0000 mg | Freq: Four times a day (QID) | INTRAMUSCULAR | Status: DC | PRN

## 2015-10-30 MED ORDER — POTASSIUM CHLORIDE CRYS ER 20 MEQ PO TBCR
40.0000 meq | EXTENDED_RELEASE_TABLET | Freq: Three times a day (TID) | ORAL | Status: AC
  Administered 2015-10-31 (×3): 40 meq via ORAL
  Filled 2015-10-30 (×3): qty 2

## 2015-10-30 NOTE — ED Notes (Signed)
Attempted report x1. 

## 2015-10-30 NOTE — ED Notes (Signed)
CBG 118 

## 2015-10-30 NOTE — H&P (Signed)
[facility] History and Physical     History and Physical:    Tina Fitzpatrick   ZOX:[id number] DOB: 1951-01-21 DOA: 10/30/2015  Referring MD/provider: Shelva Majestic PCP: Neena Rhymes, MD   Chief Complaint: confusion  History of Present Illness:   Tina Fitzpatrick is an 65 y.o. female past medical history memory disorder comes in for confusion and a fall that happened on the day of admission. As per husband S patient cannot give a history as she is confused he relates he came home and she had stooled on herself. She did not recognize him this morning. And when he got her up to go to the bathroom fell to the floor. He relates she felt clammy but was still confused. He denies she's had any fever, chills, cough, complain of shortness of breath nausea vomiting or diarrhea.  On a different note she has been followed up by her primary care doctor for memory loss for the past 6 weeks. As per husband he relates she has been forgetting things that they talk about. She has lost interest in things that she has enjoys. She doesn't want to come out of the house and has had a decrease in her appetite.  ROS:   ROS  Constitutional: No fever, no chills;   No weight loss, no weight gain, no fatigue.   HEENT: No blurry vision, no diplopia, no pharyngitis, no dysphagia  CV: No chest pain, no palpitations, no PND, no orthopnea, no edema.   Resp: No SOB, no cough, no pleuritic pain.  GI: No nausea, no vomiting, no diarrhea, no melena, no hematochezia, no constipation, no abdominal pain.   GU: No dysuria, no hematuria, no frequency, no urgency.  MSK: No myalgias, no arthralgias.   Neuro:  No headache, no focal neurological deficits, no history of seizures.   Psych: No depression, no anxiety.   Endo: No heat intolerance, no cold intolerance, no polyuria, no polydipsia   Skin: No rashes, no skin lesions.   Heme: No easy bruising.   Travel history: No recent travel.   Past Medical History:   Past  Medical History  Diagnosis Date  . GERD (gastroesophageal reflux disease)   . Hypertension   . Hyperlipidemia   . Depression   . DDD (degenerative disc disease), cervical   . Kidney stones     Past Surgical History:   Past Surgical History  Procedure Laterality Date  . Neck surgery  2008    Plate/Screws  . Kidney stones  1988    Social History:   Social History   Social History  . Marital Status: Married    Spouse Name: N/A  . Number of Children: 2  . Years of Education: N/A   Occupational History  . Not on file.   Social History Main Topics  . Smoking status: Former Smoker    Types: Cigarettes  . Smokeless tobacco: Never Used  . Alcohol Use: No  . Drug Use: No  . Sexual Activity: Yes   Other Topics Concern  . Not on file   Social History Narrative   Married with 2 children   Right handed    12 th grade    1 cup daily    Family history:   Family History  Problem Relation Age of Onset  . Heart attack Mother     Allergies   Review of patient's allergies indicates no known allergies.  Current Medications:   Prior to Admission medications   Medication Sig Start Date  End Date Taking? Authorizing Provider  ALPRAZolam (XANAX) 0.5 MG tablet TAKE ONE TABLET BY MOUTH 4 TIMES DAILY AS NEEDED FOR ANXIETY 06/04/15  Yes Historical Provider, MD  amitriptyline (ELAVIL) 10 MG tablet Take 20 mg by mouth at bedtime as needed for sleep.    Yes Historical Provider, MD  aspirin 81 MG tablet Take 81 mg by mouth daily.   Yes Historical Provider, MD  atenolol (TENORMIN) 50 MG tablet TAKE ONE TABLET BY MOUTH ONCE DAILY 05/13/15  Yes Sheliah Hatch, MD  cyclobenzaprine (FLEXERIL) 10 MG tablet TAKE ONE TABLET BY MOUTH THREE TIMES DAILY AS NEEDED FOR MUSCLE SPASM 05/13/15  Yes Sheliah Hatch, MD  fenofibrate 160 MG tablet TAKE ONE TABLET BY MOUTH ONCE DAILY 07/31/15  Yes Sheliah Hatch, MD  lisinopril (PRINIVIL,ZESTRIL) 5 MG tablet TAKE ONE TABLET BY MOUTH ONCE  DAILY 06/28/15  Yes Sheliah Hatch, MD  naproxen (NAPROSYN) 500 MG tablet TAKE ONE TABLET BY MOUTH TWICE DAILY WITH MEALS 10/10/13  Yes Sheliah Hatch, MD  omeprazole (PRILOSEC) 40 MG capsule TAKE ONE CAPSULE BY MOUTH ONCE DAILY Patient taking differently: Take 40 mg by mouth daily.  10/09/14  Yes Sheliah Hatch, MD  promethazine (PHENERGAN) 12.5 MG tablet PT NEEDS OFFICE VISIT.  Take one tablet by mouth every 8 hours as needed for nausea. Patient taking differently: Take 12.5 mg by mouth every 8 (eight) hours as needed for nausea or vomiting.  08/22/13  Yes Sheliah Hatch, MD  sertraline (ZOLOFT) 100 MG tablet Take 100 mg by mouth 2 (two) times daily.   Yes Historical Provider, MD  simvastatin (ZOCOR) 40 MG tablet TAKE ONE TABLET BY MOUTH IN THE EVENING 06/28/15  Yes Sheliah Hatch, MD  traMADol (ULTRAM) 50 MG tablet TAKE ONE TABLET BY MOUTH EVERY 6 HOURS AS NEEDED FOR PAIN 05/10/12  Yes Sheliah Hatch, MD  traZODone (DESYREL) 50 MG tablet TAKE ONE-HALF TABLET BY MOUTH AT BEDTIME AS NEEDED FOR SLEEP 09/17/13  Yes Sheliah Hatch, MD    Physical Exam:   Filed Vitals:   10/30/15 1345 10/30/15 1400 10/30/15 1415 10/30/15 1430  BP: 125/73 110/95 131/93 142/67  Pulse: 65 61 59 59  Temp:      TempSrc:      Resp: SpO2: 96% 100% 100% 100%     Physical Exam: Blood pressure 142/67, pulse 59, temperature 97.3 F (36.3 C), temperature source Oral, resp. rate 20, SpO2 100 %. Gen: No acute distress. Awake alert and oriented 2 Head: Normocephalic, atraumatic. Eyes: PERRL, EOMI, sclerae nonicteric. Mouth: Oropharynx Neck: Supple, no thyromegaly, no lymphadenopathy, no jugular venous distention. Chest: She is good air movement and clear to auscultation CV: Regular rate and rhythm with positive S1-S2 appreciated murmurs rubs gallops Abdomen: Soft, nontender, with suprapubic tenderness no rebound or guarding. Extremities: Extremities Skin: Warm and dry. Neuro:  Alert and oriented times2; cranial nerves II through XII grossly intact. Psych: Mood and affect normal.   Data Review:    Labs: Basic Metabolic Panel:  Recent Labs Lab 10/30/15 1216  NA 141  K 2.4*  CL 95*  CO2 27  GLUCOSE 126*  BUN 15  CREATININE 0.96  CALCIUM 9.8   Liver Function Tests:  Recent Labs Lab 10/30/15 1216  AST 23  ALT 17  ALKPHOS 52  BILITOT 0.9  PROT 6.9  ALBUMIN 3.9   No results for input(s): LIPASE, AMYLASE in the last 168 hours. No results for input(s):  AMMONIA in the last 168 hours. CBC:  Recent Labs Lab 10/30/15 1216  WBC 7.3  HGB 14.3  HCT 43.3  MCV 84.6  PLT 168   Cardiac Enzymes:  Recent Labs Lab 10/30/15 1216  TROPONINI 0.05*    BNP (last 3 results) No results for input(s): PROBNP in the last 8760 hours. CBG:  Recent Labs Lab 10/30/15 1216  GLUCAP 118*    Radiographic Studies: Dg Chest 2 View  10/30/2015  CLINICAL DATA:  Syncopal episode and dizziness EXAM: CHEST  2 VIEW COMPARISON:  04/10/2015 FINDINGS: Cardiac shadow is within normal limits. Postsurgical changes are again seen in the cervical spine. The lungs are well aerated bilaterally. No focal infiltrate or sizable effusion is seen. No acute bony abnormality is noted. IMPRESSION: No active cardiopulmonary disease. Electronically Signed   By: Alcide Clever M.D.   On: 10/30/2015 13:30   Ct Head Wo Contrast  10/30/2015  CLINICAL DATA:  Dizziness and slurred speech EXAM: CT HEAD WITHOUT CONTRAST TECHNIQUE: Contiguous axial images were obtained from the base of the skull through the vertex without intravenous contrast. COMPARISON:  04/10/2015 FINDINGS: The bony calvarium is intact. No gross soft tissue abnormality is seen. Minimal atrophic changes are noted. No findings to suggest acute hemorrhage, acute infarction or space-occupying mass lesion are noted. IMPRESSION: Mild atrophic changes without acute abnormality. Electronically Signed   By: Alcide Clever M.D.   On:  10/30/2015 13:23   *I have personally reviewed the images above*  EKG: Independently reviewed. Sinus rhythm and T-wave inversion in inferior leads, normal axis normal intervals.   Assessment/Plan:   Encephalopathy, metabolic due to UTI (lower urinary tract infection): Her encephalopathy is likely due to UTI. She has significant suprapubic tenderness with positive nitrates, I agree with starting her on IV Rocephin. Urine cultures and blood cultures have already been ordered by the emergency room physician.  Essential hypertension/Orthostatic hypotension: I will hold Elavil, as this can cause orthostatic hypotension. Hold lisinopril, continue atenolol. Check urinary sodium urinary creatinine start her on IV fluids aggressively. Follow strict is and os.  Elevated troponins: She denies any chest pain or shortness of breath she had a mild elevation on her cardiac biomarkers. Her EKG just showed a T-wave inversion in lead 2 and aVF (which was present back in 04/03/2015), unchanged with previous EKG. Cycle cardiac enzymes check a CK unlikely ACS.  Newly  Depression She has a decreased appetite doesn't want to get out of her house, and has anhedonia. I think she is slightly depressed start her on Cymbalta.  Hypokalemia Likely due to hypovolemia replete orally recheck a basic metabolic pounding morning.    DVT prophylaxis Heparin  Code Status: Full. Family Communication:husband Disposition Plan: Home 3-5 days  Time spent: 65 min  Marinda Elk [facility] Pager [phone]  If 7PM-7AM, please contact night-coverage [email or web address] Password [facility] 10/30/2015, 3:18 PM

## 2015-10-30 NOTE — ED Notes (Signed)
Patient transported to CT in nad noted.

## 2015-10-30 NOTE — ED Notes (Addendum)
Pt returned from xray, husband at bedside and updated on plan of care.

## 2015-10-30 NOTE — ED Notes (Signed)
PA at bedside.

## 2015-10-30 NOTE — ED Notes (Signed)
Per EMS: pt from home for eval of syncopal episode and increased confusion since 10/29/15. Per husband of pt, pt was in bed all day not feeling well and today was incontinent of urine in bed which is unusual for pt. Husband tried to put pt on toilet and pt had syncopal episode, EMS noted positive orthostatic vitals and pt clammy PTA to ED. EMS notes pt with mild slurred speech per husband but states LSW was before bed on 10/29/15. Pt alert and oriented to person and place, confused to time and situation with repetitive questioning. Nadnoted.

## 2015-10-30 NOTE — ED Provider Notes (Signed)
CSN: [id number]     Arrival date & time 10/30/15  1156 History   First MD Initiated Contact with Patient 10/30/15 1203     Chief Complaint  Patient presents with  . Loss of Consciousness  . Altered Mental Status     (Consider location/radiation/quality/duration/timing/severity/associated sxs/prior Treatment) The history is provided by the patient and the spouse.     Pt with hx memory disorder reported to have syncopal episode and confusion, incontinence of urine in the bed.  Noted by EMS to be orthostatic.    When asked what has happened today pt responds "No clue."   She is unable to provide any details and does not remember any events today.  She denies any pain, nausea, SOB.  Does feel like she needs to urinate.  Reports sadness over her dog being given away two days ago.  Admits to depression, denies SI.   Level V caveat for confusion.   1:44 PM Husband now bedside. He notes patient spends almost the entire day in bed every day and rarely remembers anything 15 minutes after he tells her.  Has seen neurology for this without diagnosis.  Today he came home from work and found her in bed.  She told him she felt wet and he found her to have BM in bed with her.  He put her on the toilet and walked out to wash the sheets, then heard her hit the floor.  When he found her her eyes were open but she was not talking, then he called 911.  Per nursing, pt was orthostatic upon arrival, systolic in the 70s/80s.     Past Medical History  Diagnosis Date  . GERD (gastroesophageal reflux disease)   . Hypertension   . Hyperlipidemia   . Depression   . DDD (degenerative disc disease), cervical   . Kidney stones    Past Surgical History  Procedure Laterality Date  . Neck surgery  2008    Plate/Screws  . Kidney stones  1988   No family history on file. Social History  Substance Use Topics  . Smoking status: Former Smoker    Types: Cigarettes  . Smokeless tobacco: Never Used  . Alcohol  Use: No   OB History    No data available     Review of Systems  Unable to perform ROS: Mental status change      Allergies  Review of patient's allergies indicates no known allergies.  Home Medications   Prior to Admission medications   Medication Sig Start Date End Date Taking? Authorizing Provider  ALPRAZolam (XANAX) 0.5 MG tablet TAKE ONE TABLET BY MOUTH 4 TIMES DAILY AS NEEDED FOR ANXIETY 06/04/15   Historical Provider, MD  amitriptyline (ELAVIL) 10 MG tablet Take 20 mg by mouth at bedtime as needed.    Historical Provider, MD  aspirin 81 MG tablet Take 81 mg by mouth daily.    Historical Provider, MD  atenolol (TENORMIN) 50 MG tablet TAKE ONE TABLET BY MOUTH ONCE DAILY 05/13/15   Sheliah Hatch, MD  cyclobenzaprine (FLEXERIL) 10 MG tablet TAKE ONE TABLET BY MOUTH THREE TIMES DAILY AS NEEDED FOR MUSCLE SPASM 05/13/15   Sheliah Hatch, MD  fenofibrate 160 MG tablet TAKE ONE TABLET BY MOUTH ONCE DAILY 07/31/15   Sheliah Hatch, MD  lisinopril (PRINIVIL,ZESTRIL) 5 MG tablet TAKE ONE TABLET BY MOUTH ONCE DAILY 06/28/15   Sheliah Hatch, MD  naproxen (NAPROSYN) 500 MG tablet TAKE ONE TABLET BY MOUTH TWICE DAILY  WITH MEALS 10/10/13   Sheliah Hatch, MD  omeprazole (PRILOSEC) 40 MG capsule TAKE ONE CAPSULE BY MOUTH ONCE DAILY 10/09/14   Sheliah Hatch, MD  promethazine (PHENERGAN) 12.5 MG tablet PT NEEDS OFFICE VISIT.  Take one tablet by mouth every 8 hours as needed for nausea. 08/22/13   Sheliah Hatch, MD  sertraline (ZOLOFT) 100 MG tablet Take 100 mg by mouth 2 (two) times daily.    Historical Provider, MD  simvastatin (ZOCOR) 40 MG tablet TAKE ONE TABLET BY MOUTH IN THE EVENING 06/28/15   Sheliah Hatch, MD  traMADol (ULTRAM) 50 MG tablet TAKE ONE TABLET BY MOUTH EVERY 6 HOURS AS NEEDED FOR PAIN 05/10/12   Sheliah Hatch, MD  traZODone (DESYREL) 50 MG tablet TAKE ONE-HALF TABLET BY MOUTH AT BEDTIME AS NEEDED FOR SLEEP 09/17/13   Sheliah Hatch, MD    BP 133/75 mmHg  Pulse 63  Temp(Src) 97.3 F (36.3 C) (Oral)  Resp 13  SpO2 98% Physical Exam  Constitutional: She appears well-developed and well-nourished. No distress.  HENT:  Head: Normocephalic and atraumatic.  Neck: Neck supple.  Cardiovascular: Normal rate and regular rhythm.   Pulmonary/Chest: Effort normal and breath sounds normal. No respiratory distress. She has no wheezes. She has no rales.  Abdominal: Soft. She exhibits no distension. There is tenderness. There is no rebound and no guarding.  Neurological: She is alert. No cranial nerve deficit. She exhibits normal muscle tone. GCS eye subscore is 4. GCS verbal subscore is 5. GCS motor subscore is 6.  Oriented to self.  Knows it is February.  Unsure of day of the week or date.  Guessed the year was 2016.  Does not know the president. Knows she is in a hospital in [place].    Moves arms and legs equally.  Sensation grossly intact.    Skin: She is not diaphoretic.  Nursing note and vitals reviewed.   ED Course  Procedures (including critical care time) Labs Review Labs Reviewed  COMPREHENSIVE METABOLIC PANEL - Abnormal; Notable for the following:    Potassium 2.4 (*)    Chloride 95 (*)    Glucose, Bld 126 (*)    Anion gap 19 (*)    All other components within normal limits  CBC - Abnormal; Notable for the following:    RBC 5.12 (*)    All other components within normal limits  CBG MONITORING, ED - Abnormal; Notable for the following:    Glucose-Capillary 118 (*)    All other components within normal limits  URINALYSIS, ROUTINE W REFLEX MICROSCOPIC (NOT AT [facility])  TROPONIN I  ETHANOL  URINE RAPID DRUG SCREEN, HOSP PERFORMED    Imaging Review Dg Chest 2 View  10/30/2015  CLINICAL DATA:  Syncopal episode and dizziness EXAM: CHEST  2 VIEW COMPARISON:  04/10/2015 FINDINGS: Cardiac shadow is within normal limits. Postsurgical changes are again seen in the cervical spine. The lungs are well aerated bilaterally.  No focal infiltrate or sizable effusion is seen. No acute bony abnormality is noted. IMPRESSION: No active cardiopulmonary disease. Electronically Signed   By: Alcide Clever M.D.   On: 10/30/2015 13:30   Ct Head Wo Contrast  10/30/2015  CLINICAL DATA:  Dizziness and slurred speech EXAM: CT HEAD WITHOUT CONTRAST TECHNIQUE: Contiguous axial images were obtained from the base of the skull through the vertex without intravenous contrast. COMPARISON:  04/10/2015 FINDINGS: The bony calvarium is intact. No gross soft tissue abnormality is seen. Minimal atrophic changes  are noted. No findings to suggest acute hemorrhage, acute infarction or space-occupying mass lesion are noted. IMPRESSION: Mild atrophic changes without acute abnormality. Electronically Signed   By: Alcide Clever M.D.   On: 10/30/2015 13:23      EKG Interpretation   Date/Time:  Wednesday October 30 2015 16:20:53 EST Ventricular Rate:  65 PR Interval:  175 QRS Duration: 101 QT Interval:  483 QTC Calculation: 502 R Axis:   -37 Text Interpretation:  Sinus rhythm Left axis deviation Nonspecific T  abnormalities, inferior leads Prolonged QT interval Confirmed by Donnald Garre,  MD, Lebron Conners [phone]) on 10/30/2015 4:26:08 PM        MDM   Final diagnoses:  Syncope, unspecified syncope type  UTI (lower urinary tract infection)  Orthostatic hypotension  Elevated troponin  Hypokalemia   Afebrile nontoxic patient with episode of syncope, incontinence of urine, memory loss of the entire day/confusion.  Ongoing concern that patient spends all day in bed, has memory problems, tearful when discussing dog and admits to depression, denies SI.   Pt may benefit from psychiatric evaluation at some point.  Labs significant for hypokalemia, troponin 0.05.  UA appears infected.  CT head negative.  EKG initially with wandering baseline, repeated and is unchanged from prior.  Admitted to [facility], Dr David Stall accepting admission.         Trixie Dredge, PA-C 10/30/15 1628  Arby Barrette, MD 10/30/15 [phone]

## 2015-10-31 ENCOUNTER — Encounter (HOSPITAL_COMMUNITY): Payer: Self-pay | Admitting: General Practice

## 2015-10-31 DIAGNOSIS — R413 Other amnesia: Secondary | ICD-10-CM | POA: Insufficient documentation

## 2015-10-31 DIAGNOSIS — F09 Unspecified mental disorder due to known physiological condition: Secondary | ICD-10-CM

## 2015-10-31 DIAGNOSIS — F329 Major depressive disorder, single episode, unspecified: Secondary | ICD-10-CM

## 2015-10-31 LAB — COMPREHENSIVE METABOLIC PANEL
ALBUMIN: 3.2 g/dL — AB (ref 3.5–5.0)
ALK PHOS: 41 U/L (ref 38–126)
ALT: 14 U/L (ref 14–54)
ANION GAP: 13 (ref 5–15)
AST: 30 U/L (ref 15–41)
BILIRUBIN TOTAL: 1.4 mg/dL — AB (ref 0.3–1.2)
BUN: 12 mg/dL (ref 6–20)
CALCIUM: 9.1 mg/dL (ref 8.9–10.3)
CO2: 25 mmol/L (ref 22–32)
Chloride: 105 mmol/L (ref 101–111)
Creatinine, Ser: 0.77 mg/dL (ref 0.44–1.00)
GFR calc non Af Amer: >60 mL/min (ref >60)
Glucose, Bld: 97 mg/dL (ref 65–99)
POTASSIUM: 4.7 mmol/L (ref 3.5–5.1)
SODIUM: 143 mmol/L (ref 135–145)
TOTAL PROTEIN: 5.6 g/dL — AB (ref 6.5–8.1)

## 2015-10-31 LAB — CBC
HEMATOCRIT: 36.6 % (ref 36.0–46.0)
HEMOGLOBIN: 12.3 g/dL (ref 12.0–15.0)
MCH: 28.8 pg (ref 26.0–34.0)
MCHC: 33.6 g/dL (ref 30.0–36.0)
MCV: 85.7 fL (ref 78.0–100.0)
Platelets: 93 10*3/uL — ABNORMAL LOW (ref 150–400)
RBC: 4.27 MIL/uL (ref 3.87–5.11)
RDW: 13.8 % (ref 11.5–15.5)
WBC: 4.4 10*3/uL (ref 4.0–10.5)

## 2015-10-31 LAB — TROPONIN I
Troponin I: 0.05 ng/mL — ABNORMAL HIGH (ref <0.031)
Troponin I: 0.03 ng/mL (ref <0.031)

## 2015-10-31 LAB — AMMONIA: AMMONIA: 25 umol/L (ref 9–35)

## 2015-10-31 LAB — FOLATE: FOLATE: 5.2 ng/mL — AB (ref >5.9)

## 2015-10-31 LAB — VITAMIN B12: VITAMIN B 12: 380 pg/mL (ref 180–914)

## 2015-10-31 LAB — TSH: TSH: 4.351 u[IU]/mL (ref 0.350–4.500)

## 2015-10-31 MED ORDER — BUPROPION HCL ER (SR) 150 MG PO TB12
150.0000 mg | ORAL_TABLET | Freq: Every day | ORAL | Status: DC
  Administered 2015-10-31 – 2015-11-02 (×3): 150 mg via ORAL
  Filled 2015-10-31 (×4): qty 1

## 2015-10-31 MED ORDER — SERTRALINE HCL 100 MG PO TABS
100.0000 mg | ORAL_TABLET | Freq: Every day | ORAL | Status: DC
  Administered 2015-11-01 – 2015-11-02 (×2): 100 mg via ORAL
  Filled 2015-10-31 (×2): qty 1

## 2015-10-31 NOTE — Progress Notes (Signed)
1300 seen by psychiatrist .limited answers to  querries . Occasionally looks at husband when unable to give anseres to querries

## 2015-10-31 NOTE — Progress Notes (Signed)
[facility]                                                                              Patient Demographics  Tina Fitzpatrick, is a 65 y.o. female, DOB - 13-Nov-1950, ZOX:[id number]  Admit date - 10/30/2015   Admitting Physician Marinda Elk, MD  Outpatient Primary MD for the patient is Neena Rhymes, MD  LOS - 1   Chief Complaint  Patient presents with  . Loss of Consciousness  . Altered Mental Status       Brief HPI   Patient is a 65 year old female with GERD, hypertension, hyperlipidemia, depression presented to ED for confusion and fall. Patient was not able to provide much history, husband reported that patient had the feces on herself and did not recognize him on the morning of admission. Then he got her up to go to the bathroom, she fell to the floor, was clammy and confused. Patient has been followed up by her primary care physician and neurology for the memory loss for the past 6 weeks. ED workup showed unremarkable BMET, CBC with platelets 90 3K otherwise hemoglobin 12.3 UA positive for UTI, CT head negative for acute abnormalities Assessment & Plan    Acute on chronic Encephalopathy, likely worsened due to due to UTI (lower urinary tract infection): With underlying history of mild dementia - Continue IV Rocephin, follow urine culture and sensitivity - Patient has chronic cognitive deficits which has been extensively worked up by outpatient neurology, Dr. Karel Jarvis Including MRI of the brain negative for infarct except mild diffuse atrophy, EEG normal. CSF panel negative. She was recommended neuropsychological evaluation. Neurology and psychiatry consulted. -  obtain ammonia level, TSH, B12, folate, RPR  Essential hypertension/Orthostatic hypotension: - DC Elavil - Continue IV fluid hydration - Continue atenolol  Elevated troponins: She denies any chest pain or dyspnea, had mild elevation of troponin 0.05.  Her EKG just showed a T-wave  inversion in lead 2 and aVF (which was present back in 04/03/2015), unchanged with previous EKG. Cardiac enzymes with troponins trending down  Newly Depression - Psychiatry consulted. Also will need capacity evaluation.  Hypokalemia Likely due to hypovolemia replete orally recheck a basic metabolic pounding morning.   Code Status: Full code  Family Communication: Discussed in detail with the patient, all imaging results, lab results explained to the patient and husband at the bedside   Disposition Plan:   Time Spent in minutes   25 minutes  Procedures  CT head  Consults   Neuro psych  DVT Prophylaxis   heparin  Medications  Scheduled Meds: . aspirin EC  81 mg Oral Daily  . atenolol  50 mg Oral Daily  . buPROPion  150 mg Oral Daily  . cefTRIAXone (ROCEPHIN)  IV  1 g Intravenous Q24H  . fenofibrate  160 mg Oral Daily  . heparin  5,000 Units Subcutaneous 3 times per day  . pantoprazole  40 mg Oral Daily  . potassium chloride  40 mEq Oral TID  . [START ON 11/01/2015] sertraline  100 mg Oral Daily  . simvastatin  40 mg Oral QPM  . sodium chloride flush  3 mL Intravenous Q12H   Continuous Infusions: . sodium chloride 125 mL/hr at 10/31/15 1439   PRN Meds:.acetaminophen **OR** acetaminophen, ALPRAZolam, ondansetron **OR** ondansetron (ZOFRAN) IV, polyethylene glycol   Antibiotics   Anti-infectives    Start     Dose/Rate Route Frequency Ordered Stop   10/31/15 1430  cefTRIAXone (ROCEPHIN) 1 g in dextrose 5 % 50 mL IVPB     1 g 100 mL/hr over 30 Minutes Intravenous Every 24 hours 10/30/15 2053     10/30/15 2000  cefTRIAXone (ROCEPHIN) 1 g in dextrose 5 % 50 mL IVPB  Status:  Discontinued     1 g 100 mL/hr over 30 Minutes Intravenous Every 24 hours 10/30/15 1957 10/30/15 2053   10/30/15 1400  cefTRIAXone (ROCEPHIN) 1 g in dextrose 5 % 50 mL IVPB     1 g 100 mL/hr over 30 Minutes Intravenous  Once 10/30/15 1349 10/30/15 1726        Subjective:   Tina Fitzpatrick  was seen and examined today.  Alert and oriented to self and person. Patient denies dizziness, chest pain, shortness of breath, abdominal pain, N/V/D/C, new weakness, numbess, tingling. No acute events overnight.    Objective:   Filed Vitals:   10/30/15 2144 10/31/15 0542 10/31/15 1125 10/31/15 1132  BP: 125/92 138/53 155/66 155/65  Pulse: 58 63 62 63  Temp: 97.9 F (36.6 C) 97.8 F (36.6 C)  97.6 F (36.4 C)  TempSrc: Oral Oral  Oral  Resp: Height:  (1.6 m)     Weight: 69.219 kg (152 lb 9.6 oz) 68.539 kg (151 lb 1.6 oz)    SpO2: 100% 97% 100% 100%    Intake/Output Summary (Last 24 hours) at 10/31/15 1504 Last data filed at 10/31/15 1430  Gross per 24 hour  Intake    600 ml  Output      4 ml  Net    596 ml     Wt Readings from Last 3 Encounters:  10/31/15 68.539 kg (151 lb 1.6 oz)  07/29/15 78.926 kg (174 lb)  06/24/15 78.472 kg (173 lb)     Exam  General: Alert and oriented x 2, NAD  HEENT:  PERRLA, EOMI, Anicteric Sclera, mucous membranes moist.   Neck: Supple, no JVD, no masses  CVS: S1 S2 auscultated, no rubs, murmurs or gallops. Regular rate and rhythm.  Respiratory: Clear to auscultation bilaterally, no wheezing, rales or rhonchi  Abdomen: Soft, nontender, nondistended, + bowel sounds  Ext: no cyanosis clubbing or edema  Neuro: AAOx3, Cr N's II- XII. Strength 5/5 upper and lower extremities bilaterally  Skin: No rashes  Psych: Normal affect and demeanor, alert and oriented x2   Data Review   Micro Results No results found for this or any previous visit (from the past 240 hour(s)).  Radiology Reports Dg Chest 2 View  10/30/2015  CLINICAL DATA:  Syncopal episode and dizziness EXAM: CHEST  2 VIEW COMPARISON:  04/10/2015 FINDINGS: Cardiac shadow is within normal limits. Postsurgical changes are again seen in the cervical spine. The lungs are well aerated bilaterally. No focal infiltrate or sizable effusion is seen. No acute bony  abnormality is noted. IMPRESSION: No active cardiopulmonary disease. Electronically Signed   By: Alcide Clever M.D.   On: 10/30/2015 13:30   Ct Head Wo Contrast  10/30/2015  CLINICAL DATA:  Dizziness and slurred speech EXAM: CT HEAD WITHOUT CONTRAST TECHNIQUE: Contiguous axial images were obtained from the base of the skull  through the vertex without intravenous contrast. COMPARISON:  04/10/2015 FINDINGS: The bony calvarium is intact. No gross soft tissue abnormality is seen. Minimal atrophic changes are noted. No findings to suggest acute hemorrhage, acute infarction or space-occupying mass lesion are noted. IMPRESSION: Mild atrophic changes without acute abnormality. Electronically Signed   By: Alcide Clever M.D.   On: 10/30/2015 13:23    CBC  Recent Labs Lab 10/30/15 1216 10/30/15 2119 10/31/15 0520  WBC 7.3 6.4 4.4  HGB 14.3 13.5 12.3  HCT 43.3 40.9 36.6  PLT 168 118* 93*  MCV 84.6 84.3 85.7  MCH 27.9 27.8 28.8  MCHC 33.0 33.0 33.6  RDW 13.7 13.6 13.8    Chemistries   Recent Labs Lab 10/30/15 1216 10/30/15 2119 10/31/15 0520  NA 141  --  143  K 2.4*  --  4.7  CL 95*  --  105  CO2 27  --  25  GLUCOSE 126*  --  97  BUN 15  --  12  CREATININE 0.96 0.88 0.77  CALCIUM 9.8  --  9.1  AST 23  --  30  ALT 17  --  14  ALKPHOS 52  --  41  BILITOT 0.9  --  1.4*   ------------------------------------------------------------------------------------------------------------------ estimated creatinine clearance is 66.8 mL/min (by C-G formula based on Cr of 0.77). ------------------------------------------------------------------------------------------------------------------ No results for input(s): HGBA1C in the last 72 hours. ------------------------------------------------------------------------------------------------------------------ No results for input(s): CHOL, HDL, LDLCALC, TRIG, CHOLHDL, LDLDIRECT in the last 72  hours. ------------------------------------------------------------------------------------------------------------------ No results for input(s): TSH, T4TOTAL, T3FREE, THYROIDAB in the last 72 hours.  Invalid input(s): FREET3 ------------------------------------------------------------------------------------------------------------------ No results for input(s): VITAMINB12, FOLATE, FERRITIN, TIBC, IRON, RETICCTPCT in the last 72 hours.  Coagulation profile No results for input(s): INR, PROTIME in the last 168 hours.  No results for input(s): DDIMER in the last 72 hours.  Cardiac Enzymes  Recent Labs Lab 10/30/15 2119 10/31/15 0150 10/31/15 0728  TROPONINI 0.03 0.05* 0.03   ------------------------------------------------------------------------------------------------------------------ Invalid input(s): POCBNP   Recent Labs  10/30/15 1216  GLUCAP 118*     RAI,RIPUDEEP M.D. [facility] 10/31/2015, 3:04 PM  Pager: [phone] Between 7am to 7pm - call Pager - [phone]  After 7pm go to [email or web address] - password [email or web address]  Call night coverage person covering after 7pm

## 2015-10-31 NOTE — Consult Note (Addendum)
[facility] Face-to-Face Psychiatry Consult   Reason for Consult:  Depression and significant cognitive deficits Referring Physician:  Dr. 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 Orthostatic hypotension [I95.1] 10/30/2015  . Hypokalemia [E87.6] 10/30/2015  . UTI (lower urinary tract infection) [N39.0] 10/30/2015  . Elevated troponin [R79.89] 10/30/2015  . Metabolic encephalopathy [G93.41] 10/30/2015  . Mild dementia [F03.90] 07/29/2015  . Encephalopathy, metabolic [G93.41] 06/24/2015  . Facial twitching [G51.4] 06/24/2015  . Memory loss [R41.3] 05/09/2015  . Syncope [R55] 06/22/2013  . Neck pain on left side [M54.2] 06/22/2013  . Screening for malignant neoplasm of the cervix [Z12.4] 06/27/2012  . General medical examination [Z00.00] 02/10/2012  . GERD (gastroesophageal reflux disease) [K21.9]   . Essential hypertension [I10]   . Hyperlipidemia [E78.5]   . Depression [F32.9]   . DDD (degenerative disc disease), cervical [M50.30]     Total Time spent with patient: 1 hour  Subjective:   Tina Fitzpatrick is a 65 y.o. female patient admitted with altered mental status and urinary tract infection.  HPI:  Tina Fitzpatrick is an 65 y.o. female seen, chart reviewed, reviewed neurology evaluation for face-to-face psychiatric consultation and evaluation of recently increased cognitive deficits especially memory and slow thought process and depression. Patient admitted to [facility] with increased confusion, recent frequent falls at home and unable to remember what makes her fall down. Patient stated she came to the hospital because of nausea and possibly throwing up. Patient stated she has a lot of female related health problems but could not give the details. Patient is a poor historian continuously looking at her husband to provide  the answers. Patient stated that she does not have any task to do at home, just to hang around and asked questions to her husband when she does not know and most of the work was done by her husband. Patient does not work when asked why she is not working she said maybe it is too old and said she is not too old she is only 65 years old. Patient needed prompting to respond if does not remember. Reportedly patient has been taking Zoloft and Xanax probably from primary care physician which seems to be somewhat helping her depression and anxiety but not improving her cognition. Patient husband who is at bedside at the start of the meeting walk away when talked with the patient. Patient stated she is able to talk to me much easy then other people because I tried not to put her on spot when she was not able to recall the information. Patient knows her name, the husband and how long they've been matted together and her children. 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 She does not remember why she quit working. With her current mental status and cognitive deficits it is difficult to educate about her medical conditions and asking her to make any specific decisions so I would like to make her family to be medical care power of attorney. It is not clear how much utility tract infection or chronic progressive condition like dementia playing a role in her case because it has been short-lived for about 18 months and not clear this is progressive are not.  Past Psychiatric History: Major depressive disorder and anxiety but no history of acute  psychiatric hospitalizations.  Risk to Self: Is patient at risk for suicide?: No Risk to Others:   Prior Inpatient Therapy:   Prior Outpatient Therapy:    Past Medical History:  Past Medical History  Diagnosis Date  . GERD (gastroesophageal reflux disease)   . Hypertension   . Hyperlipidemia    . Depression   . DDD (degenerative disc disease), cervical   . Kidney stones     Past Surgical History  Procedure Laterality Date  . Neck surgery  2008    Plate/Screws  . Kidney stones  1988   Family History:  Family History  Problem Relation Age of Onset  . Heart attack Mother    Family Psychiatric  History: Not significant for family history of mental illness Social History:  History  Alcohol Use No     History  Drug Use No    Social History   Social History  . Marital Status: Married    Spouse Name: N/A  . Number of Children: 2  . Years of Education: N/A   Social History Main Topics  . Smoking status: Former Smoker    Types: Cigarettes  . Smokeless tobacco: Never Used  . Alcohol Use: No  . Drug Use: No  . Sexual Activity: Yes   Other Topics Concern  . None   Social History Narrative   Married with 2 children   Right handed    12 th grade    1 cup daily   Additional Social History:    Allergies:  No Known Allergies  Labs:  Results for orders placed or performed during the hospital encounter of 10/30/15 (from the past 48 hour(s))  Comprehensive metabolic panel     Status: Abnormal   Collection Time: 10/30/15 12:16 PM  Result Value Ref Range   Sodium 141 135 - 145 mmol/L   Potassium 2.4 (LL) 3.5 - 5.1 mmol/L    Comment: CRITICAL RESULT CALLED TO, READ BACK BY AND VERIFIED WITH: D HUGHES,RN 1259 10/30/15 D BRADLEY    Chloride 95 (L) 101 - 111 mmol/L   CO2 27 22 - 32 mmol/L   Glucose, Bld 126 (H) 65 - 99 mg/dL   BUN 15 6 - 20 mg/dL   Creatinine, Ser 0.96 0.44 - 1.00 mg/dL   Calcium 9.8 8.9 - 10.3 mg/dL   Total Protein 6.9 6.5 - 8.1 g/dL   Albumin 3.9 3.5 - 5.0 g/dL   AST 23 15 - 41 U/L   ALT 17 14 - 54 U/L   Alkaline Phosphatase 52 38 - 126 U/L   Total Bilirubin 0.9 0.3 - 1.2 mg/dL   GFR calc non Af Amer >60 >60 mL/min   GFR calc Af Amer >60 >60 mL/min    Comment: (NOTE) The eGFR has been calculated using the CKD EPI equation. This  calculation has not been validated in all clinical situations. eGFR's persistently <60 mL/min signify possible Chronic Kidney Disease.    Anion gap 19 (H) 5 - 15  CBC     Status: Abnormal   Collection Time: 10/30/15 12:16 PM  Result Value Ref Range   WBC 7.3 4.0 - 10.5 K/uL   RBC 5.12 (H) 3.87 - 5.11 MIL/uL   Hemoglobin 14.3 12.0 - 15.0 g/dL   HCT 43.3 36.0 - 46.0 %   MCV 84.6 78.0 - 100.0 fL   MCH 27.9 26.0 - 34.0 pg   MCHC 33.0 30.0 - 36.0 g/dL   RDW 13.7 11.5 - 15.5 %  Platelets 168 150 - 400 K/uL  CBG monitoring, ED     Status: Abnormal   Collection Time: 10/30/15 12:16 PM  Result Value Ref Range   Glucose-Capillary 118 (H) 65 - 99 mg/dL   Comment 1 Notify RN    Comment 2 Document in Chart   Troponin I     Status: Abnormal   Collection Time: 10/30/15 12:16 PM  Result Value Ref Range   Troponin I 0.05 (H) <0.031 ng/mL    Comment:        PERSISTENTLY INCREASED TROPONIN VALUES IN THE RANGE OF 0.04-0.49 ng/mL CAN BE SEEN IN:       -UNSTABLE ANGINA       -CONGESTIVE HEART FAILURE       -MYOCARDITIS       -CHEST TRAUMA       -ARRYHTHMIAS       -LATE PRESENTING MYOCARDIAL INFARCTION       -COPD   CLINICAL FOLLOW-UP RECOMMENDED.   Urinalysis, Routine w reflex microscopic (not at [facility])     Status: Abnormal   Collection Time: 10/30/15 12:59 PM  Result Value Ref Range   Color, Urine YELLOW YELLOW   APPearance CLOUDY (A) CLEAR   Specific Gravity, Urine 1.020 1.005 - 1.030   pH 6.0 5.0 - 8.0   Glucose, UA NEGATIVE NEGATIVE mg/dL   Hgb urine dipstick TRACE (A) NEGATIVE   Bilirubin Urine MODERATE (A) NEGATIVE   Ketones, ur 15 (A) NEGATIVE mg/dL   Protein, ur 30 (A) NEGATIVE mg/dL   Nitrite POSITIVE (A) NEGATIVE   Leukocytes, UA MODERATE (A) NEGATIVE  Urine rapid drug screen (hosp performed)     Status: Abnormal   Collection Time: 10/30/15 12:59 PM  Result Value Ref Range   Opiates NONE DETECTED NONE DETECTED   Cocaine NONE DETECTED NONE DETECTED   Benzodiazepines  POSITIVE (A) NONE DETECTED   Amphetamines NONE DETECTED NONE DETECTED   Tetrahydrocannabinol NONE DETECTED NONE DETECTED   Barbiturates NONE DETECTED NONE DETECTED    Comment:        DRUG SCREEN FOR MEDICAL PURPOSES ONLY.  IF CONFIRMATION IS NEEDED FOR ANY PURPOSE, NOTIFY LAB WITHIN 5 DAYS.        LOWEST DETECTABLE LIMITS FOR URINE DRUG SCREEN Drug Class       Cutoff (ng/mL) Amphetamine      1000 Barbiturate      200 Benzodiazepine   751 Tricyclics       025 Opiates          300 Cocaine          300 THC              50   Urine microscopic-add on     Status: Abnormal   Collection Time: 10/30/15 12:59 PM  Result Value Ref Range   Squamous Epithelial / LPF 0-5 (A) NONE SEEN   WBC, UA 6-30 0 - 5 WBC/hpf   RBC / HPF 0-5 0 - 5 RBC/hpf   Bacteria, UA MANY (A) NONE SEEN   Urine-Other LESS THAN 10 mL OF URINE SUBMITTED     Comment: MICROSCOPIC EXAM PERFORMED ON UNCONCENTRATED URINE  Ethanol     Status: None   Collection Time: 10/30/15  1:18 PM  Result Value Ref Range   Alcohol, Ethyl (B) <5 <5 mg/dL    Comment:        LOWEST DETECTABLE LIMIT FOR SERUM ALCOHOL IS 5 mg/dL FOR MEDICAL PURPOSES ONLY   Creatinine, serum  Status: None   Collection Time: 10/30/15  9:19 PM  Result Value Ref Range   Creatinine, Ser 0.88 0.44 - 1.00 mg/dL   GFR calc non Af Amer >60 >60 mL/min   GFR calc Af Amer >60 >60 mL/min    Comment: (NOTE) The eGFR has been calculated using the CKD EPI equation. This calculation has not been validated in all clinical situations. eGFR's persistently <60 mL/min signify possible Chronic Kidney Disease.   Troponin I     Status: None   Collection Time: 10/30/15  9:19 PM  Result Value Ref Range   Troponin I 0.03 <0.031 ng/mL    Comment:        NO INDICATION OF MYOCARDIAL INJURY.   CK     Status: None   Collection Time: 10/30/15  9:19 PM  Result Value Ref Range   Total CK 68 38 - 234 U/L  CBC     Status: Abnormal   Collection Time: 10/30/15  9:19 PM   Result Value Ref Range   WBC 6.4 4.0 - 10.5 K/uL   RBC 4.85 3.87 - 5.11 MIL/uL   Hemoglobin 13.5 12.0 - 15.0 g/dL   HCT 40.9 36.0 - 46.0 %   MCV 84.3 78.0 - 100.0 fL   MCH 27.8 26.0 - 34.0 pg   MCHC 33.0 30.0 - 36.0 g/dL   RDW 13.6 11.5 - 15.5 %   Platelets 118 (L) 150 - 400 K/uL    Comment: SPECIMEN CHECKED FOR CLOTS PLATELET COUNT CONFIRMED BY SMEAR   Troponin I     Status: Abnormal   Collection Time: 10/31/15  1:50 AM  Result Value Ref Range   Troponin I 0.05 (H) <0.031 ng/mL    Comment:        PERSISTENTLY INCREASED TROPONIN VALUES IN THE RANGE OF 0.04-0.49 ng/mL CAN BE SEEN IN:       -UNSTABLE ANGINA       -CONGESTIVE HEART FAILURE       -MYOCARDITIS       -CHEST TRAUMA       -ARRYHTHMIAS       -LATE PRESENTING MYOCARDIAL INFARCTION       -COPD   CLINICAL FOLLOW-UP RECOMMENDED.   Comprehensive metabolic panel     Status: Abnormal   Collection Time: 10/31/15  5:20 AM  Result Value Ref Range   Sodium 143 135 - 145 mmol/L   Potassium 4.7 3.5 - 5.1 mmol/L    Comment: DELTA CHECK NOTED SPECIMEN HEMOLYZED. HEMOLYSIS MAY AFFECT INTEGRITY OF RESULTS.    Chloride 105 101 - 111 mmol/L   CO2 25 22 - 32 mmol/L   Glucose, Bld 97 65 - 99 mg/dL   BUN 12 6 - 20 mg/dL   Creatinine, Ser 0.77 0.44 - 1.00 mg/dL   Calcium 9.1 8.9 - 10.3 mg/dL   Total Protein 5.6 (L) 6.5 - 8.1 g/dL   Albumin 3.2 (L) 3.5 - 5.0 g/dL   AST 30 15 - 41 U/L   ALT 14 14 - 54 U/L   Alkaline Phosphatase 41 38 - 126 U/L   Total Bilirubin 1.4 (H) 0.3 - 1.2 mg/dL   GFR calc non Af Amer >60 >60 mL/min   GFR calc Af Amer >60 >60 mL/min    Comment: (NOTE) The eGFR has been calculated using the CKD EPI equation. This calculation has not been validated in all clinical situations. eGFR's persistently <60 mL/min signify possible Chronic Kidney Disease.    Anion gap 13 5 -  15  CBC     Status: Abnormal   Collection Time: 10/31/15  5:20 AM  Result Value Ref Range   WBC 4.4 4.0 - 10.5 K/uL   RBC 4.27 3.87 -  5.11 MIL/uL   Hemoglobin 12.3 12.0 - 15.0 g/dL   HCT 36.6 36.0 - 46.0 %   MCV 85.7 78.0 - 100.0 fL   MCH 28.8 26.0 - 34.0 pg   MCHC 33.6 30.0 - 36.0 g/dL   RDW 13.8 11.5 - 15.5 %   Platelets 93 (L) 150 - 400 K/uL    Comment: SPECIMEN CHECKED FOR CLOTS REPEATED TO VERIFY CONSISTENT WITH PREVIOUS RESULT   Troponin I     Status: None   Collection Time: 10/31/15  7:28 AM  Result Value Ref Range   Troponin I 0.03 <0.031 ng/mL    Comment:        NO INDICATION OF MYOCARDIAL INJURY.     Current Facility-Administered Medications  Medication Dose Route Frequency Provider Last Rate Last Dose  . 0.9 %  sodium chloride infusion   Intravenous STAT Emily West, PA-C      . 0.9 %  sodium chloride infusion   Intravenous Continuous Charlynne Cousins, MD 125 mL/hr at 10/31/15 0111    . acetaminophen (TYLENOL) tablet 650 mg  650 mg Oral Q6H PRN Charlynne Cousins, MD       Or  . acetaminophen (TYLENOL) suppository 650 mg  650 mg Rectal Q6H PRN Charlynne Cousins, MD      . ALPRAZolam Duanne Moron) tablet 0.5 mg  0.5 mg Oral TID PRN Charlynne Cousins, MD      . aspirin EC tablet 81 mg  81 mg Oral Daily Charlynne Cousins, MD   81 mg at 10/31/15 1120  . atenolol (TENORMIN) tablet 50 mg  50 mg Oral Daily Charlynne Cousins, MD   50 mg at 10/31/15 1121  . cefTRIAXone (ROCEPHIN) 1 g in dextrose 5 % 50 mL IVPB  1 g Intravenous Q24H Roma Schanz, RPH      . fenofibrate tablet 160 mg  160 mg Oral Daily Charlynne Cousins, MD   160 mg at 10/31/15 1121  . heparin injection 5,000 Units  5,000 Units Subcutaneous 3 times per day Charlynne Cousins, MD   5,000 Units at 10/31/15 [phone]  . ondansetron (ZOFRAN) tablet 4 mg  4 mg Oral Q6H PRN Charlynne Cousins, MD       Or  . ondansetron [place]) injection 4 mg  4 mg Intravenous Q6H PRN Charlynne Cousins, MD      . pantoprazole (PROTONIX) EC tablet 40 mg  40 mg Oral Daily Charlynne Cousins, MD   40 mg at 10/31/15 1120  . polyethylene glycol (MIRALAX /  GLYCOLAX) packet 17 g  17 g Oral Daily PRN Charlynne Cousins, MD      . potassium chloride SA (K-DUR,KLOR-CON) CR tablet 40 mEq  40 mEq Oral TID Charlynne Cousins, MD   40 mEq at 10/31/15 1121  . sertraline (ZOLOFT) tablet 100 mg  100 mg Oral BID Charlynne Cousins, MD   100 mg at 10/31/15 1121  . simvastatin (ZOCOR) tablet 40 mg  40 mg Oral QPM Charlynne Cousins, MD   40 mg at 10/31/15 0009  . sodium chloride flush (NS) 0.9 % injection 3 mL  3 mL Intravenous Q12H Charlynne Cousins, MD   3 mL at 10/31/15 1000    Musculoskeletal: Strength &  Muscle Tone: within normal limits Gait & Station: unable to stand Patient leans: N/A  Psychiatric Specialty Exam: Review of Systems  Constitutional: Positive for malaise/fatigue.  Gastrointestinal: Positive for nausea and vomiting.  Genitourinary: Positive for dysuria and frequency.  Musculoskeletal: Positive for myalgias.  Neurological: Positive for weakness.  Psychiatric/Behavioral: Positive for depression and memory loss. The patient is nervous/anxious and has insomnia.     Blood pressure 155/65, pulse 63, temperature 97.6 F (36.4 C), temperature source Oral, resp. rate 18, height '5\' 3"'$  (1.6 m), weight 68.539 kg (151 lb 1.6 oz), SpO2 100 %.Body mass index is 26.77 kg/(m^2).  General Appearance: Casual  Eye Contact::  Good  Speech:  Clear and Coherent  Volume:  Normal  Mood:  Anxious and Depressed  Affect:  Appropriate and Congruent  Thought Process:  Coherent, Goal Directed and Tangential  Orientation:  Full (Time, Place, and Person)  Thought Content:  Rumination  Suicidal Thoughts:  No  Homicidal Thoughts:  No  Memory:  Immediate;   Fair Recent;   Poor Remote;   Good  Judgement:  Fair  Insight:  Shallow  Psychomotor Activity:  Decreased  Concentration:  Fair  Recall:  Poor  Fund of Knowledge:Fair  Language: Good  Akathisia:  Negative  Handed:  Right  AIMS (if indicated):     Assets:  Communication Skills Desire for  Improvement Financial Resources/Insurance Housing Intimacy Leisure Time Physical Health Resilience Transportation  ADL's:  Intact  Cognition: Impaired,  Mild  Sleep:      Treatment Plan Summary: Daily contact with patient to assess and evaluate symptoms and progress in treatment and Medication management  We will change his Zoloft 100 mg daily for 7 days, 50 mg another 7 days and then will discontinue as he does not show improvement We start Wellbutrin SR 150 mg daily morning for depression, anxiety and attention and memory Continue treatment for a urinary tract infection which will help her improve cognitions Patient may needed comprehensive evaluation for dementia and possibly dementia medication in near future if not improved with the above medication change  Disposition: Patient will be referred to the outpatient medication management and medically stable. Patient does not meet criteria for psychiatric inpatient admission. Supportive therapy provided about ongoing stressors.  Durward Parcel., MD 10/31/2015 12:03 PM

## 2015-10-31 NOTE — Consult Note (Signed)
NEURO HOSPITALIST CONSULT NOTE   Requestig physician: Dr. Isidoro Donning   Reason for Consult: confusion  HPI:                                                                                                                                          Tina Fitzpatrick is an 65 y.o. female past medical history memory disorder comes in for confusion and a fall that happened on the day of admission. As per husband S patient cannot give a history as she is confused he relates he came home and she had stooled on herself. She did not recognize him this morning. And when he got her up to go to the bathroom fell to the floor. He relates she felt clammy but was still confused.  She has been followed up by her primary care doctor for memory loss for the past 6 weeks.  Per Dr Karel Jarvis note "The patient was last seen a month ago for memory loss and confusion. MMSE 19/30. Records and images were personally reviewed where available. I personally reviewed MRI brain with and without contrast which did not show any acute changes, there was mild diffuse atrophy and mild chronic microvascular disease. Her wake and sleep EEG was normal. No change in memory problems, her husband expressed concern about medication affecting cognition, she states she takes Xanax 3-4 times a day and is unsure about the Trazodone at night. She does not drive.  She stopped driving 2 years ago because she got lost coming back from her sister's house in [place]. She has confusion "all the time," worsening on a daily basis. She reports that he gets upset with her and "hollers at me." The husband on the other hand, states that she talks and he has not idea what she is talking about, making her upset, then the situation worsens when he tries to help her."  Patietn was admitted and found to have a UTI  She has had a extensive work up by Dr Karel Jarvis: CSF:  VRDL , MBP , oligo clonal bands, cryptococcal, fungus, culture, lymphoma panel--All  negative  Beta Isoform pending  Past Medical History  Diagnosis Date  . GERD (gastroesophageal reflux disease)   . Hypertension   . Hyperlipidemia   . Depression   . DDD (degenerative disc disease), cervical   . Kidney stones     Past Surgical History  Procedure Laterality Date  . Neck surgery  2008    Plate/Screws  . Kidney stones  1988    Family History  Problem Relation Age of Onset  . Heart attack Mother      Social History:  reports that she has quit smoking. Her smoking use included Cigarettes. She has never used smokeless tobacco. She reports that she does not  drink alcohol or use illicit drugs.  No Known Allergies  MEDICATIONS:                                                                                                                     Prior to Admission:  Prescriptions prior to admission  Medication Sig Dispense Refill Last Dose  . ALPRAZolam (XANAX) 0.5 MG tablet TAKE ONE TABLET BY MOUTH 4 TIMES DAILY AS NEEDED FOR ANXIETY   10/29/2015 at Unknown time  . amitriptyline (ELAVIL) 10 MG tablet Take 20 mg by mouth at bedtime as needed for sleep.    10/29/2015 at Unknown time  . aspirin 81 MG tablet Take 81 mg by mouth daily.   10/29/2015 at Unknown time  . atenolol (TENORMIN) 50 MG tablet TAKE ONE TABLET BY MOUTH ONCE DAILY 30 tablet 6 10/29/2015 at 0800  . cyclobenzaprine (FLEXERIL) 10 MG tablet TAKE ONE TABLET BY MOUTH THREE TIMES DAILY AS NEEDED FOR MUSCLE SPASM 30 tablet 2 10/29/2015 at Unknown time  . fenofibrate 160 MG tablet TAKE ONE TABLET BY MOUTH ONCE DAILY 30 tablet 6 10/29/2015 at Unknown time  . lisinopril (PRINIVIL,ZESTRIL) 5 MG tablet TAKE ONE TABLET BY MOUTH ONCE DAILY 30 tablet 6 10/29/2015 at Unknown time  . naproxen (NAPROSYN) 500 MG tablet TAKE ONE TABLET BY MOUTH TWICE DAILY WITH MEALS 60 tablet 5 10/29/2015 at Unknown time  . omeprazole (PRILOSEC) 40 MG capsule TAKE ONE CAPSULE BY MOUTH ONCE DAILY (Patient taking differently: Take 40 mg by mouth  daily. ) 30 capsule 3 10/29/2015 at Unknown time  . promethazine (PHENERGAN) 12.5 MG tablet PT NEEDS OFFICE VISIT.  Take one tablet by mouth every 8 hours as needed for nausea. (Patient taking differently: Take 12.5 mg by mouth every 8 (eight) hours as needed for nausea or vomiting. ) 30 tablet 0 10/29/2015 at Unknown time  . sertraline (ZOLOFT) 100 MG tablet Take 100 mg by mouth 2 (two) times daily.   10/29/2015 at Unknown time  . simvastatin (ZOCOR) 40 MG tablet TAKE ONE TABLET BY MOUTH IN THE EVENING 90 tablet 1 10/29/2015 at Unknown time  . traMADol (ULTRAM) 50 MG tablet TAKE ONE TABLET BY MOUTH EVERY 6 HOURS AS NEEDED FOR PAIN 90 tablet 3 10/29/2015 at Unknown time  . traZODone (DESYREL) 50 MG tablet TAKE ONE-HALF TABLET BY MOUTH AT BEDTIME AS NEEDED FOR SLEEP 30 tablet 0 10/29/2015 at Unknown time   Scheduled: . sodium chloride   Intravenous STAT  . aspirin EC  81 mg Oral Daily  . atenolol  50 mg Oral Daily  . cefTRIAXone (ROCEPHIN)  IV  1 g Intravenous Q24H  . citalopram  10 mg Oral Daily  . fenofibrate  160 mg Oral Daily  . heparin  5,000 Units Subcutaneous 3 times per day  . pantoprazole  40 mg Oral Daily  . potassium chloride  40 mEq Oral TID  . sertraline  100 mg Oral BID  . simvastatin  40 mg Oral QPM  . sodium  chloride flush  3 mL Intravenous Q12H     ROS:                                                                                                                                       History obtained from unobtainable from patient due to mental status    Blood pressure 138/53, pulse 63, temperature 97.8 F (36.6 C), temperature source Oral, resp. rate 18, height  (1.6 m), weight 68.539 kg (151 lb 1.6 oz), SpO2 97 %.   Neurologic Examination:                                                                                                      HEENT-  Normocephalic, no lesions, without obvious abnormality.  Normal external eye and conjunctiva.  Normal TM's bilaterally.   Normal auditory canals and external ears. Normal external nose, mucus membranes and septum.  Normal pharynx. Cardiovascular- S1, S2 normal, pulses palpable throughout   Lungs- chest clear, no wheezing, rales, normal symmetric air entry Abdomen- normal findings: bowel sounds normal Extremities- no edema Lymph-no adenopathy palpable Musculoskeletal-no joint tenderness, deformity or swelling Skin-warm and dry, no hyperpigmentation, vitiligo, or suspicious lesions  Neurological Examination Mental Status: Alert, oriented to hospital only.  Speech fluent without evidence of aphasia.  Able to follow 3 step commands without difficulty. Cranial Nerves: II:  Visual fields grossly normal, pupils equal, round, reactive to light and accommodation III,IV, VI: ptosis not present, extra-ocular motions intact bilaterally V,VII: smile symmetric, facial light touch sensation normal bilaterally VIII: hearing normal bilaterally IX,X: uvula rises symmetrically XI: bilateral shoulder shrug XII: midline tongue extension Motor: Right : Upper extremity   5/5    Left:     Upper extremity   5/5  Lower extremity   5/5     Lower extremity   5/5 Tone and bulk:normal tone throughout; no atrophy noted Sensory: Pinprick and light touch intact throughout, bilaterally Deep Tendon Reflexes: 1+ and symmetric throughout Plantars: Right: downgoing   Left: downgoing Cerebellar: normal finger-to-nose,  and normal heel-to-shin test       Lab Results: Basic Metabolic Panel:  Recent Labs Lab 10/30/15 1216 10/30/15 2119 10/31/15 0520  NA 141  --  143  K 2.4*  --  4.7  CL 95*  --  105  CO2 27  --  25  GLUCOSE 126*  --  97  BUN 15  --  12  CREATININE 0.96 0.88 0.77  CALCIUM 9.8  --  9.1    Liver Function Tests:  Recent Labs Lab 10/30/15 1216 10/31/15 0520  AST 23 30  ALT 17 14  ALKPHOS 52 41  BILITOT 0.9 1.4*  PROT 6.9 5.6*  ALBUMIN 3.9 3.2*   No results for input(s): LIPASE, AMYLASE in the last  168 hours. No results for input(s): AMMONIA in the last 168 hours.  CBC:  Recent Labs Lab 10/30/15 1216 10/30/15 2119 10/31/15 0520  WBC 7.3 6.4 4.4  HGB 14.3 13.5 12.3  HCT 43.3 40.9 36.6  MCV 84.6 84.3 85.7  PLT 168 118* 93*    Cardiac Enzymes:  Recent Labs Lab 10/30/15 1216 10/30/15 2119 10/31/15 0150 10/31/15 0728  CKTOTAL  --  68  --   --   TROPONINI 0.05* 0.03 0.05* 0.03    Lipid Panel: No results for input(s): CHOL, TRIG, HDL, CHOLHDL, VLDL, LDLCALC in the last 168 hours.  CBG:  Recent Labs Lab 10/30/15 1216  GLUCAP 118*    Microbiology: Results for orders placed or performed in visit on 08/06/15  CSF culture     Status: None   Collection Time: 08/06/15 11:12 AM  Result Value Ref Range Status   Gram Stain No WBC Seen  Final   Gram Stain No Organisms Seen  Final   Organism ID, Bacteria NO GROWTH 3 DAYS  Final  Fungus Culture with Smear     Status: None   Collection Time: 08/06/15 11:12 AM  Result Value Ref Range Status   Organism ID, Bacteria No Fungi Isolated in 4 Weeks    Final   Smear Result No Yeast or Fungal Elements Seen  Final    Coagulation Studies: No results for input(s): LABPROT, INR in the last 72 hours.  Imaging: Dg Chest 2 View  10/30/2015  CLINICAL DATA:  Syncopal episode and dizziness EXAM: CHEST  2 VIEW COMPARISON:  04/10/2015 FINDINGS: Cardiac shadow is within normal limits. Postsurgical changes are again seen in the cervical spine. The lungs are well aerated bilaterally. No focal infiltrate or sizable effusion is seen. No acute bony abnormality is noted. IMPRESSION: No active cardiopulmonary disease. Electronically Signed   By: Alcide Clever M.D.   On: 10/30/2015 13:30   Ct Head Wo Contrast  10/30/2015  CLINICAL DATA:  Dizziness and slurred speech EXAM: CT HEAD WITHOUT CONTRAST TECHNIQUE: Contiguous axial images were obtained from the base of the skull through the vertex without intravenous contrast. COMPARISON:  04/10/2015  FINDINGS: The bony calvarium is intact. No gross soft tissue abnormality is seen. Minimal atrophic changes are noted. No findings to suggest acute hemorrhage, acute infarction or space-occupying mass lesion are noted. IMPRESSION: Mild atrophic changes without acute abnormality. Electronically Signed   By: Alcide Clever M.D.   On: 10/30/2015 13:23       Assessment and plan per attending neurologist  Felicie Morn PA-C [facility] [phone]  10/31/2015, 9:59 AM   Assessment/Plan: Worsening confusion in patient with suspected underlying neurodegenerative decline in the setting of UTI.  Patient has had a full work up by Dr. Karel Jarvis which does not need to be repeated in hospital. At this time recommend treating UTI and follow up with Dr. Karel Jarvis.  Husband is allowing wife to make all decision , however I do not believe she has the cognitive ability to be making such decisions. Recommend having psych do evaluation for ability to make independent decisions.

## 2015-11-01 LAB — HEMOGLOBIN A1C
Hgb A1c MFr Bld: 5.3 % (ref 4.8–5.6)
Mean Plasma Glucose: 105 mg/dL

## 2015-11-01 LAB — RPR: RPR Ser Ql: NONREACTIVE

## 2015-11-01 MED ORDER — FOLIC ACID 1 MG PO TABS
1.0000 mg | ORAL_TABLET | Freq: Every day | ORAL | Status: DC
  Administered 2015-11-01 – 2015-11-02 (×2): 1 mg via ORAL
  Filled 2015-11-01 (×4): qty 1

## 2015-11-01 NOTE — Care Management Important Message (Signed)
Important Message  Patient Details  Name: Tina Fitzpatrick MRN: [id number] Date of Birth: 1950/11/08   Medicare Important Message Given:  Yes    Ikey Omary P [name] 11/01/2015, 11:47 AM

## 2015-11-01 NOTE — Progress Notes (Signed)
[facility]                                                                              Patient Demographics  Tina Fitzpatrick, is a 65 y.o. female, DOB - Jul 10, 1951, OZH:[id number]  Admit date - 10/30/2015   Admitting Physician Marinda Elk, MD  Outpatient Primary MD for the patient is Neena Rhymes, MD  LOS - 2   Chief Complaint  Patient presents with  . Loss of Consciousness  . Altered Mental Status       Brief HPI   Patient is a 65 year old female with GERD, hypertension, hyperlipidemia, depression presented to ED for confusion and fall. Patient was not able to provide much history, husband reported that patient had the feces on herself and did not recognize him on the morning of admission. Then he got her up to go to the bathroom, she fell to the floor, was clammy and confused. Patient has been followed up by her primary care physician and neurology for the memory loss for the past 6 weeks. ED workup showed unremarkable BMET, CBC with platelets 90 3K otherwise hemoglobin 12.3 UA positive for UTI, CT head negative for acute abnormalities Assessment & Plan    Acute on chronic Encephalopathy, likely worsened due to due to UTI (lower urinary tract infection): With underlying history of mild dementia - Continue IV Rocephin, follow urine culture and sensitivity - Patient has chronic cognitive deficits which has been extensively worked up by outpatient neurology, Dr. Karel Jarvis Including MRI of the brain negative for infarct except mild diffuse atrophy, EEG normal. CSF panel negative. She was recommended neuropsychological evaluation. Neurology and psychiatry consulted. - Folate low otherwise TSH, RPR nonreactive, B12 normal - Started on folic acid 1 mg daily  Essential hypertension/Orthostatic hypotension: - DC Elavil - Continue atenolol  Elevated troponins: She denies any chest pain or dyspnea, had mild elevation of troponin 0.05.  Her EKG just showed a  T-wave inversion in lead 2 and aVF (which was present back in 04/03/2015), unchanged with previous EKG. Cardiac enzymes with troponins trending down  Newly Depression - Psychiatry consulted, recommended decreasing Zoloft and wean it off - Started on Wellbutrin - Outpatient follow-up   Hypokalemia Likely due to hypovolemia replete orally recheck a basic metabolic pounding morning.   Code Status: Full code  Family Communication: Discussed in detail with the patient, all imaging results, lab results explained to the patient and husband at the bedside   Disposition Plan: DC home in a.m., pending urine cultures  Time Spent in minutes   25 minutes  Procedures  CT head  Consults   Neuro psych  DVT Prophylaxis   heparin  Medications  Scheduled Meds: . aspirin EC  81 mg Oral Daily  . atenolol  50 mg Oral Daily  . buPROPion  150 mg Oral Daily  . cefTRIAXone (ROCEPHIN)  IV  1 g Intravenous Q24H  . fenofibrate  160 mg Oral Daily  . folic acid  1 mg Oral Daily  . heparin  5,000 Units Subcutaneous 3 times per day  . pantoprazole  40 mg Oral Daily  . sertraline  100 mg  Oral Daily  . simvastatin  40 mg Oral QPM  . sodium chloride flush  3 mL Intravenous Q12H   Continuous Infusions:   PRN Meds:.acetaminophen **OR** acetaminophen, ALPRAZolam, ondansetron **OR** ondansetron (ZOFRAN) IV, polyethylene glycol   Antibiotics   Anti-infectives    Start     Dose/Rate Route Frequency Ordered Stop   10/31/15 1430  cefTRIAXone (ROCEPHIN) 1 g in dextrose 5 % 50 mL IVPB     1 g 100 mL/hr over 30 Minutes Intravenous Every 24 hours 10/30/15 2053     10/30/15 2000  cefTRIAXone (ROCEPHIN) 1 g in dextrose 5 % 50 mL IVPB  Status:  Discontinued     1 g 100 mL/hr over 30 Minutes Intravenous Every 24 hours 10/30/15 1957 10/30/15 2053   10/30/15 1400  cefTRIAXone (ROCEPHIN) 1 g in dextrose 5 % 50 mL IVPB     1 g 100 mL/hr over 30 Minutes Intravenous  Once 10/30/15 1349 10/30/15 1726         Subjective:   Tina Fitzpatrick was seen and examined today.  Alert and oriented to self and person. Husband at the bedside, no acute complaints. Patient denies dizziness, chest pain, shortness of breath, abdominal pain, N/V/D/C, new weakness, numbess, tingling. No acute events overnight.    Objective:   Filed Vitals:   11/01/15 0623 11/01/15 0900 11/01/15 0922 11/01/15 1137  BP: 144/62 152/85 152/85 153/88  Pulse: 71 76 76 68  Temp: 98 F (36.7 C) 98.5 F (36.9 C)  98.3 F (36.8 C)  TempSrc: Oral Oral  Oral  Resp: Height:      Weight: 68.357 kg (150 lb 11.2 oz)     SpO2: 96% 99%  98%    Intake/Output Summary (Last 24 hours) at 11/01/15 1329 Last data filed at 11/01/15 0957  Gross per 24 hour  Intake    483 ml  Output      1 ml  Net    482 ml     Wt Readings from Last 3 Encounters:  11/01/15 68.357 kg (150 lb 11.2 oz)  07/29/15 78.926 kg (174 lb)  06/24/15 78.472 kg (173 lb)     Exam  General: Alert and oriented x 2, NAD  HEENT:  PERRLA, EOMI, Anicteric Sclera, mucous membranes moist.   Neck: Supple, no JVD, no masses  CVS: S1 S2 auscultated, no rubs, murmurs or gallops. Regular rate and rhythm.  Respiratory: CTAB, no wheezing or rhonchi  Abdomen: Soft, nontender, nondistended, + bowel sounds  Ext: no cyanosis clubbing or edema  Neuro: no new deficits  Skin: No rashes  Psych: Normal affect and demeanor, alert and oriented x2   Data Review   Micro Results No results found for this or any previous visit (from the past 240 hour(s)).  Radiology Reports Dg Chest 2 View  10/30/2015  CLINICAL DATA:  Syncopal episode and dizziness EXAM: CHEST  2 VIEW COMPARISON:  04/10/2015 FINDINGS: Cardiac shadow is within normal limits. Postsurgical changes are again seen in the cervical spine. The lungs are well aerated bilaterally. No focal infiltrate or sizable effusion is seen. No acute bony abnormality is noted. IMPRESSION: No active cardiopulmonary  disease. Electronically Signed   By: Alcide Clever M.D.   On: 10/30/2015 13:30   Ct Head Wo Contrast  10/30/2015  CLINICAL DATA:  Dizziness and slurred speech EXAM: CT HEAD WITHOUT CONTRAST TECHNIQUE: Contiguous axial images were obtained from the base of the skull through the vertex without intravenous contrast.  COMPARISON:  04/10/2015 FINDINGS: The bony calvarium is intact. No gross soft tissue abnormality is seen. Minimal atrophic changes are noted. No findings to suggest acute hemorrhage, acute infarction or space-occupying mass lesion are noted. IMPRESSION: Mild atrophic changes without acute abnormality. Electronically Signed   By: Alcide Clever M.D.   On: 10/30/2015 13:23    CBC  Recent Labs Lab 10/30/15 1216 10/30/15 2119 10/31/15 0520  WBC 7.3 6.4 4.4  HGB 14.3 13.5 12.3  HCT 43.3 40.9 36.6  PLT 168 118* 93*  MCV 84.6 84.3 85.7  MCH 27.9 27.8 28.8  MCHC 33.0 33.0 33.6  RDW 13.7 13.6 13.8    Chemistries   Recent Labs Lab 10/30/15 1216 10/30/15 2119 10/31/15 0520  NA 141  --  143  K 2.4*  --  4.7  CL 95*  --  105  CO2 27  --  25  GLUCOSE 126*  --  97  BUN 15  --  12  CREATININE 0.96 0.88 0.77  CALCIUM 9.8  --  9.1  AST 23  --  30  ALT 17  --  14  ALKPHOS 52  --  41  BILITOT 0.9  --  1.4*   ------------------------------------------------------------------------------------------------------------------ estimated creatinine clearance is 66.8 mL/min (by C-G formula based on Cr of 0.77). ------------------------------------------------------------------------------------------------------------------  Recent Labs  10/30/15 2119  HGBA1C 5.3   ------------------------------------------------------------------------------------------------------------------ No results for input(s): CHOL, HDL, LDLCALC, TRIG, CHOLHDL, LDLDIRECT in the last 72  hours. ------------------------------------------------------------------------------------------------------------------  Recent Labs  10/31/15 1552  TSH 4.351   ------------------------------------------------------------------------------------------------------------------  Recent Labs  10/31/15 1542  VITAMINB12 380  FOLATE 5.2*    Coagulation profile No results for input(s): INR, PROTIME in the last 168 hours.  No results for input(s): DDIMER in the last 72 hours.  Cardiac Enzymes  Recent Labs Lab 10/30/15 2119 10/31/15 0150 10/31/15 0728  TROPONINI 0.03 0.05* 0.03   ------------------------------------------------------------------------------------------------------------------ Invalid input(s): POCBNP   Recent Labs  10/30/15 1216  GLUCAP 118*     RAI,RIPUDEEP M.D. [facility] 11/01/2015, 1:29 PM  Pager: [phone] Between 7am to 7pm - call Pager - [phone]  After 7pm go to [email or web address] - password [email or web address]  Call night coverage person covering after 7pm

## 2015-11-02 MED ORDER — CEPHALEXIN 500 MG PO CAPS
500.0000 mg | ORAL_CAPSULE | Freq: Two times a day (BID) | ORAL | Status: DC
  Administered 2015-11-02: 500 mg via ORAL
  Filled 2015-11-02: qty 1

## 2015-11-02 MED ORDER — FOLIC ACID 1 MG PO TABS: 1.0000 mg | ORAL_TABLET | Freq: Every day | ORAL | Status: DC

## 2015-11-02 MED ORDER — CEPHALEXIN 500 MG PO CAPS: 500.0000 mg | ORAL_CAPSULE | Freq: Two times a day (BID) | ORAL | Status: DC

## 2015-11-02 MED ORDER — BUPROPION HCL ER (SR) 150 MG PO TB12: 150.0000 mg | ORAL_TABLET | Freq: Every day | ORAL | Status: DC

## 2015-11-02 MED ORDER — SERTRALINE HCL 100 MG PO TABS: 100.0000 mg | ORAL_TABLET | Freq: Every day | ORAL | Status: DC

## 2015-11-02 NOTE — Discharge Summary (Signed)
Physician Discharge Summary   Patient ID: Tina Fitzpatrick MRN: [id number] DOB/AGE: 05-10-1951 65 y.o.  Admit date: 10/30/2015 Discharge date: 11/02/2015  Primary Care Physician:  Neena Rhymes, MD  Discharge Diagnoses:    . Encephalopathy, metabolic likely early onset dementia  . UTI  . Essential hypertension . Hypokalemia . Depression Folate deficiency    Consults: Neurology Psychiatry  Recommendations for Outpatient Follow-up:  1. Patient started on Wellbutrin per psychiatry recommendations, 2. Psychiatry recommended to taper off Zoloft, stop Elavil 3. Please repeat CBC/BMET at next visit   DIET: Heart healthy diet   Allergies:  No Known Allergies   DISCHARGE MEDICATIONS: Current Discharge Medication List    START taking these medications   Details  buPROPion (WELLBUTRIN SR) 150 MG 12 hr tablet Take 1 tablet (150 mg total) by mouth daily. Qty: 30 tablet, Refills: 1    cephALEXin (KEFLEX) 500 MG capsule Take 1 capsule (500 mg total) by mouth 2 (two) times daily. X 3days Qty: 6 capsule, Refills: 0    folic acid (FOLVITE) 1 MG tablet Take 1 tablet (1 mg total) by mouth daily. Qty: 30 tablet, Refills: 4      CONTINUE these medications which have CHANGED   Details  sertraline (ZOLOFT) 100 MG tablet Take 1 tablet (100 mg total) by mouth daily. Please take  (1tab) daily for next 5 days, then taper to  (1/2 tab) for 2 weeks, then OFF      CONTINUE these medications which have NOT CHANGED   Details  ALPRAZolam (XANAX) 0.5 MG tablet TAKE ONE TABLET BY MOUTH 4 TIMES DAILY AS NEEDED FOR ANXIETY    aspirin 81 MG tablet Take 81 mg by mouth daily.    atenolol (TENORMIN) 50 MG tablet TAKE ONE TABLET BY MOUTH ONCE DAILY Qty: 30 tablet, Refills: 6    fenofibrate 160 MG tablet TAKE ONE TABLET BY MOUTH ONCE DAILY Qty: 30 tablet, Refills: 6    lisinopril (PRINIVIL,ZESTRIL) 5 MG tablet TAKE ONE TABLET BY MOUTH ONCE DAILY Qty: 30 tablet, Refills: 6     omeprazole (PRILOSEC) 40 MG capsule TAKE ONE CAPSULE BY MOUTH ONCE DAILY Qty: 30 capsule, Refills: 3    simvastatin (ZOCOR) 40 MG tablet TAKE ONE TABLET BY MOUTH IN THE EVENING Qty: 90 tablet, Refills: 1      STOP taking these medications     amitriptyline (ELAVIL) 10 MG tablet      cyclobenzaprine (FLEXERIL) 10 MG tablet      naproxen (NAPROSYN) 500 MG tablet      promethazine (PHENERGAN) 12.5 MG tablet      traMADol (ULTRAM) 50 MG tablet      traZODone (DESYREL) 50 MG tablet          Brief H and P: For complete details please refer to admission H and P, but in brief Patient is a 65 year old female with GERD, hypertension, hyperlipidemia, depression presented to ED for confusion and fall. Patient was not able to provide much history, husband reported that patient had the feces on herself and did not recognize him on the morning of admission. Then he got her up to go to the bathroom, she fell to the floor, was clammy and confused. Patient has been followed up by her primary care physician and neurology for the memory loss for the past 6 weeks. ED workup showed unremarkable BMET, CBC with platelets 90 3K otherwise hemoglobin 12.3 UA positive for UTI, CT head negative for acute abnormalities  Hospital Course:   Acute  on chronic Encephalopathy, likely worsened due to due to UTI (lower urinary tract infection): With underlying history of mild dementia - Patient was placed on IV Rocephin, urine culture did not show any acute growth. Patient was already transitioned to oral antibiotics. Her mental status did improve from the time of admission. - Patient has chronic cognitive deficits which has been extensively worked up by outpatient neurology, Dr. Karel Jarvis Including MRI of the brain negative for infarct except mild diffuse atrophy, EEG normal. CSF panel negative. She was recommended neuropsychological evaluation. Neurology and psychiatry was consulted. Neurology recommended to follow up  with outpatient neurology, Dr. Karel Jarvis, she has a scheduled appointment early next month. I also recommend to follow up with outpatient psychiatry. - Folate low otherwise TSH, RPR nonreactive, B12 normal - Started on folic acid 1 mg daily  Essential hypertension/Orthostatic hypotension: - DC Elavil - Continue atenolol  Elevated troponins: She denies any chest pain or dyspnea, had mild elevation of troponin 0.05.  Her EKG just showed a T-wave inversion in lead 2 and aVF (which was present back in 04/03/2015), unchanged with previous EKG. Cardiac enzymes with troponins trending down  Newly Depression - Psychiatry consulted, recommended decreasing Zoloft and wean it off - Started on Wellbutrin - Outpatient follow-up   Folate deficiency Continue folic acid 1 mg daily     Day of Discharge BP 134/86 mmHg  Pulse 85  Temp(Src) 98 F (36.7 C) (Oral)  Resp 18  Ht 5\' 3"  (1.6 m)  Wt 66.769 kg (147 lb 3.2 oz)  BMI 26.08 kg/m2  SpO2 98%  Physical Exam: General: Alert and awake oriented x3 not in any acute distress, intermittently forgetful. HEENT: anicteric sclera, pupils reactive to light and accommodation CVS: S1-S2 clear no murmur rubs or gallops Chest: clear to auscultation bilaterally, no wheezing rales or rhonchi Abdomen: soft nontender, nondistended, normal bowel sounds Extremities: no cyanosis, clubbing or edema noted bilaterally Neuro: Cranial nerves II-XII intact, no focal neurological deficits   The results of significant diagnostics from this hospitalization (including imaging, microbiology, ancillary and laboratory) are listed below for reference.    LAB RESULTS: Basic Metabolic Panel:  Recent Labs Lab 10/30/15 1216 10/30/15 2119 10/31/15 0520  NA 141  --  143  K 2.4*  --  4.7  CL 95*  --  105  CO2 27  --  25  GLUCOSE 126*  --  97  BUN 15  --  12  CREATININE 0.96 0.88 0.77  CALCIUM 9.8  --  9.1   Liver Function Tests:  Recent Labs Lab 10/30/15 1216  10/31/15 0520  AST 23 30  ALT 17 14  ALKPHOS 52 41  BILITOT 0.9 1.4*  PROT 6.9 5.6*  ALBUMIN 3.9 3.2*   No results for input(s): LIPASE, AMYLASE in the last 168 hours.  Recent Labs Lab 10/31/15 1542  AMMONIA 25   CBC:  Recent Labs Lab 10/30/15 2119 10/31/15 0520  WBC 6.4 4.4  HGB 13.5 12.3  HCT 40.9 36.6  MCV 84.3 85.7  PLT 118* 93*   Cardiac Enzymes:  Recent Labs Lab 10/30/15 2119 10/31/15 0150 10/31/15 0728  CKTOTAL 68  --   --   TROPONINI 0.03 0.05* 0.03   BNP: Invalid input(s): POCBNP CBG:  Recent Labs Lab 10/30/15 1216  GLUCAP 118*    Significant Diagnostic Studies:  Dg Chest 2 View  10/30/2015  CLINICAL DATA:  Syncopal episode and dizziness EXAM: CHEST  2 VIEW COMPARISON:  04/10/2015 FINDINGS: Cardiac shadow is within normal  limits. Postsurgical changes are again seen in the cervical spine. The lungs are well aerated bilaterally. No focal infiltrate or sizable effusion is seen. No acute bony abnormality is noted. IMPRESSION: No active cardiopulmonary disease. Electronically Signed   By: Alcide Clever M.D.   On: 10/30/2015 13:30   Ct Head Wo Contrast  10/30/2015  CLINICAL DATA:  Dizziness and slurred speech EXAM: CT HEAD WITHOUT CONTRAST TECHNIQUE: Contiguous axial images were obtained from the base of the skull through the vertex without intravenous contrast. COMPARISON:  04/10/2015 FINDINGS: The bony calvarium is intact. No gross soft tissue abnormality is seen. Minimal atrophic changes are noted. No findings to suggest acute hemorrhage, acute infarction or space-occupying mass lesion are noted. IMPRESSION: Mild atrophic changes without acute abnormality. Electronically Signed   By: Alcide Clever M.D.   On: 10/30/2015 13:23    2D ECHO:   Disposition and Follow-up: Discharge Instructions    Diet - low sodium heart healthy    Complete by:  As directed      Discharge instructions    Complete by:  As directed   Please take Zoloft  (1tab) daily for  next 5 days, then taper to  (1/2 tab) for 2 weeks, then OFF  Stop Elavil, trazodone     Increase activity slowly    Complete by:  As directed             DISPOSITION: Home   DISCHARGE FOLLOW-UP Follow-up Information    Follow up with Neena Rhymes, MD. Schedule an appointment as soon as possible for a visit in 10 days.   Specialty:  Family Medicine   Why:  for hospital follow-up   Contact information:   [address] [place] [address] [phone]       Follow up with Van Clines, MD On 11/18/2015.   Specialty:  Neurology   Why:  at 8:30AM    Contact information:   [address] [place] [address] [phone]       Follow up with Nehemiah Settle., MD. Schedule an appointment as soon as possible for a visit in 2 weeks.   Specialty:  Psychiatry   Why:  for hospital follow-up   Contact information:   [address] [place] [address] [phone]        Time spent on Discharge: 25 minutes  Signed:   Tiffanni Scarfo M.D. [facility] 11/02/2015, 11:46 AM Pager: [phone]

## 2015-11-02 NOTE — Progress Notes (Signed)
Discharge instruction and prescription given to pt and spouse . Verblized understanding Wheeled to lobby by NT

## 2015-11-03 LAB — URINE CULTURE: Culture: NO GROWTH

## 2015-11-04 ENCOUNTER — Telehealth: Payer: Self-pay | Admitting: Behavioral Health

## 2015-11-04 NOTE — Telephone Encounter (Signed)
Transition Care Management Follow-up Telephone Call  Admit date: 10/30/2015 Discharge date: 11/02/2015  Primary Care Physician: Neena Rhymes, MD  Discharge Diagnoses:   . Encephalopathy, metabolic likely early onset dementia  . UTI  . Essential hypertension . Hypokalemia . Depression Folate deficiency    Consults: Neurology Psychiatry  Recommendations for Outpatient Follow-up:  1. Patient started on Wellbutrin per psychiatry recommendations, 2. Psychiatry recommended to taper off Zoloft, stop Elavil 3. Please repeat CBC/BMET at next visit   How have you been since you were released from the hospital? Patient's spouse stated, " She's doing pretty good, resting at the moment".   Do you understand why you were in the hospital? yes, per the patient's spouse.   Do you understand the discharge instructions? yes, per the patient's spouse.   Where were you discharged to? Home with husband.   Items Reviewed:  Medications reviewed: yes  Allergies reviewed: yes, NKA  Dietary changes reviewed: yes, heart healthy diet and increase fluid intake.  Referrals reviewed: yes, follow-up with PCP and Dr. Karel Jarvis, neurologist.   Functional Questionnaire:   Activities of Daily Living (ADLs):   She states they are independent in the following: ambulation, bathing and hygiene, feeding, continence, grooming, toileting and dressing States they require assistance with the following: None    Any transportation issues/concerns?: no   Any patient concerns? No, the patient's spouse voiced that he would just like to see his wife get better.   Confirmed importance and date/time of follow-up visits scheduled yes, 11/11/15 at 11:00 AM.  Provider Appointment booked with Dr. Beverely Low.  Confirmed with patient if condition begins to worsen call PCP or go to the ER.  Patient was given the office number and encouraged to call back with question or concerns.  : yes

## 2015-11-11 ENCOUNTER — Ambulatory Visit (INDEPENDENT_AMBULATORY_CARE_PROVIDER_SITE_OTHER): Payer: Medicare Other | Admitting: Family Medicine

## 2015-11-11 ENCOUNTER — Encounter: Payer: Self-pay | Admitting: General Practice

## 2015-11-11 ENCOUNTER — Encounter: Payer: Self-pay | Admitting: Family Medicine

## 2015-11-11 VITALS — BP 122/86 | HR 76 | Temp 98.3°F | Resp 16 | Ht 63.0 in | Wt 159.4 lb

## 2015-11-11 DIAGNOSIS — G9341 Metabolic encephalopathy: Secondary | ICD-10-CM

## 2015-11-11 LAB — HEPATIC FUNCTION PANEL
ALBUMIN: 3.9 g/dL (ref 3.5–5.2)
ALK PHOS: 43 U/L (ref 39–117)
ALT: 10 U/L (ref 0–35)
AST: 18 U/L (ref 0–37)
Bilirubin, Direct: 0.1 mg/dL (ref 0.0–0.3)
TOTAL PROTEIN: 6.8 g/dL (ref 6.0–8.3)
Total Bilirubin: 0.4 mg/dL (ref 0.2–1.2)

## 2015-11-11 LAB — BASIC METABOLIC PANEL
BUN: 5 mg/dL — ABNORMAL LOW (ref 6–23)
CO2: 27 meq/L (ref 19–32)
Calcium: 9.6 mg/dL (ref 8.4–10.5)
Chloride: 106 mEq/L (ref 96–112)
Creatinine, Ser: 0.7 mg/dL (ref 0.40–1.20)
GFR: 89.72 mL/min (ref >60.00)
GLUCOSE: 81 mg/dL (ref 70–99)
POTASSIUM: 3.9 meq/L (ref 3.5–5.1)
SODIUM: 140 meq/L (ref 135–145)

## 2015-11-11 LAB — CBC WITH DIFFERENTIAL/PLATELET
BASOS ABS: 0.1 10*3/uL (ref 0.0–0.1)
Basophils Relative: 1.1 % (ref 0.0–3.0)
EOS PCT: 1.8 % (ref 0.0–5.0)
Eosinophils Absolute: 0.1 10*3/uL (ref 0.0–0.7)
HEMATOCRIT: 36.5 % (ref 36.0–46.0)
Hemoglobin: 12.3 g/dL (ref 12.0–15.0)
LYMPHS ABS: 1.7 10*3/uL (ref 0.7–4.0)
LYMPHS PCT: 28.2 % (ref 12.0–46.0)
MCHC: 33.6 g/dL (ref 30.0–36.0)
MCV: 83.9 fl (ref 78.0–100.0)
MONOS PCT: 4.1 % (ref 3.0–12.0)
Monocytes Absolute: 0.3 10*3/uL (ref 0.1–1.0)
NEUTROS ABS: 4 10*3/uL (ref 1.4–7.7)
NEUTROS PCT: 64.8 % (ref 43.0–77.0)
PLATELETS: 190 10*3/uL (ref 150.0–400.0)
RBC: 4.34 Mil/uL (ref 3.87–5.11)
RDW: 15 % (ref 11.5–15.5)
WBC: 6.1 10*3/uL (ref 4.0–10.5)

## 2015-11-11 MED ORDER — ALPRAZOLAM 0.5 MG PO TABS: 0.5000 mg | ORAL_TABLET | Freq: Two times a day (BID) | ORAL | Status: DC | PRN

## 2015-11-11 NOTE — Patient Instructions (Signed)
Cancel your appt with me next week and reschedule your physical We'll notify you of your lab results and make any changes if needed No medication changes at this time- follow the instructions to get off the Zoloft and continue the Wellbutrin Call with any questions or concerns If you want to join [place] at the new [place] office, any scheduled appointments will automatically transfer and we will see you at [address] [place] [address] [place], [place], [place] [address] (OPENING [place]) [place] in there!!!

## 2015-11-11 NOTE — Progress Notes (Signed)
Pre visit review using our clinic review tool, if applicable. No additional management support is needed unless otherwise documented below in the visit note. 

## 2015-11-11 NOTE — Progress Notes (Signed)
   Subjective:    Patient ID: Tina Fitzpatrick, female    DOB: 1951-09-14, 64 y.o.   MRN: [id number]  HPI Hospital f/u- admitted 2/15-18 for falls and memory issues.  Pt's labs initially showed UTI but culture was negative.  Pt completed course of Keflex as directed.  Psych was consulted in hospital and recommended she taper off the Zoloft and start Wellbutrin.  Elavil was stopped.  Husband reports she is 'doing good.  She really is'.  She is walking regularly and getting out more.  Pt has appt w/ Neuro on Monday.  Pt reports sometimes she is able to remember and other times 'it doesn't feel right'.  No CP, SOB, HAs, visual changes, abd pain, N/V.   Review of Systems For ROS see HPI     Objective:   Physical Exam  Constitutional: She appears well-developed and well-nourished. No distress.  HENT:  Head: Normocephalic and atraumatic.  Eyes: Conjunctivae and EOM are normal. Pupils are equal, round, and reactive to light.  Neck: Normal range of motion. Neck supple. No thyromegaly present.  Cardiovascular: Normal rate, regular rhythm, normal heart sounds and intact distal pulses.   No murmur heard. Pulmonary/Chest: Effort normal and breath sounds normal. No respiratory distress.  Abdominal: Soft. She exhibits no distension. There is no tenderness.  Musculoskeletal: She exhibits no edema.  Lymphadenopathy:    She has no cervical adenopathy.  Neurological: She is alert. No cranial nerve deficit.  Skin: Skin is warm and dry.  Psychiatric: She has a normal mood and affect. Her behavior is normal.  Vitals reviewed.         Assessment & Plan:

## 2015-11-12 NOTE — Assessment & Plan Note (Signed)
Unclear as to cause since she did not have UTI on culture.  Husband reports she is doing better as she tapers off Zoloft and is now on Wellbutrin.  Check CBC and BMP as recommended in D/C summary.  Pt has f/u w/ neuro scheduled.  Reviewed supportive care and red flags that should prompt return.  Pt expressed understanding and is in agreement w/ plan.

## 2015-11-18 ENCOUNTER — Encounter: Payer: Self-pay | Admitting: Family Medicine

## 2015-11-18 ENCOUNTER — Ambulatory Visit (INDEPENDENT_AMBULATORY_CARE_PROVIDER_SITE_OTHER): Payer: Medicare Other | Admitting: Neurology

## 2015-11-18 ENCOUNTER — Encounter: Payer: Self-pay | Admitting: Neurology

## 2015-11-18 VITALS — BP 118/74 | HR 69 | Resp 16 | Wt 159.0 lb

## 2015-11-18 DIAGNOSIS — F0390 Unspecified dementia without behavioral disturbance: Secondary | ICD-10-CM

## 2015-11-18 DIAGNOSIS — R413 Other amnesia: Secondary | ICD-10-CM

## 2015-11-18 DIAGNOSIS — F03A0 Unspecified dementia, mild, without behavioral disturbance, psychotic disturbance, mood disturbance, and anxiety: Secondary | ICD-10-CM

## 2015-11-18 NOTE — Patient Instructions (Signed)
1. Refer for Neurocognitive testing at [facility] 2. Continue all your medications 3. Physical exercise and brain stimulation exercises are important for brain health 4. Follow-up in 6 months

## 2015-11-18 NOTE — Progress Notes (Signed)
NEUROLOGY FOLLOW UP OFFICE NOTE  Tina Fitzpatrick [id number]  HISTORY OF PRESENT ILLNESS: I had the pleasure of seeing Tina Fitzpatrick in follow-up in the neurology clinic on 11/18/2015. She is again accompanied by her husband who helps supplement the history today.  The patient was last seen 4 months ago for memory loss and confusion. MMSE in October 2016 was 19/30. Records and images were personally reviewed where available. She underwent a lumbar puncture which was unremarkable, protein normal. There was note of one oligoclonal band suggestive of monoclonal gammopathy. I do not see results from protein 14-3-3, this will be requested from the lab. Since her last visit, she was admitted to [facility] last 10/30/15 for altered mental status. Her husband reports he came home from work to find her still in bed with bowel incontinence. He brought her to the toilet and heard her fall off the commode, with eyes open but not recognizing him. He tells me EMS reports low blood pressure. Bloodwork showed normal CBC, potassium of 2.4, UDS positive for benzodiazepines, EtOH level <5, +UTI. She was treated with IV antibiotics. During her stay, she was also evaluated by psychiatry and switched from Zoloft to Wellbutrin, she will be tapering off Zoloft in the next 2 days. Her husband reports that she is doing much better. He is in charge of her medications now. Compared to previous visit, Trazodone is off her medication list as well. She reports doing well, no headaches, dizziness, diplopia, focal numbness/tingling/weakness. No hallucinations. Her husband thinks she is doing good, she is getting out of the house more. She does not drive. No difficulties with dressing/bathing.   HPI: This is a pleasant 65 yo RH woman with a history of hypertension, hyperlipidemia, depression, anxiety, who presented with a 1-1/2 year history of memory loss and confusion. Her husband supplies majority of the history, she became upset stating she did  not know about what he was talking about. Her husband reports she was in her usual state of health until 1-1/2 years ago when she started becoming more forgetful, putting things in the wrong place, unable to recall 10 minutes later if she had already taken her medication. She has gotten ice cream out of the freezer then puts it back in the fridge, or gets a drink and lays it on the floor and does not recall doing this. She stopped driving 2 years ago because she got lost coming back from her sister's house in [place]. She has confusion "all the time," worsening on a daily basis. She reports that he gets upset with her and "hollers at me." The husband on the other hand, states that she talks and he has not idea what she is talking about, making her upset, then the situation worsens when he tries to help her. Over the past 6-7 months, she would "talk out of her head," one time calling her husband at work saying she with with her daughter (who lives in the [place]), stating she drove to see her. He does not help her with her medications and thinks she takes them, she states she has no idea what the medications are for, and gets irate at her husband. She had called her PCP's office last 04/10/15, with note of slurred speech and confusion. She stated she did not know where she was, she was in a blue and yellow room and that was all she knew. She was difficult to understand, and reported she was scared and her husband was at work.  Staff called 911 and patient was brought to [facility] ER where she was diagnosed with a UTI. CBC and BMP were unremarkable. I personally reviewed head CT without contrast which was normal. She saw her PCP and had more bloodwork done, including normal B12, folate, TSH. She reports seeing neurologist Dr. Sabra Heck in the past, but does not recall what he was seeing her for. Her husband reports he was treating her for neck pain. She herself feels her memory is not so good, she feels she can remember  things but the words do not go where they are supposed to. She denies any history of concussions, no alcohol use. No recent medication changes, she reports taking Xanax every 6 hours for anxiety. Her mother had memory changes.   Diagnostic Data:  I personally reviewed MRI brain with and without contrast which did not show any acute changes, there was mild diffuse atrophy and mild chronic microvascular disease. Wake and sleep EEG normal.  ESR, HIV, RPR unremarkable  Component     Latest Ref Rng 08/06/2015        11:12 AM  Tube #      3  Color, CSF     COLORLESS COLORLESS  Appearance, CSF     CLEAR CLEAR  Supernatant     COLORLESS NOT INDICATED  WBC, CSF     0 - 5 cu mm 1  RBC Count, CSF     0 cu mm 0  Segmented Neutrophils-CSF     0 - 6 % NOT PERFORMED  Lymphs, CSF     40 - 80 % NOT PERFORMED  Monocyte/Macrophage     15 - 45 % NOT PERFORMED  Eosinophils, CSF     0 - 1 % NOT PERFORMED  Other Cells, CSF      SEE NOTE  Clinical Information      NOT PROVIDED  Specimen Type      CSF  Viability      25  Interpretation      REPORT  Sample Description:      REPORT  Gating Strategy:      REPORT  Number of markers:      14  Markers      REPORT  Source      Not Provided  Clinical Indication      Not Provided  Specimen Source      CSF  Cryptococcal Ag Screen     Not Detected Not Detected  Additional Testing      REPORT  Gram Stain        Organism ID, Bacteria      No Fungi Isolated in 4 Weeks  Smear Result      No Yeast or Fungal Elements Seen  Glucose, CSF     43 - 76 mg/dL 64  Total  Protein, CSF     15 - 45 mg/dL 38  CSF Oligoclonal Bands      REPORT  Myelin Basic Protein     0.0 - 4.0 mcg/L <2.0  VDRL Quant, CSF     Nonreactive Nonreactive   PAST MEDICAL HISTORY: Past Medical History  Diagnosis Date  . GERD (gastroesophageal reflux disease)   . Hypertension   . Hyperlipidemia   . Depression   . DDD (degenerative disc disease), cervical   .  Kidney stones     MEDICATIONS: Current Outpatient Prescriptions on File Prior to Visit  Medication Sig Dispense Refill  . ALPRAZolam (XANAX) 0.5 MG tablet Take 1 tablet (  0.5 mg total) by mouth 2 (two) times daily as needed for anxiety. 60 tablet 3  . aspirin 81 MG tablet Take 81 mg by mouth daily.    Marland Kitchen atenolol (TENORMIN) 50 MG tablet TAKE ONE TABLET BY MOUTH ONCE DAILY 30 tablet 6  . buPROPion (WELLBUTRIN SR) 150 MG 12 hr tablet Take 1 tablet (150 mg total) by mouth daily. 30 tablet 1  . fenofibrate 160 MG tablet TAKE ONE TABLET BY MOUTH ONCE DAILY 30 tablet 6  . folic acid (FOLVITE) 1 MG tablet Take 1 tablet (1 mg total) by mouth daily. 30 tablet 4  . lisinopril (PRINIVIL,ZESTRIL) 5 MG tablet TAKE ONE TABLET BY MOUTH ONCE DAILY 30 tablet 6  . sertraline (ZOLOFT) 100 MG tablet Take 1 tablet (100 mg total) by mouth daily. Please take 150m (1tab) daily for next 5 days, then taper to 510m(1/2 tab) for 2 weeks, then OFF    . simvastatin (ZOCOR) 40 MG tablet TAKE ONE TABLET BY MOUTH IN THE EVENING 90 tablet 1   No current facility-administered medications on file prior to visit.    ALLERGIES: No Known Allergies  FAMILY HISTORY: Family History  Problem Relation Age of Onset  . Heart attack Mother     SOCIAL HISTORY: Social History   Social History  . Marital Status: Married    Spouse Name: N/A  . Number of Children: 2  . Years of Education: N/A   Occupational History  . Not on file.   Social History Main Topics  . Smoking status: Former Smoker    Types: Cigarettes  . Smokeless tobacco: Never Used  . Alcohol Use: No  . Drug Use: No  . Sexual Activity: Yes   Other Topics Concern  . Not on file   Social History Narrative   Married with 2 children   Right handed    12 th grade    1 cup daily    REVIEW OF SYSTEMS: Constitutional: No fevers, chills, or sweats, no generalized fatigue, change in appetite Eyes: No visual changes, double vision, eye pain Ear, nose and  throat: No hearing loss, ear pain, nasal congestion, sore throat Cardiovascular: No chest pain, palpitations Respiratory:  No shortness of breath at rest or with exertion, wheezes GastrointestinaI: No nausea, vomiting, diarrhea, abdominal pain, fecal incontinence Genitourinary:  No dysuria, urinary retention or frequency Musculoskeletal:  No neck pain, back pain Integumentary: No rash, pruritus, skin lesions Neurological: as above Psychiatric: No depression, insomnia, anxiety Endocrine: No palpitations, fatigue, diaphoresis, mood swings, change in appetite, change in weight, increased thirst Hematologic/Lymphatic:  No anemia, purpura, petechiae. Allergic/Immunologic: no itchy/runny eyes, nasal congestion, recent allergic reactions, rashes  PHYSICAL EXAM: Filed Vitals:   11/18/15 0819  BP: 118/74  Pulse: 69  Resp: 16   General: No acute distress Head:  Normocephalic/atraumatic Neck: supple, no paraspinal tenderness, full range of motion Heart:  Regular rate and rhythm Lungs:  Clear to auscultation bilaterally Back: No paraspinal tenderness Skin/Extremities: No rash, no edema Neurological Exam: alert and oriented to person, place, month/year/day of week. No aphasia or dysarthria. Fund of knowledge is appropriate.  Attention and concentration are normal.    Able to name objects and repeat phrases.  MMSE - Mini Mental State Exam 11/18/2015 06/24/2015  Orientation to time 3 2  Orientation to Place 5 5  Registration 3 3  Attention/ Calculation 5 2  Recall 0 0  Language- name 2 objects 2 2  Language- repeat 1 1  Language- follow 3  step command 3 2  Language- read & follow direction 1 1  Write a sentence 1 1  Copy design 0 0  Total score 24 19    Cranial nerves: Pupils equal, round, reactive to light.  Extraocular movements intact with no nystagmus. Visual fields full. Facial sensation intact. No facial asymmetry. Tongue, uvula, palate midline.  Motor: Bulk and tone normal, muscle  strength 5/5 throughout with no pronator drift.  Sensation to light touch intact.  No extinction to double simultaneous stimulation.  Deep tendon reflexes 2+ throughout, toes downgoing.  Finger to nose testing intact.  Gait narrow-based and steady, able to tandem walk adequately.  Romberg negative.  IMPRESSION: This is a pleasant 65 yo RH woman with a history of hypertension, hyperlipidemia, depression, anxiety, who presented for worsening mental status since 2016, possible early onset dementia, however with the fairly rapid onset reported on previous visits, she underwent an extensive workup with unremarkable MRI brain except for mild diffuse atrophy, EEG normal, bloodwork and CSF unremarkable. I do not see results from protein 14-3-3, we will follow-up on this. Her husband feels she has improved since hospital stay of UTI (she had worsening confusion), psychiatric medications were also adjusted, she is now on Wellbutrin and off Trazodone, husband in charge of medications. MMSE today 24/30 (previously 19/30 in October 2016). She did not want to go to Neuropsychological eval on last visit but agrees to proceed at this time, referral will be sent. She will follow-up in 6 months and knows to call for any changes.   Thank you for allowing me to participate in her care.  Please do not hesitate to call for any questions or concerns.  The duration of this appointment visit was 25 minutes of face-to-face time with the patient.  Greater than 50% of this time was spent in counseling, explanation of diagnosis, planning of further management, and coordination of care.   Ellouise Newer, M.D.   CC: Dr. Birdie Riddle

## 2016-01-07 ENCOUNTER — Encounter: Payer: Medicare Other | Admitting: Family Medicine

## 2016-01-07 DIAGNOSIS — Z0289 Encounter for other administrative examinations: Secondary | ICD-10-CM

## 2016-01-08 ENCOUNTER — Encounter: Payer: Self-pay | Admitting: Family Medicine

## 2016-01-08 ENCOUNTER — Telehealth: Payer: Self-pay | Admitting: Family Medicine

## 2016-01-08 NOTE — Telephone Encounter (Signed)
Marked to charge and mailing no show letter °

## 2016-01-08 NOTE — Telephone Encounter (Signed)
Yes- please charge 

## 2016-01-08 NOTE — Telephone Encounter (Signed)
Pt was no show 01/07/16 for cpe appt, pt has not rescheduled, 1st no show I see but pt has multiple cancellations in history, charge or  No charge?

## 2016-01-12 ENCOUNTER — Other Ambulatory Visit: Payer: Self-pay | Admitting: Family Medicine

## 2016-01-21 ENCOUNTER — Other Ambulatory Visit: Payer: Self-pay | Admitting: Family Medicine

## 2016-01-21 ENCOUNTER — Telehealth: Payer: Self-pay | Admitting: Family Medicine

## 2016-01-21 NOTE — Telephone Encounter (Signed)
Received fax from [facility] that patient no showed her 12/02/15 appt & did not reschedule. This was the 2nd time we referred patient, the 1st time she wouldn't call them back after messages left to schedule appt.

## 2016-01-22 NOTE — Telephone Encounter (Signed)
Medication filled to pharmacy as requested.   

## 2016-02-05 DIAGNOSIS — G43009 Migraine without aura, not intractable, without status migrainosus: Secondary | ICD-10-CM | POA: Diagnosis not present

## 2016-02-05 DIAGNOSIS — F411 Generalized anxiety disorder: Secondary | ICD-10-CM | POA: Diagnosis not present

## 2016-02-05 DIAGNOSIS — F5104 Psychophysiologic insomnia: Secondary | ICD-10-CM | POA: Diagnosis not present

## 2016-03-15 ENCOUNTER — Other Ambulatory Visit: Payer: Self-pay | Admitting: Family Medicine

## 2016-03-18 NOTE — Telephone Encounter (Signed)
Medication filled to pharmacy as requested.   

## 2016-04-03 ENCOUNTER — Other Ambulatory Visit: Payer: Self-pay | Admitting: Family Medicine

## 2016-04-15 ENCOUNTER — Other Ambulatory Visit: Payer: Self-pay | Admitting: General Practice

## 2016-04-15 ENCOUNTER — Telehealth: Payer: Self-pay | Admitting: General Practice

## 2016-04-15 MED ORDER — METOPROLOL TARTRATE 50 MG PO TABS: 50.0000 mg | ORAL_TABLET | Freq: Two times a day (BID) | ORAL | 6 refills | Status: DC

## 2016-04-15 NOTE — Telephone Encounter (Signed)
Ok to switch to Metoprolol 50mg  BID in place of atenolol.

## 2016-04-15 NOTE — Telephone Encounter (Signed)
New med filled to pharmacy and pt left a detailed message to inform of the med change.

## 2016-04-15 NOTE — Telephone Encounter (Signed)
Received a fax from [facility] to advise that atenolol 25,50, and 100mg  are all on backorder and an alternative medication is requested

## 2016-05-13 ENCOUNTER — Other Ambulatory Visit: Payer: Self-pay | Admitting: Family Medicine

## 2016-05-14 ENCOUNTER — Other Ambulatory Visit: Payer: Self-pay | Admitting: Family Medicine

## 2016-05-19 ENCOUNTER — Other Ambulatory Visit: Payer: Self-pay | Admitting: Family Medicine

## 2016-05-20 ENCOUNTER — Telehealth: Payer: Self-pay | Admitting: General Practice

## 2016-05-20 MED ORDER — METOPROLOL TARTRATE 50 MG PO TABS: 50.0000 mg | ORAL_TABLET | Freq: Two times a day (BID) | ORAL | 6 refills | Status: DC

## 2016-05-20 NOTE — Telephone Encounter (Signed)
Our office received a fax today in regards to patient atenolol 50mg . Per pharmacy there is a nationwide back order on atenolol 25, 50, and 100 mg.   Please advise?

## 2016-05-20 NOTE — Telephone Encounter (Signed)
Medication was changed to metoprolol back in August due to backorder- ok to resend Metoprolol

## 2016-05-20 NOTE — Telephone Encounter (Signed)
Noted resent rx to pharmacy

## 2016-05-22 ENCOUNTER — Ambulatory Visit: Payer: Self-pay | Admitting: Neurology

## 2016-05-22 DIAGNOSIS — Z029 Encounter for administrative examinations, unspecified: Secondary | ICD-10-CM

## 2016-05-27 ENCOUNTER — Other Ambulatory Visit: Payer: Self-pay | Admitting: Family Medicine

## 2016-06-30 ENCOUNTER — Other Ambulatory Visit: Payer: Self-pay | Admitting: Family Medicine

## 2016-07-01 ENCOUNTER — Other Ambulatory Visit: Payer: Self-pay | Admitting: Family Medicine

## 2016-07-01 NOTE — Telephone Encounter (Signed)
Cannot fill med, pt is overdue for a BP followup

## 2016-07-02 ENCOUNTER — Telehealth: Payer: Self-pay | Admitting: Family Medicine

## 2016-07-02 DIAGNOSIS — G43009 Migraine without aura, not intractable, without status migrainosus: Secondary | ICD-10-CM | POA: Diagnosis not present

## 2016-07-02 NOTE — Telephone Encounter (Signed)
Pt needs refill on lisinopril, [facility] on [address].

## 2016-07-02 NOTE — Telephone Encounter (Signed)
We cannot fill this medication without patient being seen, We have notated this on the refill denials.

## 2016-07-03 MED ORDER — LISINOPRIL 5 MG PO TABS: ORAL_TABLET | ORAL | 0 refills | Status: DC

## 2016-07-03 NOTE — Telephone Encounter (Signed)
Pt did make appt for 11/20. Should pt have sooner appt?

## 2016-07-03 NOTE — Telephone Encounter (Signed)
I filled this medication for 30 days. If she does not keep appt I cannot fill it again.

## 2016-07-14 ENCOUNTER — Other Ambulatory Visit: Payer: Self-pay | Admitting: Family Medicine

## 2016-07-14 NOTE — Telephone Encounter (Signed)
Med filled since patient made an appt for 08/03/16.

## 2016-07-25 ENCOUNTER — Other Ambulatory Visit: Payer: Self-pay | Admitting: Family Medicine

## 2016-08-03 ENCOUNTER — Ambulatory Visit: Payer: Self-pay | Admitting: Family Medicine

## 2016-08-03 DIAGNOSIS — Z0289 Encounter for other administrative examinations: Secondary | ICD-10-CM

## 2016-08-12 DIAGNOSIS — G43009 Migraine without aura, not intractable, without status migrainosus: Secondary | ICD-10-CM | POA: Diagnosis not present

## 2016-09-15 ENCOUNTER — Other Ambulatory Visit: Payer: Self-pay | Admitting: Family Medicine

## 2016-09-26 ENCOUNTER — Other Ambulatory Visit: Payer: Self-pay | Admitting: Family Medicine

## 2016-10-03 ENCOUNTER — Other Ambulatory Visit: Payer: Self-pay | Admitting: Family Medicine

## 2016-10-05 ENCOUNTER — Other Ambulatory Visit: Payer: Self-pay | Admitting: Family Medicine

## 2016-10-23 ENCOUNTER — Other Ambulatory Visit: Payer: Self-pay | Admitting: Family Medicine

## 2016-10-25 ENCOUNTER — Other Ambulatory Visit: Payer: Self-pay | Admitting: Family Medicine

## 2016-10-30 ENCOUNTER — Encounter: Payer: Self-pay | Admitting: Behavioral Health

## 2016-10-30 ENCOUNTER — Telehealth: Payer: Self-pay | Admitting: Behavioral Health

## 2016-10-30 NOTE — Telephone Encounter (Signed)
Spoke to patient's spouse and he voiced that the patient will update Pre-Visit info at the appointment on 11/02/16.

## 2016-11-02 ENCOUNTER — Ambulatory Visit (INDEPENDENT_AMBULATORY_CARE_PROVIDER_SITE_OTHER): Payer: Medicare Other | Admitting: Family Medicine

## 2016-11-02 VITALS — BP 133/64 | HR 66 | Temp 98.0°F | Ht 63.0 in | Wt 157.8 lb

## 2016-11-02 DIAGNOSIS — Z23 Encounter for immunization: Secondary | ICD-10-CM | POA: Diagnosis not present

## 2016-11-02 DIAGNOSIS — E785 Hyperlipidemia, unspecified: Secondary | ICD-10-CM | POA: Diagnosis not present

## 2016-11-02 DIAGNOSIS — Z1159 Encounter for screening for other viral diseases: Secondary | ICD-10-CM | POA: Diagnosis not present

## 2016-11-02 DIAGNOSIS — I10 Essential (primary) hypertension: Secondary | ICD-10-CM | POA: Diagnosis not present

## 2016-11-02 DIAGNOSIS — R413 Other amnesia: Secondary | ICD-10-CM | POA: Diagnosis not present

## 2016-11-02 DIAGNOSIS — Z130 Encounter for screening for diseases of the blood and blood-forming organs and certain disorders involving the immune mechanism: Secondary | ICD-10-CM

## 2016-11-02 DIAGNOSIS — Z131 Encounter for screening for diabetes mellitus: Secondary | ICD-10-CM | POA: Diagnosis not present

## 2016-11-02 LAB — CBC
HEMATOCRIT: 39.3 % (ref 36.0–46.0)
Hemoglobin: 13.1 g/dL (ref 12.0–15.0)
MCHC: 33.3 g/dL (ref 30.0–36.0)
MCV: 87.2 fl (ref 78.0–100.0)
Platelets: 221 10*3/uL (ref 150.0–400.0)
RBC: 4.5 Mil/uL (ref 3.87–5.11)
RDW: 13.3 % (ref 11.5–15.5)
WBC: 4.8 10*3/uL (ref 4.0–10.5)

## 2016-11-02 LAB — COMPREHENSIVE METABOLIC PANEL
ALK PHOS: 46 U/L (ref 39–117)
ALT: 11 U/L (ref 0–35)
AST: 27 U/L (ref 0–37)
Albumin: 4.5 g/dL (ref 3.5–5.2)
BILIRUBIN TOTAL: 0.5 mg/dL (ref 0.2–1.2)
BUN: 13 mg/dL (ref 6–23)
CO2: 28 meq/L (ref 19–32)
Calcium: 9.9 mg/dL (ref 8.4–10.5)
Chloride: 106 mEq/L (ref 96–112)
Creatinine, Ser: 0.78 mg/dL (ref 0.40–1.20)
GFR: 78.94 mL/min (ref >60.00)
GLUCOSE: 104 mg/dL — AB (ref 70–99)
Potassium: 5 mEq/L (ref 3.5–5.1)
SODIUM: 141 meq/L (ref 135–145)
TOTAL PROTEIN: 7.4 g/dL (ref 6.0–8.3)

## 2016-11-02 LAB — LIPID PANEL
CHOL/HDL RATIO: 5
Cholesterol: 165 mg/dL (ref 0–200)
HDL: 33.1 mg/dL — ABNORMAL LOW (ref >39.00)
LDL Cholesterol: 100 mg/dL — ABNORMAL HIGH (ref 0–99)
NONHDL: 131.89
Triglycerides: 160 mg/dL — ABNORMAL HIGH (ref 0.0–149.0)
VLDL: 32 mg/dL (ref 0.0–40.0)

## 2016-11-02 LAB — HEMOGLOBIN A1C: HEMOGLOBIN A1C: 5.1 % (ref 4.6–6.5)

## 2016-11-02 MED ORDER — LISINOPRIL 5 MG PO TABS: ORAL_TABLET | ORAL | 3 refills | Status: DC

## 2016-11-02 MED ORDER — FENOFIBRATE 160 MG PO TABS: 160.0000 mg | ORAL_TABLET | Freq: Every day | ORAL | 3 refills | Status: DC

## 2016-11-02 MED ORDER — SIMVASTATIN 40 MG PO TABS: 40.0000 mg | ORAL_TABLET | Freq: Every evening | ORAL | 3 refills | Status: DC

## 2016-11-02 MED ORDER — ATENOLOL 50 MG PO TABS: 50.0000 mg | ORAL_TABLET | Freq: Every day | ORAL | 3 refills | Status: DC

## 2016-11-02 NOTE — Progress Notes (Signed)
Pre visit review using our clinic review tool, if applicable. No additional management support is needed unless otherwise documented below in the visit note. 

## 2016-11-02 NOTE — Progress Notes (Signed)
[facility] [address] [place], [place] [address] [phone]  Date:  11/02/2016   Name:  Tina Fitzpatrick   DOB:  01-09-1951   MRN:  [id number]  PCP:  Abbe Amsterdam, MD    Chief Complaint: Transferring (Former pt of Dr. Beverely Low. Here to est care. Flu vaccine today given today.  )   History of Present Illness:  Tina Fitzpatrick is a 66 y.o. very pleasant female patient who presents with the following:  She is s a former pt of Dr. Beverely Low- here today to establish care She sees Dr. 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  Unable to recall that Valentines day was last week.    Patient Active Problem List   Diagnosis Date Noted  . 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 Memory loss or impairment   . Orthostatic hypotension 10/30/2015  . Hypokalemia 10/30/2015  . UTI (lower urinary tract infection) 10/30/2015  . Elevated troponin 10/30/2015  . Metabolic encephalopathy 10/30/2015  . Mild dementia 07/29/2015  . Encephalopathy, metabolic 06/24/2015  . Facial twitching 06/24/2015  . Memory loss 05/09/2015  . Syncope 06/22/2013  . Neck  pain on left side 06/22/2013  . Screening for malignant neoplasm of the cervix 06/27/2012  . General medical examination 02/10/2012  . GERD (gastroesophageal reflux disease)   . Essential hypertension   . Hyperlipidemia   . Depression   . DDD (degenerative disc disease), cervical     Past Medical History:  Diagnosis Date  . DDD (degenerative disc disease), cervical   . Depression   . GERD (gastroesophageal reflux disease)   . Hyperlipidemia   . Hypertension   . Kidney stones     Past Surgical History:  Procedure Laterality Date  . kidney stones  1988  . NECK SURGERY  2008   Plate/Screws    Social History  Substance Use Topics  . Smoking status: Former Smoker    Types: Cigarettes  . Smokeless tobacco: Never Used  . Alcohol use No    Family History  Problem Relation Age of Onset  . Heart attack Mother     No Known Allergies  Medication list has been reviewed and updated.  Current Outpatient Prescriptions on File Prior to Visit  Medication Sig Dispense Refill  . ALPRAZolam (XANAX) 0.5 MG tablet Take 1 tablet (0.5 mg total) by mouth 2 (two) times daily as needed for anxiety. 60 tablet 3  . aspirin 81 MG tablet Take 81 mg by mouth daily.    Marland Kitchen atenolol (TENORMIN) 50 MG tablet TAKE  ONE TABLET BY MOUTH ONCE DAILY 30 tablet 3  . buPROPion (WELLBUTRIN SR) 150 MG 12 hr tablet Take 1 tablet (150 mg total) by mouth daily. 30 tablet 1  . fenofibrate 160 MG tablet TAKE ONE TABLET BY MOUTH ONCE DAILY 30 tablet 6  . folic acid (FOLVITE) 1 MG tablet Take 1 tablet (1 mg total) by mouth daily. 30 tablet 4  . lisinopril (PRINIVIL,ZESTRIL) 5 MG tablet TAKE ONE TABLET BY MOUTH ONCE DAILY (PLEASE  CALL  [phone]  TO  SCHEDULE  A  BLOOD  PRESSURE  FOLLOW  UP) 30 tablet 0  . metoprolol (LOPRESSOR) 50 MG tablet Take 1 tablet (50 mg total) by mouth 2 (two) times daily. Will take place of atenolol until backorder is over. 60 tablet 6  . sertraline (ZOLOFT) 100 MG tablet Take 1 tablet  (100 mg total) by mouth daily. Please take 100mg  (1tab) daily for next 5 days, then taper to 50mg  (1/2 tab) for 2 weeks, then OFF    . simvastatin (ZOCOR) 40 MG tablet TAKE ONE TABLET BY MOUTH IN THE EVENING 90 tablet 0   No current facility-administered medications on file prior to visit.     Review of Systems:  As per HPI- otherwise negative.   Physical Examination: Vitals:   11/02/16 1307  BP: 133/64  Pulse: 66  Temp: 98 F (36.7 C)   Vitals:   11/02/16 1307  Weight: 157 lb 12.8 oz (71.6 kg)  Height: 5\' 3"  (1.6 m)   Body mass index is 27.95 kg/m. Ideal Body Weight: Weight in (lb) to have BMI = 25: 140.8  GEN: WDWN, NAD, Non-toxic, A & O x 3, appears well and healthy  HEENT: Atraumatic, Normocephalic. Neck supple. No masses, No LAD. Ears and Nose: No external deformity. CV: RRR, No M/G/R. No JVD. No thrill. No extra heart sounds. PULM: CTA B, no wheezes, crackles, rhonchi. No retractions. No resp. distress. No accessory muscle use. EXTR: No c/c/e NEURO Normal gait.  PSYCH: Normally interactive. Conversant. Not depressed or anxious appearing.  Calm demeanor.  She does have some difficulty with her memory evident during out interaction but is quite alert and pleasant    Assessment and Plan: Need for influenza vaccination - Plan: Flu Vaccine QUAD 36+ mos IM (Fluarix & Fluzone Quad PF  Hyperlipidemia, unspecified hyperlipidemia type - Plan: fenofibrate 160 MG tablet, Lipid panel, DISCONTINUED: simvastatin (ZOCOR) 40 MG tablet  Essential hypertension - Plan: lisinopril (PRINIVIL,ZESTRIL) 5 MG tablet, atenolol (TENORMIN) 50 MG tablet  Encounter for hepatitis C screening test for low risk patient - Plan: Hepatitis C antibody  Screening for diabetes mellitus - Plan: Comprehensive metabolic panel, Hemoglobin A1c  Screening for deficiency anemia - Plan: CBC  Memory loss  Here today for a follow-up visit and to establish care with me today Her BP is under ok control with  current medications Hep C screening pending Screening for DM Flu shot today Fenofibrate for hyperlipidemia- did NOT refill simvastatin as she is now on fenofibrate   Meds ordered this encounter  Medications  . DISCONTD: simvastatin (ZOCOR) 40 MG tablet    Sig: Take 1 tablet (40 mg total) by mouth every evening.    Dispense:  90 tablet    Refill:  3  . lisinopril (PRINIVIL,ZESTRIL) 5 MG tablet    Sig: TAKE ONE TABLET BY MOUTH ONCE DAILY    Dispense:  90 tablet    Refill:  3    Please consider 90 day supplies to promote  better adherence  . fenofibrate 160 MG tablet    Sig: Take 1 tablet (160 mg total) by mouth daily.    Dispense:  90 tablet    Refill:  3  . atenolol (TENORMIN) 50 MG tablet    Sig: Take 1 tablet (50 mg total) by mouth daily.    Dispense:  90 tablet    Refill:  3     Signed Abbe Amsterdam, MD

## 2016-11-02 NOTE — Patient Instructions (Signed)
It was very nice to see you today- take care and I will be in touch with your labs soon.  You need to have a mammogram- you may be able to get this done today downstairs if you like!

## 2016-11-03 ENCOUNTER — Encounter: Payer: Self-pay | Admitting: Family Medicine

## 2016-11-03 LAB — HEPATITIS C ANTIBODY: HCV AB: NEGATIVE

## 2016-11-16 ENCOUNTER — Other Ambulatory Visit: Payer: Self-pay | Admitting: Family Medicine

## 2017-01-06 DIAGNOSIS — G43009 Migraine without aura, not intractable, without status migrainosus: Secondary | ICD-10-CM | POA: Diagnosis not present

## 2017-07-06 ENCOUNTER — Encounter (HOSPITAL_COMMUNITY): Payer: Self-pay | Admitting: Emergency Medicine

## 2017-07-06 ENCOUNTER — Emergency Department (HOSPITAL_COMMUNITY)
Admission: EM | Admit: 2017-07-06 | Discharge: 2017-07-06 | Disposition: A | Discharge status: Home / Self Care | Payer: Medicare Other | Attending: Emergency Medicine | Admitting: Emergency Medicine

## 2017-07-06 DIAGNOSIS — F0390 Unspecified dementia without behavioral disturbance: Secondary | ICD-10-CM | POA: Diagnosis present

## 2017-07-06 DIAGNOSIS — Z79899 Other long term (current) drug therapy: Secondary | ICD-10-CM | POA: Diagnosis not present

## 2017-07-06 DIAGNOSIS — Z7982 Long term (current) use of aspirin: Secondary | ICD-10-CM | POA: Diagnosis not present

## 2017-07-06 DIAGNOSIS — F329 Major depressive disorder, single episode, unspecified: Secondary | ICD-10-CM | POA: Diagnosis not present

## 2017-07-06 DIAGNOSIS — R402441 Other coma, without documented Glasgow coma scale score, or with partial score reported, in the field [EMT or ambulance]: Secondary | ICD-10-CM | POA: Diagnosis not present

## 2017-07-06 DIAGNOSIS — Z87891 Personal history of nicotine dependence: Secondary | ICD-10-CM | POA: Insufficient documentation

## 2017-07-06 DIAGNOSIS — I10 Essential (primary) hypertension: Secondary | ICD-10-CM | POA: Diagnosis not present

## 2017-07-06 HISTORY — DX: Unspecified dementia, unspecified severity, without behavioral disturbance, psychotic disturbance, mood disturbance, and anxiety: F03.90

## 2017-07-06 LAB — URINALYSIS, ROUTINE W REFLEX MICROSCOPIC
BILIRUBIN URINE: NEGATIVE
Glucose, UA: NEGATIVE mg/dL
HGB URINE DIPSTICK: NEGATIVE
KETONES UR: NEGATIVE mg/dL
Leukocytes, UA: NEGATIVE
Nitrite: NEGATIVE
PROTEIN: 30 mg/dL — AB
RBC / HPF: NONE SEEN RBC/hpf (ref 0–5)
SPECIFIC GRAVITY, URINE: 1.027 (ref 1.005–1.030)
SQUAMOUS EPITHELIAL / LPF: NONE SEEN
WBC UA: NONE SEEN WBC/hpf (ref 0–5)
pH: 5 (ref 5.0–8.0)

## 2017-07-06 LAB — RAPID URINE DRUG SCREEN, HOSP PERFORMED
Amphetamines: NOT DETECTED
BARBITURATES: NOT DETECTED
Benzodiazepines: POSITIVE — AB
Cocaine: NOT DETECTED
Opiates: NOT DETECTED
Tetrahydrocannabinol: NOT DETECTED

## 2017-07-06 LAB — COMPREHENSIVE METABOLIC PANEL
ALT: 19 U/L (ref 14–54)
AST: 30 U/L (ref 15–41)
Albumin: 4.3 g/dL (ref 3.5–5.0)
Alkaline Phosphatase: 45 U/L (ref 38–126)
Anion gap: 10 (ref 5–15)
BUN: 12 mg/dL (ref 6–20)
CHLORIDE: 113 mmol/L — AB (ref 101–111)
CO2: 22 mmol/L (ref 22–32)
Calcium: 9.6 mg/dL (ref 8.9–10.3)
Creatinine, Ser: 0.81 mg/dL (ref 0.44–1.00)
Glucose, Bld: 116 mg/dL — ABNORMAL HIGH (ref 65–99)
Potassium: 3.2 mmol/L — ABNORMAL LOW (ref 3.5–5.1)
Sodium: 145 mmol/L (ref 135–145)
Total Bilirubin: 1.3 mg/dL — ABNORMAL HIGH (ref 0.3–1.2)
Total Protein: 7.1 g/dL (ref 6.5–8.1)

## 2017-07-06 LAB — CBC WITH DIFFERENTIAL/PLATELET
Basophils Absolute: 0.1 10*3/uL (ref 0.0–0.1)
Basophils Relative: 2 %
Eosinophils Absolute: 0.1 10*3/uL (ref 0.0–0.7)
Eosinophils Relative: 2 %
HEMATOCRIT: 38.3 % (ref 36.0–46.0)
HEMOGLOBIN: 13.1 g/dL (ref 12.0–15.0)
LYMPHS ABS: 1.5 10*3/uL (ref 0.7–4.0)
LYMPHS PCT: 36 %
MCH: 30.2 pg (ref 26.0–34.0)
MCHC: 34.2 g/dL (ref 30.0–36.0)
MCV: 88.2 fL (ref 78.0–100.0)
MONOS PCT: 5 %
Monocytes Absolute: 0.2 10*3/uL (ref 0.1–1.0)
NEUTROS PCT: 55 %
Neutro Abs: 2.3 10*3/uL (ref 1.7–7.7)
Platelets: 171 10*3/uL (ref 150–400)
RBC: 4.34 MIL/uL (ref 3.87–5.11)
RDW: 13.3 % (ref 11.5–15.5)
WBC: 4.1 10*3/uL (ref 4.0–10.5)

## 2017-07-06 LAB — ETHANOL

## 2017-07-06 MED ORDER — POTASSIUM CHLORIDE CRYS ER 20 MEQ PO TBCR
40.0000 meq | EXTENDED_RELEASE_TABLET | Freq: Once | ORAL | Status: AC
  Administered 2017-07-06: 40 meq via ORAL
  Filled 2017-07-06: qty 2

## 2017-07-06 NOTE — ED Triage Notes (Signed)
Per EMS-states patient has a history of dementia and symptoms getting worse-states he wants her evaluated for placement

## 2017-07-06 NOTE — ED Provider Notes (Signed)
[facility]-EMERGENCY DEPT Provider Note   CSN: [id number] Arrival date & time: 07/06/17  1411     History   Chief Complaint Chief Complaint  Patient presents with  . nursing home placement    HPI RAGNA KRAMLICH is a 66 y.o. female.  HPI   66yo female with history of htn, hlpd, anxiety, dementia presents with concern for worsening dementia and husband desire for nursing home placement. Has a reports that her dementia has been progressing, and over the last week it has been significantly worse. Reports that today she called him at work crying and stating "help me how many, take me home."  Reports he went home and found her crying on the bed. Reports that her confusion is worsening and he is unable to take care of her as he has to go to work. Reports that he gives her her medications, however last week she was Monday was at work, he got home and found that she poured out all of her medications into the trash, had moved to light that was hanging above the kitchen counter down, and had unplugged and moving the TV. Reports she doesn't remember doing any of these things. He reports she thinks he is her father. He is concerned about leaving her home alone during the day, but feels she has to work. Otherwise denies any acute medical concerns, including no chest pain, shortness of breath, fevers, urinary symptoms, vomiting, diarrhea, changes in medication, numbness, weakness. Reports she has chronic difficulty ambulating which is unchanged.    Past Medical History:  Diagnosis Date  . DDD (degenerative disc disease), cervical   . Dementia   . Depression   . GERD (gastroesophageal reflux disease)   . Hyperlipidemia   . Hypertension   . Kidney stones     Patient Active Problem List   Diagnosis Date Noted  . 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 Orthostatic hypotension 10/30/2015  . Mild dementia 07/29/2015  . Encephalopathy,  metabolic 06/24/2015  . Syncope 06/22/2013  . GERD (gastroesophageal reflux disease)   . Essential hypertension   . Hyperlipidemia   . Depression   . DDD (degenerative disc disease), cervical     Past Surgical History:  Procedure Laterality Date  . kidney stones  1988  . NECK SURGERY  2008   Plate/Screws    OB History    No data available       Home Medications    Prior to Admission medications   Medication Sig Start Date End Date Taking? Authorizing Provider  ALPRAZolam Prudy Feeler) 0.5 MG tablet Take 1 tablet (0.5 mg total) by mouth 2 (two) times daily as needed for anxiety. 11/11/15   Sheliah Hatch, MD  aspirin 81 MG tablet Take 81 mg by mouth daily.    [provider]  atenolol (TENORMIN) 50 MG tablet Take 1 tablet (50 mg total) by mouth daily. 11/02/16   Copland, Gwenlyn Found, MD  buPROPion (WELLBUTRIN SR) 150 MG 12 hr tablet Take 1 tablet (150 mg total) by mouth daily. 11/02/15   Rai, Delene Ruffini, MD  fenofibrate 160 MG tablet Take 1 tablet (160 mg total) by mouth daily. 11/02/16   Copland, Gwenlyn Found, MD  folic acid (FOLVITE) 1 MG tablet Take 1 tablet (1 mg total) by mouth daily. 11/02/15   Rai, Delene Ruffini, MD  lisinopril (PRINIVIL,ZESTRIL) 5 MG tablet TAKE ONE TABLET BY MOUTH ONCE DAILY 11/02/16   Copland, Gwenlyn Found, MD  metoprolol (LOPRESSOR) 50 MG tablet TAKE ONE TABLET BY MOUTH TWICE DAILY 11/17/16   Copland, Gwenlyn Found, MD    Family History Family History  Problem Relation Age of Onset  . Heart attack Mother     Social History Social History  Substance Use Topics  . Smoking status: Former Smoker    Types: Cigarettes  . Smokeless tobacco: Never Used  . Alcohol use No     Allergies   Patient has no known allergies.   Review of Systems Review of Systems  Constitutional: Negative for fever.  HENT: Negative for sore throat.   Eyes: Negative for visual disturbance.  Respiratory: Negative for cough and shortness of breath.   Cardiovascular: Negative for  chest pain.  Gastrointestinal: Negative for abdominal pain, diarrhea, nausea and vomiting.  Genitourinary: Negative for difficulty urinating.  Musculoskeletal: Negative for back pain.  Skin: Negative for rash.  Neurological: Negative for syncope and headaches.  Psychiatric/Behavioral: Positive for confusion. Negative for suicidal ideas.     Physical Exam Updated Vital Signs BP 131/72 (BP Location: Right Arm)   Pulse 60   Temp 97.9 F (36.6 C) (Oral)   Resp 18   Ht 5\' 6"  (1.676 m)   SpO2 97%   Physical Exam  Constitutional: She is oriented to person, place, and time. She appears well-developed and well-nourished. No distress.  HENT:  Head: Normocephalic and atraumatic.  Eyes: Conjunctivae and EOM are normal.  Neck: Normal range of motion.  Cardiovascular: Normal rate, regular rhythm, normal heart sounds and intact distal pulses.  Exam reveals no gallop and no friction rub.   No murmur heard. Pulmonary/Chest: Effort normal and breath sounds normal. No respiratory distress. She has no wheezes. She has no rales.  Abdominal: Soft. She exhibits no distension. There is no tenderness. There is no guarding.  Musculoskeletal: She exhibits no edema or tenderness.  Neurological: She is alert and oriented to person, place, and time. She has normal strength. No cranial nerve deficit or sensory deficit. Coordination normal. GCS eye subscore is 4. GCS verbal subscore is 5. GCS motor subscore is 6.  Reports Tuesday of October or November in 1965 Oriented to self, husband  Skin: Skin is warm and dry. No rash noted. She is not diaphoretic. No erythema.  Nursing note and vitals reviewed.    ED Treatments / Results  Labs (all labs ordered are listed, but only abnormal results are displayed) Labs Reviewed  RAPID URINE DRUG SCREEN, HOSP PERFORMED - Abnormal; Notable for the following:       Result Value   Benzodiazepines POSITIVE (*)    All other components within normal limits  COMPREHENSIVE  METABOLIC PANEL  ETHANOL  CBC WITH DIFFERENTIAL/PLATELET  URINALYSIS, ROUTINE W REFLEX MICROSCOPIC    EKG  EKG Interpretation None       Radiology No results found.  Procedures Procedures (including critical care time)  Medications Ordered in ED Medications - No data to display   Initial Impression / Assessment and Plan / ED Course  I have reviewed the triage vital signs and the nursing notes.  Pertinent labs & imaging results that were available during my care of the patient were reviewed by me and considered in my medical decision making (see chart for details).    66yo female with history of htn, hlpd, anxiety, dementia presents with concern for worsening dementia and husband desire for nursing home placement.  No acute medical concerns at this time. Labs were done and were within normal limits. Mild  hypokalemia and given potassium. Urinalysis shows bacteria but no WBC and negative leukocytes and doubt infection.  No sign of acute medical condition. Consulted social work, case managemetn regarding husband's request and concerns.  Placed home health face to face.  Final Clinical Impressions(s) / ED Diagnoses   Final diagnoses:  Dementia without behavioral disturbance, unspecified dementia type    New Prescriptions New Prescriptions   No medications on file     Alvira MondaySchlossman, Danil Wedge, MD 07/06/17 1916

## 2017-07-06 NOTE — Progress Notes (Addendum)
Consult request has been received. CSW attempting to follow up at present time.  4:47 PM CSW met with Tina Fitzpatrick and her husband. Tina Fitzpatrick has dementia and has been having "episodes" this week where she "dumped all of her medicines out" and re-arranged furniture.  Tina Fitzpatrick has [facility] Medicare which does not pay for ALF Memory Care and Tina Fitzpatrick's husband states he makes too much for Medicaid.  Tina Fitzpatrick was not injured but was brought in for placement by Tina Fitzpatrick's husband and thus, has no skilleable/rehab need at this time.  CM consult was placed by EDP, CSW messaged with CM.  CM aware.  CSW will continue to follow for D/C needs.  Alphonse Guild. Gilford Lardizabal, LCSW, LCAS, CSI Clinical Social Worker Ph: [phone]

## 2017-07-06 NOTE — Progress Notes (Signed)
CSW met with pt and pt's husband and reviewed pt and pt'ds husband's plan to return home with [facility] to be seen by a [facility] Education officer, museum and to visit [employer] [facility] [place] to seek assistance with applying for Medicaid. Pt's husband was apprciative and thanked the CSW.  Please reconsult if future social work needs arise.  CSW signing off, as social work intervention is no longer needed.  Alphonse Guild. Nathanyal Ashmead, LCSW, LCAS, CSI Clinical Social Worker Ph: [phone]

## 2017-07-06 NOTE — Care Management Note (Signed)
Case Management Note  Patient Details  Name: Tina Fitzpatrick MRN: [id number] Date of Birth: Jan 29, 1951  Subjective/Objective:                  66yo female with history of htn, hlpd, anxiety, dementia presents with concern for worsening dementia and husband desire for nursing home placement.  Action/Plan: CM consulted for HHS.  Spoke with pt's spouse, pt was in the restroom.  Spouse reports pt has had falls recently but otherwise gets around and pt has no injuries today.  He states he doesn't feel like he has any options for assistance since he is unable to pay privately for ALF or SNF.  CM advised him to contact legal aid to see if they would be able to help him navigate the system and that the CSW with the HHS agency would also be able to help him work through everything.  Pt's spouse chose [facility].  Contacted Tim who accepted the pt.  CM requested HH agency to assess for DME needs while in the home as CM was unable to get a clear answer of how/when the pt is falling.  No further CM needs noted.  Expected Discharge Date:   07/06/17               Expected Discharge Plan:  Home w Home Health Services  In-House Referral:  Clinical Social Work  Discharge planning Services  CM Consult  Post Acute Care Choice:  Home Health Choice offered to:  Spouse, Patient  HH Arranged:  RN, PT, OT, Nurse's Aide, Social Work [facility]  Status of Service:  Completed, signed off  Tina Fitzpatrick, Lynnae Sandhoffngela N, RN 07/06/2017, 5:12 PM

## 2017-07-06 NOTE — ED Notes (Signed)
Bed: WA27 Expected date:  Expected time:  Means of arrival:  Comments: EMS-nursing home placement

## 2017-07-07 NOTE — Progress Notes (Signed)
[facility] [address], [address] [place], [place] [address] [phone] [phone]  Date:  07/08/2017   Name:  Tina Fitzpatrick   DOB:  02/09/1951   MRN:  [id number]  PCP:  Pearline Cablesopland, Zaeem Kandel C, MD    Chief Complaint: Hospitalization Follow-up (Pt here for hosp f/u visit. Pt would like flu vaccine today. )   History of Present Illness:  Tina Fitzpatrick is a 66 y.o. very pleasant female patient who presents with the following:  Here today for a hospital follow- up visit She was in the ER yesterday due to worsening dementia-   66yo female with history of htn, hlpd, anxiety, dementia presents with concern for worsening dementia and husband desire for nursing home placement.  No acute medical concerns at this time. Labs were done and were within normal limits. Mild hypokalemia and given potassium. Urinalysis shows bacteria but no WBC and negative leukocytes and doubt infection.  No sign of acute medical condition. Consulted social work, case managemetn regarding husband's request and concerns.  Placed home health face to face.  I last saw her in February at which time things were going ok at home  Since then her memory has gotten worse. Her husband is afraid to leave her at home MSW is helping them get her assessed tomorrow for possible memory care.    Flu shot due- will give today Pneumonia vaccine now due- will give today  Her husband reports that she sleeps well, but does not eat that well.  She has started to display some unsafe behaviors at home and he is no longer able to leave her to go to work  BP Readings from Last 3 Encounters:  07/08/17 123/73  07/06/17 126/66  11/02/16 133/64   Her BP is under fine control She denies any pain and is overall feeling well Patient Active Problem List   Diagnosis Date Noted  . 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 Orthostatic hypotension 10/30/2015  . Mild  dementia 07/29/2015  . Encephalopathy, metabolic 06/24/2015  . Syncope 06/22/2013  . GERD (gastroesophageal reflux disease)   . Essential hypertension   . Hyperlipidemia   . Depression   . DDD (degenerative disc disease), cervical     Past Medical History:  Diagnosis Date  . DDD (degenerative disc disease), cervical   . Dementia   . Depression   . GERD (gastroesophageal reflux disease)   . Hyperlipidemia   . Hypertension   . Kidney stones     Past Surgical History:  Procedure Laterality Date  . kidney stones  1988  . NECK SURGERY  2008   Plate/Screws    Social History  Substance Use Topics  . Smoking status: Former Smoker    Types: Cigarettes  . Smokeless tobacco: Never Used  . Alcohol use No    Family History  Problem Relation Age of Onset  . Heart attack Mother     No Known Allergies  Medication list has been reviewed and updated.  Current Outpatient Prescriptions on File Prior to Visit  Medication Sig Dispense Refill  . ALPRAZolam (XANAX) 0.5 MG tablet Take 1 tablet (0.5 mg total) by mouth 2 (two) times daily as needed for anxiety. 60 tablet 3  . aspirin 81 MG tablet Take 81 mg by mouth daily.    Marland Kitchen. atenolol (TENORMIN) 50 MG tablet Take 1 tablet (50 mg total) by mouth daily. 90 tablet 3  .  fenofibrate 160 MG tablet Take 1 tablet (160 mg total) by mouth daily. 90 tablet 3  . folic acid (FOLVITE) 1 MG tablet Take 1 tablet (1 mg total) by mouth daily. 30 tablet 4  . lisinopril (PRINIVIL,ZESTRIL) 5 MG tablet TAKE ONE TABLET BY MOUTH ONCE DAILY 90 tablet 3  . metoprolol (LOPRESSOR) 50 MG tablet TAKE ONE TABLET BY MOUTH TWICE DAILY 180 tablet 3   No current facility-administered medications on file prior to visit.     Review of Systems:  As per HPI- otherwise negative.   Physical Examination: Vitals:   07/08/17 1349  BP: 123/73  Pulse: (!) 56  Temp: 97.6 F (36.4 C)  SpO2: 100%   Vitals:   07/08/17 1349  Weight: 150 lb 12.8 oz (68.4 kg)  Height:  5\' 3"  (1.6 m)   Body mass index is 26.71 kg/m. Ideal Body Weight: Weight in (lb) to have BMI = 25: 140.8  GEN: WDWN, NAD, Non-toxic, A & O x 3, mild overweight, looks well HEENT: Atraumatic, Normocephalic. Neck supple. No masses, No LAD. Ears and Nose: No external deformity. CV: RRR, No M/G/R. No JVD. No thrill. No extra heart sounds. PULM: CTA B, no wheezes, crackles, rhonchi. No retractions. No resp. distress. No accessory muscle use. ABD: S, NT, ND EXTR: No c/c/e NEURO Normal gait.  PSYCH: Normally interactive. Conversant. Not depressed or anxious appearing.  Calm demeanor.    Assessment and Plan: Hypokalemia - Plan: Basic metabolic panel  Immunization due - Plan: Pneumococcal conjugate vaccine 13-valent, Flu vaccine HIGH DOSE PF (Fluzone High dose), CANCELED: Flu Vaccine QUAD 36+ mos IM (Fluarix & Fluzone Quad PF  Screening for tuberculosis - Plan: Quantiferon tb gold assay  Altered mental status, unspecified altered mental status type - Plan: Urine Culture  She had a UA but no culture in the ER- I will check a culture today flu and pneumonia vaccine today TB screening in anticipation of placement Repeat K for mild hypokalemia  Signed Abbe Amsterdam, MD

## 2017-07-08 ENCOUNTER — Ambulatory Visit (INDEPENDENT_AMBULATORY_CARE_PROVIDER_SITE_OTHER): Payer: Medicare Other | Admitting: Family Medicine

## 2017-07-08 ENCOUNTER — Telehealth: Payer: Self-pay | Admitting: Family Medicine

## 2017-07-08 VITALS — BP 123/73 | HR 56 | Temp 97.6°F | Ht 63.0 in | Wt 150.8 lb

## 2017-07-08 DIAGNOSIS — Z23 Encounter for immunization: Secondary | ICD-10-CM

## 2017-07-08 DIAGNOSIS — R4182 Altered mental status, unspecified: Secondary | ICD-10-CM

## 2017-07-08 DIAGNOSIS — E876 Hypokalemia: Secondary | ICD-10-CM | POA: Diagnosis not present

## 2017-07-08 DIAGNOSIS — Z111 Encounter for screening for respiratory tuberculosis: Secondary | ICD-10-CM

## 2017-07-08 NOTE — Patient Instructions (Signed)
It was very nice to see you today- take care and I will be in touch with your metabolic profile and TB screening If you need any paperwork filled out for memory care please let me know

## 2017-07-08 NOTE — Telephone Encounter (Signed)
Matthias HughsKecia - [facility] nurse - [phone]  Called in to make provider know that they will be going out to the home to visit pt tomorrow. She said that they are required to update provider if they are not able to visit with pt within 24-48 hours of receiving orders   Orders were received on 07/06/17

## 2017-07-09 DIAGNOSIS — M5030 Other cervical disc degeneration, unspecified cervical region: Secondary | ICD-10-CM | POA: Diagnosis not present

## 2017-07-09 DIAGNOSIS — Z87891 Personal history of nicotine dependence: Secondary | ICD-10-CM | POA: Diagnosis not present

## 2017-07-09 DIAGNOSIS — I10 Essential (primary) hypertension: Secondary | ICD-10-CM | POA: Diagnosis not present

## 2017-07-09 DIAGNOSIS — Z9181 History of falling: Secondary | ICD-10-CM | POA: Diagnosis not present

## 2017-07-09 LAB — URINE CULTURE
MICRO NUMBER:: 81196719
SPECIMEN QUALITY:: ADEQUATE

## 2017-07-09 LAB — BASIC METABOLIC PANEL
BUN: 10 mg/dL (ref 6–23)
CHLORIDE: 109 meq/L (ref 96–112)
CO2: 28 meq/L (ref 19–32)
Calcium: 10 mg/dL (ref 8.4–10.5)
Creatinine, Ser: 0.83 mg/dL (ref 0.40–1.20)
GFR: 73.32 mL/min (ref >60.00)
GLUCOSE: 99 mg/dL (ref 70–99)
POTASSIUM: 4.4 meq/L (ref 3.5–5.1)
SODIUM: 143 meq/L (ref 135–145)

## 2017-07-10 LAB — QUANTIFERON TB GOLD ASSAY (BLOOD)
Mitogen-Nil: >10 IU/mL
QUANTIFERON(R)-TB GOLD: NEGATIVE
Quantiferon Nil Value: 0.07 IU/mL
Quantiferon Tb Ag Minus Nil Value: <0 IU/mL

## 2017-07-11 ENCOUNTER — Encounter: Payer: Self-pay | Admitting: Family Medicine

## 2017-07-12 ENCOUNTER — Telehealth: Payer: Self-pay | Admitting: Family Medicine

## 2017-07-12 NOTE — Telephone Encounter (Signed)
Tina Fitzpatrick at [facility] [phone] called for verbal orders. She saw pt Friday 07/09/17. Educational dementia with spouse two time a week for one week then one time a week for two weeks.

## 2017-07-13 ENCOUNTER — Telehealth: Payer: Self-pay | Admitting: Family Medicine

## 2017-07-13 DIAGNOSIS — Z9181 History of falling: Secondary | ICD-10-CM | POA: Diagnosis not present

## 2017-07-13 DIAGNOSIS — M5030 Other cervical disc degeneration, unspecified cervical region: Secondary | ICD-10-CM | POA: Diagnosis not present

## 2017-07-13 DIAGNOSIS — Z87891 Personal history of nicotine dependence: Secondary | ICD-10-CM | POA: Diagnosis not present

## 2017-07-13 DIAGNOSIS — I10 Essential (primary) hypertension: Secondary | ICD-10-CM | POA: Diagnosis not present

## 2017-07-13 NOTE — Telephone Encounter (Signed)
Tried to return call to give verbal orders , no answer.

## 2017-07-13 NOTE — Telephone Encounter (Signed)
Copied from CRM #[id number]. Topic: General - Other >> Jul 13, 2017  4:25 PM Bell, Tiffany M wrote: Caller name: Scott  Relation to pt: RN [facility]  Call back number: [phone] Pharmacy:  Reason for call:  Wanted to inform PCP Low B/P 118/56 standing 110/60 pulse 46 to 54 

## 2017-07-14 ENCOUNTER — Emergency Department (HOSPITAL_COMMUNITY)
Admission: EM | Admit: 2017-07-14 | Discharge: 2017-07-14 | Disposition: A | Discharge status: Home / Self Care | Payer: Medicare Other | Attending: Emergency Medicine | Admitting: Emergency Medicine

## 2017-07-14 ENCOUNTER — Telehealth: Payer: Self-pay | Admitting: Family Medicine

## 2017-07-14 DIAGNOSIS — Z7982 Long term (current) use of aspirin: Secondary | ICD-10-CM | POA: Insufficient documentation

## 2017-07-14 DIAGNOSIS — F0390 Unspecified dementia without behavioral disturbance: Secondary | ICD-10-CM | POA: Insufficient documentation

## 2017-07-14 DIAGNOSIS — R000 Tachycardia, unspecified: Secondary | ICD-10-CM | POA: Diagnosis not present

## 2017-07-14 DIAGNOSIS — Z79899 Other long term (current) drug therapy: Secondary | ICD-10-CM | POA: Diagnosis not present

## 2017-07-14 DIAGNOSIS — Z87891 Personal history of nicotine dependence: Secondary | ICD-10-CM | POA: Insufficient documentation

## 2017-07-14 DIAGNOSIS — I10 Essential (primary) hypertension: Secondary | ICD-10-CM | POA: Insufficient documentation

## 2017-07-14 DIAGNOSIS — R450 Nervousness: Secondary | ICD-10-CM | POA: Diagnosis not present

## 2017-07-14 DIAGNOSIS — R110 Nausea: Secondary | ICD-10-CM | POA: Insufficient documentation

## 2017-07-14 LAB — CBC WITH DIFFERENTIAL/PLATELET
BASOS PCT: 2 %
Basophils Absolute: 0.1 10*3/uL (ref 0.0–0.1)
EOS ABS: 0.1 10*3/uL (ref 0.0–0.7)
EOS PCT: 1 %
HEMATOCRIT: 36.5 % (ref 36.0–46.0)
Hemoglobin: 12.6 g/dL (ref 12.0–15.0)
Lymphocytes Relative: 32 %
Lymphs Abs: 1.4 10*3/uL (ref 0.7–4.0)
MCH: 30.6 pg (ref 26.0–34.0)
MCHC: 34.5 g/dL (ref 30.0–36.0)
MCV: 88.6 fL (ref 78.0–100.0)
MONO ABS: 0.3 10*3/uL (ref 0.1–1.0)
MONOS PCT: 6 %
Neutro Abs: 2.5 10*3/uL (ref 1.7–7.7)
Neutrophils Relative %: 59 %
Platelets: 153 10*3/uL (ref 150–400)
RBC: 4.12 MIL/uL (ref 3.87–5.11)
RDW: 13.2 % (ref 11.5–15.5)
WBC: 4.3 10*3/uL (ref 4.0–10.5)

## 2017-07-14 LAB — BASIC METABOLIC PANEL
Anion gap: 9 (ref 5–15)
BUN: 13 mg/dL (ref 6–20)
CALCIUM: 9.3 mg/dL (ref 8.9–10.3)
CO2: 22 mmol/L (ref 22–32)
CREATININE: 0.76 mg/dL (ref 0.44–1.00)
Chloride: 112 mmol/L — ABNORMAL HIGH (ref 101–111)
GFR calc Af Amer: >60 mL/min (ref >60)
GFR calc non Af Amer: >60 mL/min (ref >60)
GLUCOSE: 133 mg/dL — AB (ref 65–99)
Potassium: 3 mmol/L — ABNORMAL LOW (ref 3.5–5.1)
Sodium: 143 mmol/L (ref 135–145)

## 2017-07-14 LAB — I-STAT TROPONIN, ED: TROPONIN I, POC: 0 ng/mL (ref 0.00–0.08)

## 2017-07-14 LAB — HEPATIC FUNCTION PANEL
ALBUMIN: 4.1 g/dL (ref 3.5–5.0)
ALT: 21 U/L (ref 14–54)
AST: 37 U/L (ref 15–41)
Alkaline Phosphatase: 42 U/L (ref 38–126)
BILIRUBIN TOTAL: 0.9 mg/dL (ref 0.3–1.2)
Bilirubin, Direct: 0.2 mg/dL (ref 0.1–0.5)
Indirect Bilirubin: 0.7 mg/dL (ref 0.3–0.9)
Total Protein: 6.8 g/dL (ref 6.5–8.1)

## 2017-07-14 LAB — LIPASE, BLOOD: LIPASE: 25 U/L (ref 11–51)

## 2017-07-14 MED ORDER — POTASSIUM CHLORIDE CRYS ER 20 MEQ PO TBCR
40.0000 meq | EXTENDED_RELEASE_TABLET | Freq: Once | ORAL | Status: AC
  Administered 2017-07-14: 40 meq via ORAL
  Filled 2017-07-14: qty 2

## 2017-07-14 MED ORDER — POTASSIUM CHLORIDE CRYS ER 20 MEQ PO TBCR: 40.0000 meq | EXTENDED_RELEASE_TABLET | Freq: Every day | ORAL | 0 refills | Status: DC

## 2017-07-14 MED ORDER — ONDANSETRON HCL 4 MG PO TABS: 4.0000 mg | ORAL_TABLET | Freq: Three times a day (TID) | ORAL | 0 refills | Status: DC | PRN

## 2017-07-14 NOTE — Telephone Encounter (Signed)
Returned called, no answer. Left voicemail for verbal authorization.

## 2017-07-14 NOTE — Telephone Encounter (Signed)
Caller name: Gar Gibbonnne Marie  Relation to pt: Social Worker from [facility]  Call back number: [phone]    Reason for call:   Social Worker requesting verbal orders to visit patient at home to help with memory care. Social worker would like to know if patient has specifc dementia dx, please advise

## 2017-07-14 NOTE — ED Triage Notes (Signed)
Per EMS: Pt is coming from home. Pt has a hx of dementia. She is alert to self only.  Pt is very anxious.  Pt states she feels like "Her head is all over the place". Pt denies dizziness.  The dementia dx was recent and the husband is in the process of trying to get a home health nurse. CBG 149 BP 126/74 HR 70 RR 16 100% SPO2 on RA

## 2017-07-14 NOTE — ED Provider Notes (Signed)
[facility]-EMERGENCY DEPT Provider Note   CSN: [id number] Arrival date & time: 07/14/17  1449     History   Chief Complaint Chief Complaint  Patient presents with  . Nausea    HPI Tina Fitzpatrick is a 66 y.o. female with a past medical history of hypertension, hyperlipidemia, GERD, dementia, who presents to ED for evaluation of 1 day history of nausea. She states she woke up this morning and felt like she was going to throw up. She states that the sensation has been going on all day but denies any currently. She denies any pain. She denies any vomiting, abdominal pain, medication changes, falls, numbness in legs, vision changes, headache, chest pain, trouble breathing, urinary issues, bowel changes.  HPI  Past Medical History:  Diagnosis Date  . DDD (degenerative disc disease), cervical   . Dementia   . Depression   . GERD (gastroesophageal reflux disease)   . Hyperlipidemia   . Hypertension   . Kidney stones     Patient Active Problem List   Diagnosis Date Noted  . 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 Orthostatic hypotension 10/30/2015  . Mild dementia 07/29/2015  . Encephalopathy, metabolic 06/24/2015  . Syncope 06/22/2013  . GERD (gastroesophageal reflux disease)   . Essential hypertension   . Hyperlipidemia   . Depression   . DDD (degenerative disc disease), cervical     Past Surgical History:  Procedure Laterality Date  . kidney stones  1988  . NECK SURGERY  2008   Plate/Screws    OB History    No data available       Home Medications    Prior to Admission medications   Medication Sig Start Date End Date Taking? Authorizing Provider  ALPRAZolam Prudy Feeler(XANAX) 0.5 MG tablet Take 1 tablet (0.5 mg total) by mouth 2 (two) times daily as needed for anxiety. 11/11/15   Sheliah Hatchabori, Katherine E, MD  aspirin 81 MG tablet Take 81 mg by mouth daily.    [provider]  atenolol (TENORMIN) 50 MG tablet  Take 1 tablet (50 mg total) by mouth daily. 11/02/16   Copland, Gwenlyn FoundJessica C, MD  fenofibrate 160 MG tablet Take 1 tablet (160 mg total) by mouth daily. 11/02/16   Copland, Gwenlyn FoundJessica C, MD  folic acid (FOLVITE) 1 MG tablet Take 1 tablet (1 mg total) by mouth daily. 11/02/15   Rai, Ripudeep K, MD  gabapentin (NEURONTIN) 100 MG capsule TAKE 1 CAPSULE BY MOUTH THREE TIMES DAILY 04/13/17   [provider]  lisinopril (PRINIVIL,ZESTRIL) 5 MG tablet TAKE ONE TABLET BY MOUTH ONCE DAILY 11/02/16   Copland, Gwenlyn FoundJessica C, MD  metoprolol (LOPRESSOR) 50 MG tablet TAKE ONE TABLET BY MOUTH TWICE DAILY 11/17/16   Copland, Gwenlyn FoundJessica C, MD  ondansetron (ZOFRAN) 4 MG tablet Take 1 tablet (4 mg total) by mouth every 8 (eight) hours as needed for nausea or vomiting. 07/14/17   Zariah Cavendish, PA-C  potassium chloride SA (K-DUR,KLOR-CON) 20 MEQ tablet Take 2 tablets (40 mEq total) by mouth daily. 07/14/17 07/17/17  Dietrich PatesKhatri, Ourania Hamler, PA-C    Family History Family History  Problem Relation Age of Onset  . Heart attack Mother     Social History Social History  Substance Use Topics  . Smoking status: Former Smoker    Types: Cigarettes  . Smokeless tobacco: Never Used  . Alcohol use No     Allergies   Patient has no known allergies.   Review of Systems  Review of Systems  Constitutional: Negative for appetite change, chills and fever.  HENT: Negative for ear pain, rhinorrhea, sneezing and sore throat.   Eyes: Negative for photophobia and visual disturbance.  Respiratory: Negative for cough, chest tightness, shortness of breath and wheezing.   Cardiovascular: Negative for chest pain and palpitations.  Gastrointestinal: Positive for nausea. Negative for abdominal pain, blood in stool, constipation, diarrhea and vomiting.  Genitourinary: Negative for dysuria, hematuria and urgency.  Musculoskeletal: Negative for myalgias.  Skin: Negative for rash.  Neurological: Negative for dizziness, weakness and light-headedness.      Physical Exam Updated Vital Signs BP 131/80 (BP Location: Right Arm)   Pulse 74   Temp 98.2 F (36.8 C) (Oral)   Resp 16   Ht 5\' 3"  (1.6 m)   Wt 68 kg (150 lb)   SpO2 99%   BMI 26.57 kg/m   Physical Exam  Constitutional: She appears well-developed and well-nourished. No distress.  HENT:  Head: Normocephalic and atraumatic.  Nose: Nose normal.  Eyes: Conjunctivae and EOM are normal. Right eye exhibits no discharge. Left eye exhibits no discharge. No scleral icterus.  Neck: Normal range of motion. Neck supple.  Cardiovascular: Normal rate, regular rhythm, normal heart sounds and intact distal pulses.  Exam reveals no gallop and no friction rub.   No murmur heard. Pulmonary/Chest: Effort normal and breath sounds normal. No respiratory distress.  Abdominal: Soft. Bowel sounds are normal. She exhibits no distension. There is no tenderness. There is no guarding.  Musculoskeletal: Normal range of motion. She exhibits no edema.  Neurological: She is alert. No cranial nerve deficit or sensory deficit. She exhibits normal muscle tone. Coordination normal.  She is able to recall her own name and her husband's name. She is able to state it is Wednesday, October 2018. She states she is at [facility] in [place]. Unable to recall that today is Halloween.  Skin: Skin is warm and dry. No rash noted.  Psychiatric: She has a normal mood and affect.  Nursing note and vitals reviewed.    ED Treatments / Results  Labs (all labs ordered are listed, but only abnormal results are displayed) Labs Reviewed  BASIC METABOLIC PANEL - Abnormal; Notable for the following:       Result Value   Potassium 3.0 (*)    Chloride 112 (*)    Glucose, Bld 133 (*)    All other components within normal limits  CBC WITH DIFFERENTIAL/PLATELET  HEPATIC FUNCTION PANEL  LIPASE, BLOOD  I-STAT TROPONIN, ED    EKG  EKG Interpretation  Date/Time:  Wednesday July 14 2017 16:49:10  EDT Ventricular Rate:  59 PR Interval:  168 QRS Duration: 94 QT Interval:  406 QTC Calculation: 401 R Axis:   1 Text Interpretation:  Sinus bradycardia Abnormal ECG TW depressions in inferolateral leads, previously noted in 10/30/15 but more evident today No ST elevations Confirmed by Shaune Pollack [phone]) on 07/14/2017 4:54:04 PM       Radiology No results found.  Procedures Procedures (including critical care time)  Medications Ordered in ED Medications  potassium chloride SA (K-DUR,KLOR-CON) CR tablet 40 mEq (not administered)     Initial Impression / Assessment and Plan / ED Course  I have reviewed the triage vital signs and the nursing notes.  Pertinent labs & imaging results that were available during my care of the patient were reviewed by me and considered in my medical decision making (see chart for details).     Patient  presents to ED for evaluation of nausea for the past day.  She does have a history of dementia.  She denied any other complaints including no abdominal pain, chest pain or shortness of breath.  Her physical exam is unremarkable.  She has no focal deficits on neurological exam.  Lab work including BMP, CBC, LFTs, lipase unremarkable.  Hypokalemia which patient has a history of. EKG did show new T wave inversions however, troponin was negative.  She continues to deny any complaints and states that she is ready to go home.  She states that she is not currently nauseous.  Potassium repleted orally here. Will dispo home with potassium oral and Zofran. Patient is in process of obtaining home health aide.  Patient discussed with and seen by Dr. Erma Heritage.  Final Clinical Impressions(s) / ED Diagnoses   Final diagnoses:  Nausea    New Prescriptions New Prescriptions   ONDANSETRON (ZOFRAN) 4 MG TABLET    Take 1 tablet (4 mg total) by mouth every 8 (eight) hours as needed for nausea or vomiting.   POTASSIUM CHLORIDE SA (K-DUR,KLOR-CON) 20 MEQ TABLET    Take 2  tablets (40 mEq total) by mouth daily.     Dietrich Pates, PA-C 07/14/17 1743    Shaune Pollack, MD 07/15/17 1149

## 2017-07-14 NOTE — Telephone Encounter (Signed)
Called Tina GibbonAnne Fitzpatrick [name]

## 2017-07-14 NOTE — ED Notes (Signed)
Bed: [facility] Expected date:  Expected time:  Means of arrival:  Comments: 

## 2017-07-14 NOTE — Discharge Instructions (Signed)
Please read attached information regarding your condition. Your labwork today was normal. Your heart enzymes were normal. Take Zofran as needed for nausea. Take potassium once daily for 3 days. Follow-up with your primary care provider for further evaluation. Return to ED for chest pain, trouble breathing, falls, headaches, vision changes.

## 2017-07-14 NOTE — ED Notes (Signed)
ED Provider at bedside. 

## 2017-07-14 NOTE — Telephone Encounter (Signed)
Caller name: Mark Bias-KindBaldwin Crownred Relation to pt: Call back [phone] Pharmacy:  Reason for call: pt is needing verbal order for home health PT 2x a week for 6 weeks, starting on 07/13/2017.

## 2017-07-14 NOTE — Telephone Encounter (Signed)
BP Readings from Last 3 Encounters:  07/14/17 131/80  07/08/17 123/73  07/06/17 126/66   Pulse Readings from Last 3 Encounters:  07/14/17 74  07/08/17 (!) 56  07/06/17 90   Called Scott back- yesterday he was getting quite low BP and pulse readings- however she said she felt fine .  She seems to be at the ER right now- I cannot tell why as of yet- and her BP and pulse are normal.  Gave my cell to [place] in case he needs me again in the future

## 2017-07-16 ENCOUNTER — Other Ambulatory Visit: Payer: Self-pay

## 2017-07-16 ENCOUNTER — Telehealth: Payer: Self-pay | Admitting: Family Medicine

## 2017-07-16 DIAGNOSIS — Z87891 Personal history of nicotine dependence: Secondary | ICD-10-CM | POA: Diagnosis not present

## 2017-07-16 DIAGNOSIS — I10 Essential (primary) hypertension: Secondary | ICD-10-CM | POA: Diagnosis not present

## 2017-07-16 DIAGNOSIS — Z9181 History of falling: Secondary | ICD-10-CM | POA: Diagnosis not present

## 2017-07-16 DIAGNOSIS — M5030 Other cervical disc degeneration, unspecified cervical region: Secondary | ICD-10-CM | POA: Diagnosis not present

## 2017-07-16 NOTE — Patient Outreach (Signed)
Outreach [facility]  I did not speak with patient she could not come to the phone at that time.  I left a message with the gentleman that answered the phone asking for her to call me back.  Unsuccessful letter, magnet and know before you go mailed 07/16/17.

## 2017-07-16 NOTE — Telephone Encounter (Addendum)
Caller name: Sane,Jeffrey F Relation to pt: spouse  Call back [phone]  Reason for call:  Spouse requested PCP to [facility] and [facility] [address] [place] [phone] (fax)

## 2017-07-19 DIAGNOSIS — M5030 Other cervical disc degeneration, unspecified cervical region: Secondary | ICD-10-CM | POA: Diagnosis not present

## 2017-07-19 DIAGNOSIS — Z9181 History of falling: Secondary | ICD-10-CM | POA: Diagnosis not present

## 2017-07-19 DIAGNOSIS — I10 Essential (primary) hypertension: Secondary | ICD-10-CM | POA: Diagnosis not present

## 2017-07-19 DIAGNOSIS — Z87891 Personal history of nicotine dependence: Secondary | ICD-10-CM | POA: Diagnosis not present

## 2017-07-21 DIAGNOSIS — M5030 Other cervical disc degeneration, unspecified cervical region: Secondary | ICD-10-CM | POA: Diagnosis not present

## 2017-07-21 DIAGNOSIS — Z9181 History of falling: Secondary | ICD-10-CM | POA: Diagnosis not present

## 2017-07-21 DIAGNOSIS — Z87891 Personal history of nicotine dependence: Secondary | ICD-10-CM | POA: Diagnosis not present

## 2017-07-21 DIAGNOSIS — I10 Essential (primary) hypertension: Secondary | ICD-10-CM | POA: Diagnosis not present

## 2017-07-22 NOTE — Telephone Encounter (Signed)
Called spouse and asked that he have [facility] and we will complete the form and attach TB screening results as requested. Spouse verbalized understanding and will have [facility] fax form.

## 2017-07-23 ENCOUNTER — Telehealth: Payer: Self-pay | Admitting: Family Medicine

## 2017-07-23 DIAGNOSIS — Z87891 Personal history of nicotine dependence: Secondary | ICD-10-CM | POA: Diagnosis not present

## 2017-07-23 DIAGNOSIS — Z9181 History of falling: Secondary | ICD-10-CM | POA: Diagnosis not present

## 2017-07-23 DIAGNOSIS — M5030 Other cervical disc degeneration, unspecified cervical region: Secondary | ICD-10-CM | POA: Diagnosis not present

## 2017-07-23 DIAGNOSIS — I10 Essential (primary) hypertension: Secondary | ICD-10-CM | POA: Diagnosis not present

## 2017-07-23 NOTE — Telephone Encounter (Signed)
Caller name: Vinie Sillnn Marie Trammell Relation to pt: [facility] at [facility] Call back number: [phone] Pharmacy:  Reason for call: Ruffin FrederickAnn M (Child psychotherapistsocial worker) stated that the family members is wanting to put the [facility] and the husband as contact them to send FL2 form, social worker wants to know, did we receive the FL2 form, (social worker was still going to send the form today by fax to our office. When receive fax and form filled out please fax document to fax num: [phone] attention to [facility] admission coordinator. Please advise.

## 2017-07-26 NOTE — Telephone Encounter (Signed)
Conversation: [facility]  (Newest Message First)  July 26, 2017  Copland, Gwenlyn FoundJessica C, MD  8:32 AM  Note    Received form and signed today, gave to [facility]    July 22, 2017  Cammy Copahandler, Tanesha N, CMA   8:40 AM  Note    Called spouse and asked that he have [facility] fax the [facility] form to the office and we will complete the form and attach TB screening results as requested. Spouse verbalized understanding and will have [facility] fax form.

## 2017-07-26 NOTE — Telephone Encounter (Signed)
Received form and signed today, gave to [facility]

## 2017-07-27 ENCOUNTER — Telehealth: Payer: Self-pay | Admitting: Family Medicine

## 2017-07-27 DIAGNOSIS — I10 Essential (primary) hypertension: Secondary | ICD-10-CM | POA: Diagnosis not present

## 2017-07-27 DIAGNOSIS — Z9181 History of falling: Secondary | ICD-10-CM | POA: Diagnosis not present

## 2017-07-27 DIAGNOSIS — M5030 Other cervical disc degeneration, unspecified cervical region: Secondary | ICD-10-CM | POA: Diagnosis not present

## 2017-07-27 DIAGNOSIS — Z87891 Personal history of nicotine dependence: Secondary | ICD-10-CM | POA: Diagnosis not present

## 2017-07-27 NOTE — Telephone Encounter (Signed)
Caller name: Tina Fitzpatrick Relation to pt: Social [facility] Call back number: [phone] Pharmacy:  Reason for call: Social Worker wanted to inform provider that she is still assisting pt for placement in a facility, [facility] has declined to admit pt, so Child psychotherapistsocial worker is now persueing with Memory Care facility, social worker is going to fax an [facility] form for provider to have it signed, once it is completely document can be Fax to [phone] attention to Maryruth HancockAnn Marie (Child psychotherapistocial Worker). So that they can be able to fine her a memory care unit. (Document will be sent today 07-27-2017- FL2).

## 2017-07-28 DIAGNOSIS — M5030 Other cervical disc degeneration, unspecified cervical region: Secondary | ICD-10-CM | POA: Diagnosis not present

## 2017-07-28 DIAGNOSIS — Z9181 History of falling: Secondary | ICD-10-CM | POA: Diagnosis not present

## 2017-07-28 DIAGNOSIS — I10 Essential (primary) hypertension: Secondary | ICD-10-CM | POA: Diagnosis not present

## 2017-07-28 DIAGNOSIS — Z87891 Personal history of nicotine dependence: Secondary | ICD-10-CM | POA: Diagnosis not present

## 2017-07-28 NOTE — Telephone Encounter (Signed)
New FL2 paperwork for Memory Care forwarded to provider/SLS 11/14

## 2017-07-28 NOTE — Telephone Encounter (Signed)
Taken care of, returned to Safeway IncSharon

## 2017-07-29 ENCOUNTER — Telehealth: Payer: Self-pay | Admitting: Family Medicine

## 2017-07-29 DIAGNOSIS — M5030 Other cervical disc degeneration, unspecified cervical region: Secondary | ICD-10-CM | POA: Diagnosis not present

## 2017-07-29 DIAGNOSIS — Z9181 History of falling: Secondary | ICD-10-CM | POA: Diagnosis not present

## 2017-07-29 DIAGNOSIS — Z87891 Personal history of nicotine dependence: Secondary | ICD-10-CM | POA: Diagnosis not present

## 2017-07-29 DIAGNOSIS — I10 Essential (primary) hypertension: Secondary | ICD-10-CM | POA: Diagnosis not present

## 2017-07-29 NOTE — Telephone Encounter (Signed)
Ann Marquis BuggyMarie Plaskett - Social Worker from [facility] called to vef if fax was recd that was fax over on Tuesday 07/27/2017, She fax over a FL2 -Memory Care Assistant Living Form and wants to know when it will be completed and return.   [phone]  Fax# [phone]

## 2017-07-30 NOTE — Telephone Encounter (Signed)
Called Maryruth Hancocknn Marie back- all is taken of of

## 2017-08-03 DIAGNOSIS — M5030 Other cervical disc degeneration, unspecified cervical region: Secondary | ICD-10-CM | POA: Diagnosis not present

## 2017-08-03 DIAGNOSIS — Z9181 History of falling: Secondary | ICD-10-CM | POA: Diagnosis not present

## 2017-08-03 DIAGNOSIS — Z87891 Personal history of nicotine dependence: Secondary | ICD-10-CM | POA: Diagnosis not present

## 2017-08-03 DIAGNOSIS — I10 Essential (primary) hypertension: Secondary | ICD-10-CM | POA: Diagnosis not present

## 2017-08-06 DIAGNOSIS — M5030 Other cervical disc degeneration, unspecified cervical region: Secondary | ICD-10-CM | POA: Diagnosis not present

## 2017-08-06 DIAGNOSIS — Z9181 History of falling: Secondary | ICD-10-CM | POA: Diagnosis not present

## 2017-08-06 DIAGNOSIS — Z87891 Personal history of nicotine dependence: Secondary | ICD-10-CM | POA: Diagnosis not present

## 2017-08-06 DIAGNOSIS — I10 Essential (primary) hypertension: Secondary | ICD-10-CM | POA: Diagnosis not present

## 2017-08-10 DIAGNOSIS — Z9181 History of falling: Secondary | ICD-10-CM | POA: Diagnosis not present

## 2017-08-10 DIAGNOSIS — Z87891 Personal history of nicotine dependence: Secondary | ICD-10-CM | POA: Diagnosis not present

## 2017-08-10 DIAGNOSIS — I10 Essential (primary) hypertension: Secondary | ICD-10-CM | POA: Diagnosis not present

## 2017-08-10 DIAGNOSIS — M5030 Other cervical disc degeneration, unspecified cervical region: Secondary | ICD-10-CM | POA: Diagnosis not present

## 2017-08-12 ENCOUNTER — Telehealth: Payer: Self-pay | Admitting: Emergency Medicine

## 2017-08-12 DIAGNOSIS — Z87891 Personal history of nicotine dependence: Secondary | ICD-10-CM | POA: Diagnosis not present

## 2017-08-12 DIAGNOSIS — Z9181 History of falling: Secondary | ICD-10-CM | POA: Diagnosis not present

## 2017-08-12 DIAGNOSIS — I10 Essential (primary) hypertension: Secondary | ICD-10-CM | POA: Diagnosis not present

## 2017-08-12 DIAGNOSIS — M5030 Other cervical disc degeneration, unspecified cervical region: Secondary | ICD-10-CM | POA: Diagnosis not present

## 2017-08-12 NOTE — Telephone Encounter (Signed)
Copied from CRM #[id number]. Topic: General - Other >> Jul 13, 2017  4:25 PM Elliot GaultBell, Tiffany M wrote: Jethro Bolusaller name: Tina Fitzpatrick  Relation to pt: RN [facility]  Call back number: [phone] Pharmacy:  Reason for call:  Wanted to inform PCP Low B/P 118/56 standing 110/60 pulse 46 to 54

## 2017-08-12 NOTE — Telephone Encounter (Signed)
Called and LMOM at her home- I am not sure if she is on metopolol or atenolol In either case, please cut this dose in half.  I will try them back tomorrow

## 2017-08-13 NOTE — Telephone Encounter (Signed)
Called and spoke to her husband- she is taking metoprolol, not atenolol.  They have decreased her dose to 25 mg BID.  She seems to be feeling fine.  Home heath will see her next week.  Also called Scott from [facility] and gave him this update

## 2017-08-17 DIAGNOSIS — M5030 Other cervical disc degeneration, unspecified cervical region: Secondary | ICD-10-CM | POA: Diagnosis not present

## 2017-08-17 DIAGNOSIS — Z9181 History of falling: Secondary | ICD-10-CM | POA: Diagnosis not present

## 2017-08-17 DIAGNOSIS — I10 Essential (primary) hypertension: Secondary | ICD-10-CM | POA: Diagnosis not present

## 2017-08-17 DIAGNOSIS — Z87891 Personal history of nicotine dependence: Secondary | ICD-10-CM | POA: Diagnosis not present

## 2017-08-19 DIAGNOSIS — I10 Essential (primary) hypertension: Secondary | ICD-10-CM | POA: Diagnosis not present

## 2017-08-19 DIAGNOSIS — Z87891 Personal history of nicotine dependence: Secondary | ICD-10-CM | POA: Diagnosis not present

## 2017-08-19 DIAGNOSIS — Z9181 History of falling: Secondary | ICD-10-CM | POA: Diagnosis not present

## 2017-08-19 DIAGNOSIS — M5030 Other cervical disc degeneration, unspecified cervical region: Secondary | ICD-10-CM | POA: Diagnosis not present

## 2017-08-24 DIAGNOSIS — Z9181 History of falling: Secondary | ICD-10-CM | POA: Diagnosis not present

## 2017-08-24 DIAGNOSIS — Z87891 Personal history of nicotine dependence: Secondary | ICD-10-CM | POA: Diagnosis not present

## 2017-08-24 DIAGNOSIS — I10 Essential (primary) hypertension: Secondary | ICD-10-CM | POA: Diagnosis not present

## 2017-08-24 DIAGNOSIS — M5030 Other cervical disc degeneration, unspecified cervical region: Secondary | ICD-10-CM | POA: Diagnosis not present

## 2017-08-27 DIAGNOSIS — Z87891 Personal history of nicotine dependence: Secondary | ICD-10-CM | POA: Diagnosis not present

## 2017-08-27 DIAGNOSIS — M5030 Other cervical disc degeneration, unspecified cervical region: Secondary | ICD-10-CM | POA: Diagnosis not present

## 2017-08-27 DIAGNOSIS — Z9181 History of falling: Secondary | ICD-10-CM | POA: Diagnosis not present

## 2017-08-27 DIAGNOSIS — I10 Essential (primary) hypertension: Secondary | ICD-10-CM | POA: Diagnosis not present

## 2017-09-02 ENCOUNTER — Telehealth: Payer: Self-pay | Admitting: *Deleted

## 2017-09-02 NOTE — Telephone Encounter (Signed)
Taken care of

## 2017-09-02 NOTE — Telephone Encounter (Signed)
Received FL2 paperwork, completed as much as possible; forwarded to provider/SLS 12/20

## 2017-09-02 NOTE — Telephone Encounter (Addendum)
Patient is needing to move into facility on Monday, need paperwork tomorrow. Made her aware of paperwork turn around time. Please call Garner GavelJennifer W/ [place] [phone]

## 2017-09-08 ENCOUNTER — Inpatient Hospital Stay (HOSPITAL_COMMUNITY)
Admission: EM | Admit: 2017-09-08 | Discharge: 2017-09-11 | DRG: 312 | Disposition: A | Discharge status: Nursing Home (Includes ICF) | Payer: Medicare Other | Attending: Internal Medicine | Admitting: Internal Medicine

## 2017-09-08 ENCOUNTER — Emergency Department (HOSPITAL_COMMUNITY): Payer: Medicare Other

## 2017-09-08 ENCOUNTER — Other Ambulatory Visit: Payer: Self-pay

## 2017-09-08 ENCOUNTER — Encounter (HOSPITAL_COMMUNITY): Payer: Self-pay | Admitting: Emergency Medicine

## 2017-09-08 DIAGNOSIS — G9340 Encephalopathy, unspecified: Secondary | ICD-10-CM | POA: Diagnosis not present

## 2017-09-08 DIAGNOSIS — E872 Acidosis, unspecified: Secondary | ICD-10-CM

## 2017-09-08 DIAGNOSIS — E860 Dehydration: Secondary | ICD-10-CM | POA: Diagnosis not present

## 2017-09-08 DIAGNOSIS — E876 Hypokalemia: Secondary | ICD-10-CM | POA: Diagnosis present

## 2017-09-08 DIAGNOSIS — I10 Essential (primary) hypertension: Secondary | ICD-10-CM | POA: Diagnosis present

## 2017-09-08 DIAGNOSIS — F329 Major depressive disorder, single episode, unspecified: Secondary | ICD-10-CM | POA: Diagnosis present

## 2017-09-08 DIAGNOSIS — Z79899 Other long term (current) drug therapy: Secondary | ICD-10-CM

## 2017-09-08 DIAGNOSIS — F03A0 Unspecified dementia, mild, without behavioral disturbance, psychotic disturbance, mood disturbance, and anxiety: Secondary | ICD-10-CM | POA: Diagnosis present

## 2017-09-08 DIAGNOSIS — N390 Urinary tract infection, site not specified: Secondary | ICD-10-CM | POA: Diagnosis present

## 2017-09-08 DIAGNOSIS — R710 Precipitous drop in hematocrit: Secondary | ICD-10-CM | POA: Diagnosis present

## 2017-09-08 DIAGNOSIS — Z7982 Long term (current) use of aspirin: Secondary | ICD-10-CM | POA: Diagnosis not present

## 2017-09-08 DIAGNOSIS — Z66 Do not resuscitate: Secondary | ICD-10-CM | POA: Diagnosis present

## 2017-09-08 DIAGNOSIS — Z87442 Personal history of urinary calculi: Secondary | ICD-10-CM

## 2017-09-08 DIAGNOSIS — K317 Polyp of stomach and duodenum: Secondary | ICD-10-CM | POA: Diagnosis not present

## 2017-09-08 DIAGNOSIS — E538 Deficiency of other specified B group vitamins: Secondary | ICD-10-CM | POA: Diagnosis present

## 2017-09-08 DIAGNOSIS — K219 Gastro-esophageal reflux disease without esophagitis: Secondary | ICD-10-CM | POA: Diagnosis not present

## 2017-09-08 DIAGNOSIS — R55 Syncope and collapse: Secondary | ICD-10-CM | POA: Diagnosis not present

## 2017-09-08 DIAGNOSIS — D649 Anemia, unspecified: Secondary | ICD-10-CM | POA: Diagnosis not present

## 2017-09-08 DIAGNOSIS — R4182 Altered mental status, unspecified: Secondary | ICD-10-CM | POA: Diagnosis not present

## 2017-09-08 DIAGNOSIS — I361 Nonrheumatic tricuspid (valve) insufficiency: Secondary | ICD-10-CM | POA: Diagnosis not present

## 2017-09-08 DIAGNOSIS — K2270 Barrett's esophagus without dysplasia: Secondary | ICD-10-CM | POA: Diagnosis present

## 2017-09-08 DIAGNOSIS — N179 Acute kidney failure, unspecified: Secondary | ICD-10-CM | POA: Diagnosis present

## 2017-09-08 DIAGNOSIS — G3184 Mild cognitive impairment, so stated: Secondary | ICD-10-CM | POA: Diagnosis not present

## 2017-09-08 DIAGNOSIS — R195 Other fecal abnormalities: Secondary | ICD-10-CM | POA: Diagnosis not present

## 2017-09-08 DIAGNOSIS — E785 Hyperlipidemia, unspecified: Secondary | ICD-10-CM | POA: Diagnosis not present

## 2017-09-08 DIAGNOSIS — R402441 Other coma, without documented Glasgow coma scale score, or with partial score reported, in the field [EMT or ambulance]: Secondary | ICD-10-CM | POA: Diagnosis not present

## 2017-09-08 DIAGNOSIS — K5900 Constipation, unspecified: Secondary | ICD-10-CM | POA: Diagnosis present

## 2017-09-08 DIAGNOSIS — Z87891 Personal history of nicotine dependence: Secondary | ICD-10-CM | POA: Diagnosis not present

## 2017-09-08 DIAGNOSIS — F0390 Unspecified dementia without behavioral disturbance: Secondary | ICD-10-CM | POA: Diagnosis present

## 2017-09-08 LAB — URINALYSIS, ROUTINE W REFLEX MICROSCOPIC
Bilirubin Urine: NEGATIVE
Glucose, UA: NEGATIVE mg/dL
Hgb urine dipstick: NEGATIVE
KETONES UR: NEGATIVE mg/dL
Nitrite: POSITIVE — AB
PROTEIN: 30 mg/dL — AB
Specific Gravity, Urine: 1.023 (ref 1.005–1.030)
pH: 5 (ref 5.0–8.0)

## 2017-09-08 LAB — CBC WITH DIFFERENTIAL/PLATELET
Basophils Absolute: 0 10*3/uL (ref 0.0–0.1)
Basophils Relative: 1 %
Eosinophils Absolute: 0 10*3/uL (ref 0.0–0.7)
Eosinophils Relative: 0 %
HEMATOCRIT: 21.5 % — AB (ref 36.0–46.0)
HEMOGLOBIN: 7.5 g/dL — AB (ref 12.0–15.0)
LYMPHS ABS: 1 10*3/uL (ref 0.7–4.0)
Lymphocytes Relative: 21 %
MCH: 31.3 pg (ref 26.0–34.0)
MCHC: 34.9 g/dL (ref 30.0–36.0)
MCV: 89.6 fL (ref 78.0–100.0)
MONOS PCT: 5 %
Monocytes Absolute: 0.2 10*3/uL (ref 0.1–1.0)
NEUTROS ABS: 3.2 10*3/uL (ref 1.7–7.7)
NEUTROS PCT: 73 %
Platelets: 196 10*3/uL (ref 150–400)
RBC: 2.4 MIL/uL — ABNORMAL LOW (ref 3.87–5.11)
RDW: 15.9 % — ABNORMAL HIGH (ref 11.5–15.5)
WBC: 4.5 10*3/uL (ref 4.0–10.5)

## 2017-09-08 LAB — COMPREHENSIVE METABOLIC PANEL
ALBUMIN: 4.1 g/dL (ref 3.5–5.0)
ALK PHOS: 40 U/L (ref 38–126)
ALT: 21 U/L (ref 14–54)
ANION GAP: 14 (ref 5–15)
AST: 39 U/L (ref 15–41)
BUN: 15 mg/dL (ref 6–20)
CALCIUM: 9.5 mg/dL (ref 8.9–10.3)
CO2: 20 mmol/L — AB (ref 22–32)
Chloride: 106 mmol/L (ref 101–111)
Creatinine, Ser: 1.05 mg/dL — ABNORMAL HIGH (ref 0.44–1.00)
GFR calc Af Amer: >60 mL/min (ref >60)
GFR calc non Af Amer: 55 mL/min — ABNORMAL LOW (ref >60)
GLUCOSE: 180 mg/dL — AB (ref 65–99)
POTASSIUM: 2.9 mmol/L — AB (ref 3.5–5.1)
SODIUM: 140 mmol/L (ref 135–145)
Total Bilirubin: 2.3 mg/dL — ABNORMAL HIGH (ref 0.3–1.2)
Total Protein: 6.8 g/dL (ref 6.5–8.1)

## 2017-09-08 LAB — I-STAT CG4 LACTIC ACID, ED
LACTIC ACID, VENOUS: 3.95 mmol/L — AB (ref 0.5–1.9)
Lactic Acid, Venous: 1.61 mmol/L (ref 0.5–1.9)

## 2017-09-08 LAB — I-STAT TROPONIN, ED: Troponin i, poc: 0 ng/mL (ref 0.00–0.08)

## 2017-09-08 LAB — LIPASE, BLOOD: Lipase: 35 U/L (ref 11–51)

## 2017-09-08 LAB — POC OCCULT BLOOD, ED: FECAL OCCULT BLD: NEGATIVE

## 2017-09-08 MED ORDER — PIPERACILLIN-TAZOBACTAM 3.375 G IVPB 30 MIN
3.3750 g | Freq: Once | INTRAVENOUS | Status: AC
  Administered 2017-09-08: 3.375 g via INTRAVENOUS
  Filled 2017-09-08: qty 50

## 2017-09-08 MED ORDER — VANCOMYCIN HCL IN DEXTROSE 750-5 MG/150ML-% IV SOLN
750.0000 mg | Freq: Two times a day (BID) | INTRAVENOUS | Status: DC
  Filled 2017-09-08: qty 150

## 2017-09-08 MED ORDER — PIPERACILLIN-TAZOBACTAM 3.375 G IVPB
3.3750 g | Freq: Three times a day (TID) | INTRAVENOUS | Status: DC
  Filled 2017-09-08: qty 50

## 2017-09-08 MED ORDER — SODIUM CHLORIDE 0.9 % IV BOLUS (SEPSIS)
1000.0000 mL | Freq: Once | INTRAVENOUS | Status: AC
  Administered 2017-09-08: 1000 mL via INTRAVENOUS

## 2017-09-08 MED ORDER — SODIUM CHLORIDE 0.9 % IV BOLUS (SEPSIS)
500.0000 mL | Freq: Once | INTRAVENOUS | Status: AC
  Administered 2017-09-08: 500 mL via INTRAVENOUS

## 2017-09-08 MED ORDER — ONDANSETRON HCL 4 MG/2ML IJ SOLN
4.0000 mg | Freq: Once | INTRAMUSCULAR | Status: AC
  Administered 2017-09-08: 4 mg via INTRAVENOUS
  Filled 2017-09-08: qty 2

## 2017-09-08 MED ORDER — VANCOMYCIN HCL IN DEXTROSE 1-5 GM/200ML-% IV SOLN
1000.0000 mg | Freq: Once | INTRAVENOUS | Status: AC
  Administered 2017-09-08: 1000 mg via INTRAVENOUS
  Filled 2017-09-08: qty 200

## 2017-09-08 NOTE — ED Notes (Signed)
Dr Jacqulyn BathLong given a copy of lactic acid 3.95

## 2017-09-08 NOTE — ED Triage Notes (Signed)
Per EMS: [facility] with c/o a witnessed syncopal episode. Staff from [facility] states pt did not hit her head.  When EMS arrived on scene, pt was pale and unable to answer questions. Staff was also unable to provide pt's last known well time.  Pt family reports no seizure like history.  PTA vitals:  BP 116/66, HR 108, RR 15, Sp02 98% RA, CBG 142.

## 2017-09-08 NOTE — ED Notes (Signed)
Patient transported to CT 

## 2017-09-08 NOTE — ED Provider Notes (Signed)
Emergency Department Provider Note   I have reviewed the triage vital signs and the nursing notes.   HISTORY  Chief Complaint Altered Mental Status   HPI Tina Fitzpatrick is a 66 y.o. female with PMH of HLD, HTN, GERD, and and dementia recently admitted to a memory care unit presents to the emergency department for evaluation of questionable syncope.  Family at bedside state that they were made aware the patient was sitting in recliner when she suddenly shouted out and then became unresponsive.  Family state that she was "foaming at the mouth."  No history of seizure disorder.  Family state [facility] denied any specific seizure activity. No recent infection or medication changes. Family state that the patient is more confused than normal.   Level 5 caveat: Dementia    Past Medical History:  Diagnosis Date  . DDD (degenerative disc disease), cervical   . Dementia   . Depression   . GERD (gastroesophageal reflux disease)   . Hyperlipidemia   . Hypertension   . Kidney stones     Patient Active Problem List   Diagnosis Date Noted  . Acute encephalopathy 09/08/2017  . 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 Orthostatic hypotension 10/30/2015  . Mild dementia 07/29/2015  . Encephalopathy, metabolic 06/24/2015  . Syncope 06/22/2013  . GERD (gastroesophageal reflux disease)   . Essential hypertension   . Hyperlipidemia   . Depression   . DDD (degenerative disc disease), cervical     Past Surgical History:  Procedure Laterality Date  . kidney stones  1988  . NECK SURGERY  2008   Plate/Screws    Current Outpatient Rx  . Order #: [id number] Class: Print  . Order #: [id number] Class: Historical Med  . Order #: [id number] Class: Historical Med  . Order #: [id number] Class: Normal  . Order #: [id number] Class: Print  . Order #: [id number] Class: Historical Med  . Order #: [id number] Class: Normal  . Order #:  [id number] Class: Normal  . Order #: [id number] Class: Print  . Order #: [id number] Class: Print    Allergies Patient has no known allergies.  Family History  Problem Relation Age of Onset  . Heart attack Mother     Social History Social History   Tobacco Use  . Smoking status: Former Smoker    Types: Cigarettes  . Smokeless tobacco: Never Used  Substance Use Topics  . Alcohol use: No    Alcohol/week: 0.0 oz  . Drug use: No    Review of Systems  Level 5 caveat: Level 5 caveat:   ____________________________________________   PHYSICAL EXAM:  VITAL SIGNS: ED Triage Vitals  Enc Vitals Group     BP 09/08/17 2030 117/71     Pulse Rate 09/08/17 2030 (!) 101     Resp 09/08/17 2030 13     Temp 09/08/17 2030 98.4 F (36.9 C)     Temp Source 09/08/17 2030 Oral     SpO2 09/08/17 2030 100 %     Weight 09/08/17 2027 161 lb (73 kg)     Height 09/08/17 2027 5\' 6"  (1.676 m)   Constitutional: Drowsy and confused. No acute distress. Eyes: Conjunctivae are normal.  Head: Atraumatic. Nose: No congestion/rhinnorhea. Mouth/Throat: Mucous membranes are dry.  Neck: No stridor.   Cardiovascular: Tachycardia. Good peripheral circulation. Grossly normal heart sounds.   Respiratory: Normal respiratory effort.  No retractions. Lungs CTAB. Gastrointestinal: Soft and nontender. No distention. Rectal exam with brown stool. No gross blood  or melena.  Musculoskeletal: No lower extremity tenderness nor edema. No gross deformities of extremities. Neurologic:  Some word finding difficulty but no dysarthria. No gross focal neurologic deficits are appreciated. No pronator drift. CN 2-12 normal 2-12.  Skin:  Skin is warm, dry and intact. No rash noted.  ____________________________________________   LABS (all labs ordered are listed, but only abnormal results are displayed)  Labs Reviewed  COMPREHENSIVE METABOLIC PANEL - Abnormal; Notable for the following components:      Result Value    Potassium 2.9 (*)    CO2 20 (*)    Glucose, Bld 180 (*)    Creatinine, Ser 1.05 (*)    Total Bilirubin 2.3 (*)    GFR calc non Af Amer 55 (*)    All other components within normal limits  CBC WITH DIFFERENTIAL/PLATELET - Abnormal; Notable for the following components:   RBC 2.40 (*)    Hemoglobin 7.5 (*)    HCT 21.5 (*)    RDW 15.9 (*)    All other components within normal limits  I-STAT CG4 LACTIC ACID, ED - Abnormal; Notable for the following components:   Lactic Acid, Venous 3.95 (*)    All other components within normal limits  URINE CULTURE  CULTURE, BLOOD (ROUTINE X 2)  CULTURE, BLOOD (ROUTINE X 2)  LIPASE, BLOOD  URINALYSIS, ROUTINE W REFLEX MICROSCOPIC  I-STAT TROPONIN, ED  I-STAT CG4 LACTIC ACID, ED   ____________________________________________  EKG   EKG Interpretation  Date/Time:  Wednesday September 08 2017 20:19:20 EST Ventricular Rate:  102 PR Interval:    QRS Duration: 108 QT Interval:  342 QTC Calculation: 446 R Axis:   6 Text Interpretation:  Sinus tachycardia Low voltage, precordial leads Nonspecific T abnormalities, diffuse leads Baseline wander in lead(s) V6 No STEMI.  Confirmed by Alona BeneLong, Samai Corea [phone]) on 09/08/2017 8:34:40 PM       ____________________________________________  RADIOLOGY  Dg Chest 2 View  Result Date: 09/08/2017 CLINICAL DATA:  Syncope EXAM: CHEST  2 VIEW COMPARISON:  10/30/2015 FINDINGS: The heart size and mediastinal contours are within normal limits. Both lungs are clear. Surgical changes of the lower cervical spine. IMPRESSION: No active cardiopulmonary disease. Electronically Signed   By: Jasmine PangKim  Fujinaga M.D.   On: 09/08/2017 21:25   Ct Head Wo Contrast  Result Date: 09/08/2017 CLINICAL DATA:  66 year old female with altered mental status. EXAM: CT HEAD WITHOUT CONTRAST TECHNIQUE: Contiguous axial images were obtained from the base of the skull through the vertex without intravenous contrast. COMPARISON:  Head CT dated  10/30/2015 FINDINGS: Brain: There is minimal age-related atrophy and chronic microvascular ischemic changes. There is no acute intracranial hemorrhage. No mass effect or midline shift. No extra-axial fluid collection. Vascular: No hyperdense vessel or unexpected calcification. Skull: Normal. Negative for fracture or focal lesion. Sinuses/Orbits: No acute finding. Other: None IMPRESSION: 1. No acute intracranial pathology. 2. Minimal age-appropriate atrophy. Electronically Signed   By: Elgie CollardArash  Radparvar M.D.   On: 09/08/2017 22:02    ____________________________________________   PROCEDURES  Procedure(s) performed:   Procedures  CRITICAL CARE Performed by: Maia PlanJoshua G Rami Budhu Total critical care time: 32 minutes Critical care time was exclusive of separately billable procedures and treating other patients. Critical care was necessary to treat or prevent imminent or life-threatening deterioration. Critical care was time spent personally by me on the following activities: development of treatment plan with patient and/or surrogate as well as nursing, discussions with consultants, evaluation of patient's response to treatment, examination of patient, obtaining  history from patient or surrogate, ordering and performing treatments and interventions, ordering and review of laboratory studies, ordering and review of radiographic studies, pulse oximetry and re-evaluation of patient's condition.  Alona Bene, MD Emergency Medicine  ____________________________________________   INITIAL IMPRESSION / ASSESSMENT AND PLAN / ED COURSE  Pertinent labs & imaging results that were available during my care of the patient were reviewed by me and considered in my medical decision making (see chart for details).  Patient presents emergency department for evaluation of questionable syncope versus seizure.  She has recently started living at the [facility] memory care unit reportedly resting in recliner when the  event occurred.  Family states she is more confused now than normal.  She has no focal neurological deficits, however.  Plan for CT imaging of the head.  Given the question of some foaming at the mouth plan for chest x-ray along with labs, UA, and continued monitoring. Question seizure vs developing infectious process.   Lactate down-trending. Hb lower than prior. Patient awake and alert but confused. Rectal exam with brown stool.   Discussed patient's case with Hospitalist, Dr. Toniann Fail to request admission. Patient and family (if present) updated with plan. Care transferred to Hospitalist service.  I reviewed all nursing notes, vitals, pertinent old records, EKGs, labs, imaging (as available).  ____________________________________________  FINAL CLINICAL IMPRESSION(S) / ED DIAGNOSES  Final diagnoses:  Altered mental status, unspecified altered mental status type  Lactic acidosis     MEDICATIONS GIVEN DURING THIS VISIT:  Medications  vancomycin (VANCOCIN) IVPB 1000 mg/200 mL premix (1,000 mg Intravenous New Bag/Given 09/08/17 2253)  piperacillin-tazobactam (ZOSYN) IVPB 3.375 g (not administered)  vancomycin (VANCOCIN) IVPB 750 mg/150 ml premix (not administered)  sodium chloride 0.9 % bolus 500 mL (0 mLs Intravenous Stopped 09/08/17 2154)  ondansetron (ZOFRAN) injection 4 mg (4 mg Intravenous Given 09/08/17 2107)  piperacillin-tazobactam (ZOSYN) IVPB 3.375 g (0 g Intravenous Stopped 09/08/17 2259)  sodium chloride 0.9 % bolus 1,000 mL (0 mLs Intravenous Stopped 09/08/17 2259)    Note:  This document was prepared using Dragon voice recognition software and may include unintentional dictation errors.  Alona Bene, MD Emergency Medicine    Sawsan Riggio, Arlyss Repress, MD 09/08/17 [phone]

## 2017-09-08 NOTE — ED Notes (Signed)
Husband at the bedside, states pt recently went to [facility] due to his inability to take care of her.  States she was having multiple falls.  He also made the comment at times "her eyes were open but nobody was home".

## 2017-09-08 NOTE — Progress Notes (Signed)
Pharmacy Antibiotic Note  Tina Fitzpatrick is a 66 y.o. female admitted on 09/08/2017 with sepsis.  Plan: Zosyn 3.375 gm iv q8h Vanc 1 g x 1 then 750 mg q12h Monitor renal fx cx vt prn  Height: 5\' 6"  (167.6 cm) Weight: 161 lb (73 kg) IBW/kg (Calculated) : 59.3  Temp (24hrs), Avg:98.4 F (36.9 C), Min:98.4 F (36.9 C), Max:98.4 F (36.9 C)  Recent Labs  Lab 09/08/17 2102 09/08/17 2109  WBC 4.5  --   CREATININE 1.05*  --   LATICACIDVEN  --  3.95*    Estimated Creatinine Clearance: 54.6 mL/min (A) (by C-G formula based on SCr of 1.05 mg/dL (H)).    No Known Allergies  Tina Fitzpatrick, PharmD, BCPS, BCCCP Clinical Pharmacist Clinical phone for 09/08/2017 from [phone]: [phone] If after 2300, please call main pharmacy at: [phone] 09/08/2017 9:59 PM

## 2017-09-08 NOTE — ED Notes (Signed)
Patient transported to X-ray 

## 2017-09-09 ENCOUNTER — Observation Stay (HOSPITAL_COMMUNITY): Payer: Medicare Other

## 2017-09-09 ENCOUNTER — Encounter (HOSPITAL_COMMUNITY): Payer: Self-pay | Admitting: Internal Medicine

## 2017-09-09 DIAGNOSIS — N390 Urinary tract infection, site not specified: Secondary | ICD-10-CM | POA: Diagnosis present

## 2017-09-09 DIAGNOSIS — Z87442 Personal history of urinary calculi: Secondary | ICD-10-CM | POA: Diagnosis not present

## 2017-09-09 DIAGNOSIS — G9340 Encephalopathy, unspecified: Secondary | ICD-10-CM | POA: Diagnosis not present

## 2017-09-09 DIAGNOSIS — K5900 Constipation, unspecified: Secondary | ICD-10-CM | POA: Diagnosis not present

## 2017-09-09 DIAGNOSIS — R710 Precipitous drop in hematocrit: Secondary | ICD-10-CM | POA: Diagnosis present

## 2017-09-09 DIAGNOSIS — E860 Dehydration: Secondary | ICD-10-CM | POA: Diagnosis present

## 2017-09-09 DIAGNOSIS — K219 Gastro-esophageal reflux disease without esophagitis: Secondary | ICD-10-CM | POA: Diagnosis present

## 2017-09-09 DIAGNOSIS — Z7982 Long term (current) use of aspirin: Secondary | ICD-10-CM | POA: Diagnosis not present

## 2017-09-09 DIAGNOSIS — Z79899 Other long term (current) drug therapy: Secondary | ICD-10-CM | POA: Diagnosis not present

## 2017-09-09 DIAGNOSIS — I10 Essential (primary) hypertension: Secondary | ICD-10-CM | POA: Diagnosis not present

## 2017-09-09 DIAGNOSIS — Z66 Do not resuscitate: Secondary | ICD-10-CM | POA: Diagnosis present

## 2017-09-09 DIAGNOSIS — I361 Nonrheumatic tricuspid (valve) insufficiency: Secondary | ICD-10-CM | POA: Diagnosis not present

## 2017-09-09 DIAGNOSIS — D649 Anemia, unspecified: Secondary | ICD-10-CM

## 2017-09-09 DIAGNOSIS — R4182 Altered mental status, unspecified: Secondary | ICD-10-CM | POA: Diagnosis not present

## 2017-09-09 DIAGNOSIS — N179 Acute kidney failure, unspecified: Secondary | ICD-10-CM | POA: Diagnosis present

## 2017-09-09 DIAGNOSIS — Z87891 Personal history of nicotine dependence: Secondary | ICD-10-CM | POA: Diagnosis not present

## 2017-09-09 DIAGNOSIS — F329 Major depressive disorder, single episode, unspecified: Secondary | ICD-10-CM | POA: Diagnosis present

## 2017-09-09 DIAGNOSIS — E538 Deficiency of other specified B group vitamins: Secondary | ICD-10-CM | POA: Diagnosis present

## 2017-09-09 DIAGNOSIS — R195 Other fecal abnormalities: Secondary | ICD-10-CM

## 2017-09-09 DIAGNOSIS — F0390 Unspecified dementia without behavioral disturbance: Secondary | ICD-10-CM | POA: Diagnosis present

## 2017-09-09 DIAGNOSIS — E785 Hyperlipidemia, unspecified: Secondary | ICD-10-CM | POA: Diagnosis present

## 2017-09-09 DIAGNOSIS — E876 Hypokalemia: Secondary | ICD-10-CM | POA: Diagnosis present

## 2017-09-09 DIAGNOSIS — K317 Polyp of stomach and duodenum: Secondary | ICD-10-CM | POA: Diagnosis present

## 2017-09-09 DIAGNOSIS — K2270 Barrett's esophagus without dysplasia: Secondary | ICD-10-CM | POA: Diagnosis present

## 2017-09-09 DIAGNOSIS — E872 Acidosis: Secondary | ICD-10-CM | POA: Diagnosis not present

## 2017-09-09 DIAGNOSIS — R4020 Unspecified coma: Secondary | ICD-10-CM | POA: Diagnosis not present

## 2017-09-09 DIAGNOSIS — R55 Syncope and collapse: Secondary | ICD-10-CM | POA: Diagnosis present

## 2017-09-09 LAB — IRON AND TIBC
IRON: 90 ug/dL (ref 28–170)
Saturation Ratios: 25 % (ref 10.4–31.8)
TIBC: 361 ug/dL (ref 250–450)
UIBC: 271 ug/dL

## 2017-09-09 LAB — LACTIC ACID, PLASMA: LACTIC ACID, VENOUS: 1.4 mmol/L (ref 0.5–1.9)

## 2017-09-09 LAB — MAGNESIUM
Magnesium: <0.1 mg/dL — CL (ref 1.7–2.4)
Magnesium: 2.5 mg/dL — ABNORMAL HIGH (ref 1.7–2.4)

## 2017-09-09 LAB — CBC
HCT: 18.9 % — ABNORMAL LOW (ref 36.0–46.0)
HCT: 28.6 % — ABNORMAL LOW (ref 36.0–46.0)
HEMOGLOBIN: 6.6 g/dL — AB (ref 12.0–15.0)
Hemoglobin: 9.9 g/dL — ABNORMAL LOW (ref 12.0–15.0)
MCH: 31.3 pg (ref 26.0–34.0)
MCH: 30.1 pg (ref 26.0–34.0)
MCHC: 34.9 g/dL (ref 30.0–36.0)
MCHC: 34.6 g/dL (ref 30.0–36.0)
MCV: 89.6 fL (ref 78.0–100.0)
MCV: 86.9 fL (ref 78.0–100.0)
PLATELETS: 158 10*3/uL (ref 150–400)
PLATELETS: 166 10*3/uL (ref 150–400)
RBC: 2.11 MIL/uL — AB (ref 3.87–5.11)
RBC: 3.29 MIL/uL — ABNORMAL LOW (ref 3.87–5.11)
RDW: 16.3 % — ABNORMAL HIGH (ref 11.5–15.5)
RDW: 16.8 % — AB (ref 11.5–15.5)
WBC: 4.8 10*3/uL (ref 4.0–10.5)
WBC: 5 10*3/uL (ref 4.0–10.5)

## 2017-09-09 LAB — GLUCOSE, CAPILLARY: GLUCOSE-CAPILLARY: 136 mg/dL — AB (ref 65–99)

## 2017-09-09 LAB — RETICULOCYTES
RBC.: 2.11 MIL/uL — ABNORMAL LOW (ref 3.87–5.11)
RETIC CT PCT: 6.1 % — AB (ref 0.4–3.1)
Retic Count, Absolute: 128.7 10*3/uL (ref 19.0–186.0)

## 2017-09-09 LAB — PREPARE RBC (CROSSMATCH)

## 2017-09-09 LAB — HIV ANTIBODY (ROUTINE TESTING W REFLEX): HIV Screen 4th Generation wRfx: NONREACTIVE

## 2017-09-09 LAB — BASIC METABOLIC PANEL
Anion gap: 8 (ref 5–15)
BUN: 7 mg/dL (ref 6–20)
CALCIUM: 8.7 mg/dL — AB (ref 8.9–10.3)
CO2: 22 mmol/L (ref 22–32)
CREATININE: 1.01 mg/dL — AB (ref 0.44–1.00)
Chloride: 111 mmol/L (ref 101–111)
GFR, EST NON AFRICAN AMERICAN: 57 mL/min — AB (ref >60)
Glucose, Bld: 129 mg/dL — ABNORMAL HIGH (ref 65–99)
Potassium: 3.2 mmol/L — ABNORMAL LOW (ref 3.5–5.1)
SODIUM: 141 mmol/L (ref 135–145)

## 2017-09-09 LAB — VITAMIN B12: Vitamin B-12: 165 pg/mL — ABNORMAL LOW (ref 180–914)

## 2017-09-09 LAB — CREATININE, SERUM
CREATININE: 0.93 mg/dL (ref 0.44–1.00)
GFR calc non Af Amer: >60 mL/min (ref >60)

## 2017-09-09 LAB — FERRITIN: FERRITIN: 419 ng/mL — AB (ref 11–307)

## 2017-09-09 LAB — FOLATE: Folate: 32 ng/mL (ref >5.9)

## 2017-09-09 LAB — ABO/RH: ABO/RH(D): O POS

## 2017-09-09 MED ORDER — GABAPENTIN 100 MG PO CAPS
100.0000 mg | ORAL_CAPSULE | Freq: Three times a day (TID) | ORAL | Status: DC
  Administered 2017-09-09 – 2017-09-11 (×7): 100 mg via ORAL
  Filled 2017-09-09 (×7): qty 1

## 2017-09-09 MED ORDER — ASPIRIN EC 81 MG PO TBEC: 81.0000 mg | DELAYED_RELEASE_TABLET | Freq: Every day | ORAL | Status: DC

## 2017-09-09 MED ORDER — CYANOCOBALAMIN 1000 MCG/ML IJ SOLN
1000.0000 ug | Freq: Once | INTRAMUSCULAR | Status: AC
  Administered 2017-09-09: 1000 ug via INTRAMUSCULAR
  Filled 2017-09-09: qty 1

## 2017-09-09 MED ORDER — FOLIC ACID 1 MG PO TABS
1.0000 mg | ORAL_TABLET | Freq: Every day | ORAL | Status: DC
  Administered 2017-09-09 – 2017-09-11 (×3): 1 mg via ORAL
  Filled 2017-09-09 (×3): qty 1

## 2017-09-09 MED ORDER — ACETAMINOPHEN 650 MG RE SUPP: 650.0000 mg | RECTAL | Status: DC | PRN

## 2017-09-09 MED ORDER — DOCUSATE SODIUM 100 MG PO CAPS
100.0000 mg | ORAL_CAPSULE | Freq: Every day | ORAL | Status: DC
  Administered 2017-09-09 – 2017-09-11 (×3): 100 mg via ORAL
  Filled 2017-09-09 (×3): qty 1

## 2017-09-09 MED ORDER — LISINOPRIL 5 MG PO TABS: 5.0000 mg | ORAL_TABLET | Freq: Every day | ORAL | Status: DC

## 2017-09-09 MED ORDER — FENOFIBRATE 160 MG PO TABS
160.0000 mg | ORAL_TABLET | Freq: Every day | ORAL | Status: DC
  Administered 2017-09-09 – 2017-09-11 (×3): 160 mg via ORAL
  Filled 2017-09-09 (×3): qty 1

## 2017-09-09 MED ORDER — ATENOLOL 25 MG PO TABS
50.0000 mg | ORAL_TABLET | Freq: Every day | ORAL | Status: DC
  Administered 2017-09-11: 50 mg via ORAL
  Filled 2017-09-09 (×3): qty 2

## 2017-09-09 MED ORDER — SODIUM CHLORIDE 0.9 % IV SOLN
Freq: Once | INTRAVENOUS | Status: AC
  Administered 2017-09-09: 06:00:00 via INTRAVENOUS

## 2017-09-09 MED ORDER — ALPRAZOLAM 0.5 MG PO TABS
0.5000 mg | ORAL_TABLET | Freq: Two times a day (BID) | ORAL | Status: DC | PRN
  Administered 2017-09-09 – 2017-09-10 (×4): 0.5 mg via ORAL
  Filled 2017-09-09 (×4): qty 1

## 2017-09-09 MED ORDER — MAGNESIUM SULFATE 4 GM/100ML IV SOLN
4.0000 g | Freq: Once | INTRAVENOUS | Status: AC
  Administered 2017-09-09: 4 g via INTRAVENOUS
  Filled 2017-09-09: qty 100

## 2017-09-09 MED ORDER — ENOXAPARIN SODIUM 40 MG/0.4ML ~~LOC~~ SOLN: 40.0000 mg | Freq: Every day | SUBCUTANEOUS | Status: DC

## 2017-09-09 MED ORDER — ACETAMINOPHEN 325 MG PO TABS: 650.0000 mg | ORAL_TABLET | ORAL | Status: DC | PRN

## 2017-09-09 MED ORDER — DEXTROSE 5 % IV SOLN
1.0000 g | Freq: Every day | INTRAVENOUS | Status: DC
  Administered 2017-09-09 – 2017-09-11 (×3): 1 g via INTRAVENOUS
  Filled 2017-09-09 (×3): qty 10

## 2017-09-09 MED ORDER — ACETAMINOPHEN 160 MG/5ML PO SOLN: 650.0000 mg | ORAL | Status: DC | PRN

## 2017-09-09 MED ORDER — POTASSIUM CHLORIDE CRYS ER 20 MEQ PO TBCR
40.0000 meq | EXTENDED_RELEASE_TABLET | Freq: Every day | ORAL | Status: DC
  Administered 2017-09-09 – 2017-09-11 (×3): 40 meq via ORAL
  Filled 2017-09-09 (×3): qty 2

## 2017-09-09 MED ORDER — SODIUM CHLORIDE 0.9 % IV SOLN
INTRAVENOUS | Status: AC
  Administered 2017-09-09: 23:00:00 via INTRAVENOUS

## 2017-09-09 NOTE — Progress Notes (Signed)
   Follow Up Note  Please see HPI dated 09/09/17 by Dr Toniann FailKakrakandy for full details/history.  Pt with Hx of HLD, HTN, GERD, Dementia, admitted earlier this morning for acute encephalopathy/foaming in the mouth, UTI, hypo K+, Mg+, acute drop in hgb with FOBT neg. Seen after arrived to floor. Pt unable to give accurate hx, but pt husband present at bedside. Pt denies any new complains, follows command, no focal neurologic deficit noted. Pt denies any chest pain, SOB, abdominal pain, N/V/D/C, melena, hematochezia or hematemesis.  Today, pt noted to have a drop in hemoglobin, transfusing 2U of PRBC. Replaced both Mg and K as they were severly depleted. Pt pending MRI head. Started on IV rocephin for ??UTI. BC and UC pending. Resolved Lactic acidosis. GI consulted for acute drop in hemoglobin.  Exam: CV: S1-S2 present, no added hrt sound Lungs: Chest clear bilaterally Abd: Soft, non-tender, non distended, BS present Ext: No pedal edema bilaterally  Present on Admission: . Acute encephalopathy . Essential hypertension . Mild dementia . Normochromic normocytic anemia . Acute lower UTI   Plan: Continue present management as noted

## 2017-09-09 NOTE — Progress Notes (Signed)
Admitted to 3W01, disoriented to situation, spouse at bedside to provide reassurance.  IV infusing upon arrivea, pur-wick intact upon arrival.  NPO at present, speech clear, follows commands slowly.  Spouse oriented to safety precautions, plan of care & orders.  Verbalized understanding.

## 2017-09-09 NOTE — Progress Notes (Signed)
Pt appears very tearful compared to earlier this morning. Pt appears to have trouble expressing thoughts and feelings. Pt has expressed desire to get out of the bed multiple times but does not appear to be oriented to where she is or what is going on. Pt keeps talking about pizzas and Jamie. RN, NT, and spouse used relaxation techniques to help keep pt calm but pt still appeared to be very anxious, tearful, and frustrated. RN offered pt anti-anxiety medicine which pt stated willing to take. While giving pt med, pt appeared to be concerned about people seeing her take it and hallucinated seeing people walk by her door. Pt also did not want anyone to know she was taking med.   Pt verbalized hallucination of magazines and shelves in pt's room while RN monitored pt for first 15 min. Of blood transfusion. RN will continue to monitor pt.

## 2017-09-09 NOTE — H&P (Signed)
History and Physical    Tina ModyJanet L Fitzpatrick NFA:[id number] DOB: 1951-07-18 DOA: 09/08/2017  PCP: Pearline Cablesopland, Jessica C, MD  Patient coming from: Skilled nursing facility.  Patient is in a memory unit.  Chief Complaint: Patient was found to have foaming in the mouth.  HPI: Tina Fitzpatrick is a 66 y.o. female with history of advanced dementia was recently placed in skilled nursing facility memory unit 3 days ago last evening was found to have suddenly slumped onto her seat while watching TV and foaming on her mouth.  Patient's husband was called and patient was brought to the ER.  Patient has been states by the time patient came to the ER patient was back to her baseline.  Normally is exactly witnessed episode and not known if patient had any seizure-like activity or has had loss consciousness.  ED Course: In the ER patient's lactic acid level was 3.9 and hemoglobin was 7.5 which is a drop of almost 5 g from October this year.  Stool for occult blood was negative.  Patient's creatinine had increased from October 0.7-1.05.  Patient was given fluid bolus for possible sepsis and started on empiric antibiotics after blood cultures were obtained.  CT head was unremarkable.  On my exam patient is back to baseline not in distress.  Mildly febrile.  Patient as per the husband did not have any nausea vomiting diarrhea chest pain shortness of breath productive cough fever chills or any change in medications.  Review of Systems: As per HPI, rest all negative.   Past Medical History:  Diagnosis Date  . DDD (degenerative disc disease), cervical   . Dementia   . Depression   . GERD (gastroesophageal reflux disease)   . Hyperlipidemia   . Hypertension   . Kidney stones     Past Surgical History:  Procedure Laterality Date  . kidney stones  1988  . NECK SURGERY  2008   Plate/Screws     reports that she has quit smoking. Her smoking use included cigarettes. she has never used smokeless tobacco. She  reports that she does not drink alcohol or use drugs.  No Known Allergies  Family History  Problem Relation Age of Onset  . Heart attack Mother     Prior to Admission medications   Medication Sig Start Date End Date Taking? Authorizing Provider  ALPRAZolam Prudy Feeler(XANAX) 0.5 MG tablet Take 1 tablet (0.5 mg total) by mouth 2 (two) times daily as needed for anxiety. 11/11/15  Yes Sheliah Hatchabori, Katherine E, MD  aspirin 81 MG tablet Take 81 mg by mouth daily.   Yes [provider]  atenolol (TENORMIN) 50 MG tablet Take 50 mg by mouth daily.   Yes [provider]  fenofibrate 160 MG tablet Take 1 tablet (160 mg total) by mouth daily. 11/02/16  Yes Copland, Gwenlyn FoundJessica C, MD  folic acid (FOLVITE) 1 MG tablet Take 1 tablet (1 mg total) by mouth daily. 11/02/15  Yes Rai, Ripudeep K, MD  gabapentin (NEURONTIN) 100 MG capsule TAKE 100 mg CAPSULE BY MOUTH THREE TIMES DAILY 04/13/17  Yes [provider]  lisinopril (PRINIVIL,ZESTRIL) 5 MG tablet TAKE ONE TABLET BY MOUTH ONCE DAILY Patient taking differently: Take 5 mg by mouth daily. TAKE ONE TABLET BY MOUTH ONCE DAILY 11/02/16  Yes Copland, Gwenlyn FoundJessica C, MD  metoprolol (LOPRESSOR) 50 MG tablet TAKE ONE TABLET BY MOUTH TWICE DAILY Patient taking differently: TAKE 50 mg  TABLET BY MOUTH TWICE DAILY 11/17/16  Yes Copland, Gwenlyn FoundJessica C, MD  ondansetron (ZOFRAN) 4 MG tablet Take 1 tablet (4 mg total) by mouth every 8 (eight) hours as needed for nausea or vomiting. 07/14/17  Yes Khatri, Hina, PA-C  potassium chloride SA (K-DUR,KLOR-CON) 20 MEQ tablet Take 2 tablets (40 mEq total) by mouth daily. 07/14/17 09/08/17 Yes Dietrich Pates, PA-C    Physical Exam: Vitals:   09/09/17 0115 09/09/17 0130 09/09/17 0154 09/09/17 0242  BP: 109/65 123/76 105/70 (!) 117/59  Pulse: 87 95 91 93  Resp: 16  16 18   Temp:    99.5 F (37.5 C)  TempSrc:    Oral  SpO2: 98% 96% 99% 98%  Weight:    73 kg (160 lb 15 oz)  Height:    5\' 6"  (1.676 m)      Constitutional:  Moderately built and nourished. Vitals:   09/09/17 0115 09/09/17 0130 09/09/17 0154 09/09/17 0242  BP: 109/65 123/76 105/70 (!) 117/59  Pulse: 87 95 91 93  Resp: 16  16 18   Temp:    99.5 F (37.5 C)  TempSrc:    Oral  SpO2: 98% 96% 99% 98%  Weight:    73 kg (160 lb 15 oz)  Height:    5\' 6"  (1.676 m)   Eyes: Anicteric no pallor. ENMT: No discharge from the ears eyes nose or mouth. Neck: No JVD appreciated no mass felt. Respiratory: No rhonchi or crepitations. Cardiovascular: S1-S2 heard no murmurs appreciated. Abdomen: Soft nontender bowel sounds present. Musculoskeletal: No edema.  No joint effusion. Skin: No rash.  Skin appears warm. Neurologic: Alert awake oriented to name.  Moves all extremities. Psychiatric: Has advanced dementia.   Labs on Admission: I have personally reviewed following labs and imaging studies  CBC: Recent Labs  Lab 09/08/17 2102  WBC 4.5  NEUTROABS 3.2  HGB 7.5*  HCT 21.5*  MCV 89.6  PLT 196   Basic Metabolic Panel: Recent Labs  Lab 09/08/17 2102  NA 140  K 2.9*  CL 106  CO2 20*  GLUCOSE 180*  BUN 15  CREATININE 1.05*  CALCIUM 9.5   GFR: Estimated Creatinine Clearance: 54.6 mL/min (A) (by C-G formula based on SCr of 1.05 mg/dL (H)). Liver Function Tests: Recent Labs  Lab 09/08/17 2102  AST 39  ALT 21  ALKPHOS 40  BILITOT 2.3*  PROT 6.8  ALBUMIN 4.1   Recent Labs  Lab 09/08/17 2102  LIPASE 35   No results for input(s): AMMONIA in the last 168 hours. Coagulation Profile: No results for input(s): INR, PROTIME in the last 168 hours. Cardiac Enzymes: No results for input(s): CKTOTAL, CKMB, CKMBINDEX, TROPONINI in the last 168 hours. BNP (last 3 results) No results for input(s): PROBNP in the last 8760 hours. HbA1C: No results for input(s): HGBA1C in the last 72 hours. CBG: No results for input(s): GLUCAP in the last 168 hours. Lipid Profile: No results for input(s): CHOL, HDL, LDLCALC, TRIG, CHOLHDL, LDLDIRECT in the  last 72 hours. Thyroid Function Tests: No results for input(s): TSH, T4TOTAL, FREET4, T3FREE, THYROIDAB in the last 72 hours. Anemia Panel: No results for input(s): VITAMINB12, FOLATE, FERRITIN, TIBC, IRON, RETICCTPCT in the last 72 hours. Urine analysis:    Component Value Date/Time   COLORURINE AMBER (A) 09/08/2017 2333   APPEARANCEUR CLOUDY (A) 09/08/2017 2333   LABSPEC 1.023 09/08/2017 2333   PHURINE 5.0 09/08/2017 2333   GLUCOSEU NEGATIVE 09/08/2017 2333   HGBUR NEGATIVE 09/08/2017 2333   BILIRUBINUR NEGATIVE 09/08/2017 2333   KETONESUR NEGATIVE 09/08/2017 2333   PROTEINUR 30 (  A) 09/08/2017 2333   UROBILINOGEN 0.2 04/10/2015 1145   NITRITE POSITIVE (A) 09/08/2017 2333   LEUKOCYTESUR MODERATE (A) 09/08/2017 2333   Sepsis Labs: @LABRCNTIP (procalcitonin:4,lacticidven:4) )No results found for this or any previous visit (from the past 240 hour(s)).   Radiological Exams on Admission: Dg Chest 2 View  Result Date: 09/08/2017 CLINICAL DATA:  Syncope EXAM: CHEST  2 VIEW COMPARISON:  10/30/2015 FINDINGS: The heart size and mediastinal contours are within normal limits. Both lungs are clear. Surgical changes of the lower cervical spine. IMPRESSION: No active cardiopulmonary disease. Electronically Signed   By: Jasmine PangKim  Fujinaga M.D.   On: 09/08/2017 21:25   Ct Head Wo Contrast  Result Date: 09/08/2017 CLINICAL DATA:  66 year old female with altered mental status. EXAM: CT HEAD WITHOUT CONTRAST TECHNIQUE: Contiguous axial images were obtained from the base of the skull through the vertex without intravenous contrast. COMPARISON:  Head CT dated 10/30/2015 FINDINGS: Brain: There is minimal age-related atrophy and chronic microvascular ischemic changes. There is no acute intracranial hemorrhage. No mass effect or midline shift. No extra-axial fluid collection. Vascular: No hyperdense vessel or unexpected calcification. Skull: Normal. Negative for fracture or focal lesion. Sinuses/Orbits: No acute  finding. Other: None IMPRESSION: 1. No acute intracranial pathology. 2. Minimal age-appropriate atrophy. Electronically Signed   By: Elgie CollardArash  Radparvar M.D.   On: 09/08/2017 22:02    EKG: Independently reviewed.  Sinus tachycardia QRS of 108 ms QTC of 446 ms.  Assessment/Plan Principal Problem:   Acute encephalopathy Active Problems:   Essential hypertension   Mild dementia   Normochromic normocytic anemia   Acute lower UTI    1. Acute encephalopathy/foaming in the mouth -not sure if patient had a seizure.  Discussed with neurologist at this time recommend MRI brain and EEG.  We will closely monitor in telemetry for any abnormal rhythms since patient also has hypokalemia.  Continue with hydration. 2. UTI -patient initially was started on empiric antibiotics for possible sepsis.  Since UA shows which is consistent with UTI have placed patient on ceftriaxone.  Follow cultures. 3. Hypokalemia -not sure was causing this.  Patient received IV and p.o. replacements.  Recheck metabolic panel along with magnesium levels. 4. Normocytic normochromic anemia -appears to have dropped 5 g from previous 3 months ago.  Stool for occult blood is negative.  We will recheck CBC and if there is any further decline may need transfusion. 5. Hypertension on lisinopril and beta-blockers. 6. Dementia -presently not on any medications. 7. Acute renal failure probably from dehydration -hydrate and recheck metabolic panel.   DVT prophylaxis: We will hold Lovenox for now since patient has significant anemia until we make sure there is no bleed. Code Status: DNR. Family Communication: Patient's husband. Disposition Plan: Back to the memory unit. Consults called: None. Admission status: Observation.   Eduard ClosArshad N Oaklee Esther MD [facility] Pager [phone].  If 7PM-7AM, please contact night-coverage [email or web address] Password [email or web address]  09/09/2017, 3:20 AM

## 2017-09-09 NOTE — Consult Note (Signed)
[facility] Consult: 4:21 PM 09/09/2017  LOS: 0 days    Referring Provider: Dr Sharolyn DouglasEzenduka  Primary Care Physician:  Pearline Cablesopland, Jessica C, MD Primary Gastroenterologist:  Dr. Sabino GasserGeorge Orr in past     Reason for Consultation:  FOBT negative anemia   HPI: Tina Fitzpatrick is a 66 y.o. female. She has dementia.  Hypertension.  Hyperlipidemia.  History of elevated LFTs with fatty liver and splenomegaly on ultrasound in 2011. 11/2002 EGD with grossly normal esophagus but question of Barrett's".  Stomach and duodenum normal.  Pathology did confirm intestinal metaplasia consistent with Barrett's esophagus. 11/2002 colonoscopy.  Small polyp 10 cm from anal verge.  Pathology was hyperplastic polyp. 11/2004 pathology report: focal intestinal metaplasia consistent with Barrett's.  Benign fundic gland polyp.  Corresponding endoscopy report not found within epic.  On 12.24.18 patient entered the memory care unit.  She had become too much for her husband to handle at home and had gotten lost requiring the assistance of the police. Within the last few months her appetite has been poor and she lost weight.  Within the last month or so she has had constipation.  10-14 days ago the patient had a constipated stool which was associated with some minor to moderate bleeding which was seen in the commode water and when she wiped.  She has had some vague complaints of queasiness.  No abdominal pain complaints.  No excessive bleeding or bruising.  On 12/26 patient became unresponsive at her memory care facility and was "foaming at the mouth ".  It was no mild clonus.  Per the family patient was more confused than normal. At the time of arrival to the ER the patient was back to her mental status baseline. Labs relevant for Hgb of 7.5 and this  morning it dropped to 6.6.  This compares with a baseline of about 12.5 to13 in 06/2017.  FOBT negative.  Hypokalemia and hypomagnesemia.   Elevated lactate of 3.9.  She was treated with bolus IV fluids and empiric antibiotics for possible urosepsis, + bacteruria and TNTC WBCs.  CT head were unremarkable< brain MRI results pending. Anemia studies reveal decreased B12 level but no iron deficiency.  Lactic acid has normalized. Slight decreased renal function initially with GFR of 55 but 60 on repeat study.  BUN normal PT/INR have not been obtained.      Past Medical History:  Diagnosis Date  . DDD (degenerative disc disease), cervical   . Dementia   . Depression   . GERD (gastroesophageal reflux disease)   . Hyperlipidemia   . Hypertension   . Kidney stones     Past Surgical History:  Procedure Laterality Date  . kidney stones  1988  . NECK SURGERY  2008   Plate/Screws    Prior to Admission medications   Medication Sig Start Date End Date Taking? Authorizing Provider  ALPRAZolam Prudy Feeler(XANAX) 0.5 MG tablet Take 1 tablet (0.5 mg total) by mouth 2 (two) times daily as needed for anxiety. 11/11/15  Yes Sheliah Hatchabori, Katherine E, MD  aspirin 81 MG tablet  Take 81 mg by mouth daily.   Yes [provider]  atenolol (TENORMIN) 50 MG tablet Take 50 mg by mouth daily.   Yes [provider]  fenofibrate 160 MG tablet Take 1 tablet (160 mg total) by mouth daily. 11/02/16  Yes Copland, Gwenlyn Found, MD  folic acid (FOLVITE) 1 MG tablet Take 1 tablet (1 mg total) by mouth daily. 11/02/15  Yes Rai, Ripudeep K, MD  gabapentin (NEURONTIN) 100 MG capsule TAKE 100 mg CAPSULE BY MOUTH THREE TIMES DAILY 04/13/17  Yes [provider]  lisinopril (PRINIVIL,ZESTRIL) 5 MG tablet TAKE ONE TABLET BY MOUTH ONCE DAILY Patient taking differently: Take 5 mg by mouth daily. TAKE ONE TABLET BY MOUTH ONCE DAILY 11/02/16  Yes Copland, Gwenlyn Found, MD  metoprolol (LOPRESSOR) 50 MG tablet TAKE ONE TABLET BY MOUTH  TWICE DAILY Patient taking differently: TAKE 50 mg  TABLET BY MOUTH TWICE DAILY 11/17/16  Yes Copland, Gwenlyn Found, MD  ondansetron (ZOFRAN) 4 MG tablet Take 1 tablet (4 mg total) by mouth every 8 (eight) hours as needed for nausea or vomiting. 07/14/17  Yes Khatri, Hina, PA-C  potassium chloride SA (K-DUR,KLOR-CON) 20 MEQ tablet Take 2 tablets (40 mEq total) by mouth daily. 07/14/17 09/08/17 Yes Khatri, Hina, PA-C    Scheduled Meds: . atenolol  50 mg Oral Daily  . cyanocobalamin  1,000 mcg Intramuscular Once  . fenofibrate  160 mg Oral Daily  . folic acid  1 mg Oral Daily  . gabapentin  100 mg Oral TID  . potassium chloride SA  40 mEq Oral Daily   Infusions: . sodium chloride    . cefTRIAXone (ROCEPHIN)  IV Stopped (09/09/17 0650)   PRN Meds: acetaminophen **OR** acetaminophen (TYLENOL) oral liquid 160 mg/5 mL **OR** acetaminophen, ALPRAZolam   Allergies as of 09/08/2017  . (No Known Allergies)    Family History  Problem Relation Age of Onset  . Heart attack Mother     Social History   Socioeconomic History  . Marital status: Married    Spouse name: Not on file  . Number of children: 2  . Years of education: Not on file  . Highest education level: Not on file  Social Needs  . Financial resource strain: Not on file  . Food insecurity - worry: Not on file  . Food insecurity - inability: Not on file  . Transportation needs - medical: Not on file  . Transportation needs - non-medical: Not on file  Occupational History  . Not on file  Tobacco Use  . Smoking status: Former Smoker    Types: Cigarettes  . Smokeless tobacco: Never Used  Substance and Sexual Activity  . Alcohol use: No    Alcohol/week: 0.0 oz  . Drug use: No  . Sexual activity: Yes  Other Topics Concern  . Not on file  Social History Narrative   Married with 2 children   Right handed    12 th grade    1 cup daily    REVIEW OF SYSTEMS: Constitutional: No new fatigue or weakness. ENT:  No nose  bleeds Pulm: No shortness of breath or cough CV:  No palpitations, no LE edema.  No chest pain. GU:  No hematuria, no frequency GI: No dysphagia. Heme:  Per HPI.  No previous issues with anemia. Transfusions: No previous transfusions. Neuro:  No headaches, no peripheral tingling or numbness Derm:  No itching, no rash or sores.  Endocrine:  No sweats or chills.  No polyuria or  dysuria Immunization: Reviewed Travel:  None beyond local counties in last few months.    PHYSICAL EXAM: Vital signs in last 24 hours: Vitals:   09/09/17 1214 09/09/17 1354  BP: (!) 111/54 (!) 106/56  Pulse: 87 80  Resp: 17 18  Temp: 98.8 F (37.1 C) 98.8 F (37.1 C)  SpO2: 99% 99%   Wt Readings from Last 3 Encounters:  09/09/17 73 kg (160 lb 15 oz)  07/14/17 68 kg (150 lb)  07/08/17 68.4 kg (150 lb 12.8 oz)    General: Older WF lying quietly in the bed.  Looks comfortable.  She does not look ill. Head: No facial asymmetry or swelling.  No signs of head trauma. Eyes: No scleral icterus or conjunctival pallor. Ears: Not hard of hearing Nose: No congestion or discharge. Mouth: Moist, clear oral mucosa.  Tongue midline. Neck: No JVD.  No thyromegaly.  No masses. Lungs: Clear bilaterally.  No labored breathing or cough. Heart: RRR.  S1, S2 present.  No MRG. Abdomen: Soft.  No masses.  No HSM.  No bruits or hernias.  Active bowel sounds..   Rectal: Normal rectal tone.  Empty rectal vault.  No stool or blood on exam glove.  No masses Musc/Skeltl: No joint erythema, swelling or contracture deformities. Extremities: No CCE. Neurologic: Oriented to her name.  Not to year or place.  Follows simple commands.  Moves all 4 extremities.  No tremor. Skin: No telangiectasia, no rashes, no sores Tattoos: In the lower abdomen. Nodes: No cervical or inguinal adenopathy. Psych: Affect flat.  Intake/Output from previous day: 12/26 0701 - 12/27 0700 In: 1750 [IV Piggyback:1750] Out: -  Intake/Output this  shift: Total I/O In: 656.7 [Blood:656.7] Out: -   LAB RESULTS: Recent Labs    09/08/17 2102 09/09/17 0400  WBC 4.5 4.8  HGB 7.5* 6.6*  HCT 21.5* 18.9*  PLT 196 158   BMET Lab Results  Component Value Date   NA 140 09/08/2017   NA 143 07/14/2017   NA 143 07/08/2017   K 2.9 (L) 09/08/2017   K 3.0 (L) 07/14/2017   K 4.4 07/08/2017   CL 106 09/08/2017   CL 112 (H) 07/14/2017   CL 109 07/08/2017   CO2 20 (L) 09/08/2017   CO2 22 07/14/2017   CO2 28 07/08/2017   GLUCOSE 180 (H) 09/08/2017   GLUCOSE 133 (H) 07/14/2017   GLUCOSE 99 07/08/2017   BUN 15 09/08/2017   BUN 13 07/14/2017   BUN 10 07/08/2017   CREATININE 0.93 09/09/2017   CREATININE 1.05 (H) 09/08/2017   CREATININE 0.76 07/14/2017   CALCIUM 9.5 09/08/2017   CALCIUM 9.3 07/14/2017   CALCIUM 10.0 07/08/2017   LFT Recent Labs    09/08/17 2102  PROT 6.8  ALBUMIN 4.1  AST 39  ALT 21  ALKPHOS 40  BILITOT 2.3*   PT/INR Lab Results  Component Value Date   INR 1.16 04/10/2015   Hepatitis Panel No results for input(s): HEPBSAG, HCVAB, HEPAIGM, HEPBIGM in the last 72 hours. C-Diff No components found for: CDIFF Lipase     Component Value Date/Time   LIPASE 35 09/08/2017 2102    Drugs of Abuse     Component Value Date/Time   LABOPIA NONE DETECTED 07/06/2017 1441   COCAINSCRNUR NONE DETECTED 07/06/2017 1441   LABBENZ POSITIVE (A) 07/06/2017 1441   AMPHETMU NONE DETECTED 07/06/2017 1441   THCU NONE DETECTED 07/06/2017 1441   LABBARB NONE DETECTED 07/06/2017 1441     RADIOLOGY STUDIES: Dg  Chest 2 View  Result Date: 09/08/2017 CLINICAL DATA:  Syncope EXAM: CHEST  2 VIEW COMPARISON:  10/30/2015 FINDINGS: The heart size and mediastinal contours are within normal limits. Both lungs are clear. Surgical changes of the lower cervical spine. IMPRESSION: No active cardiopulmonary disease. Electronically Signed   By: Jasmine Pang M.D.   On: 09/08/2017 21:25   Ct Head Wo Contrast  Result Date:  09/08/2017 CLINICAL DATA:  66 year old female with altered mental status. EXAM: CT HEAD WITHOUT CONTRAST TECHNIQUE: Contiguous axial images were obtained from the base of the skull through the vertex without intravenous contrast. COMPARISON:  Head CT dated 10/30/2015 FINDINGS: Brain: There is minimal age-related atrophy and chronic microvascular ischemic changes. There is no acute intracranial hemorrhage. No mass effect or midline shift. No extra-axial fluid collection. Vascular: No hyperdense vessel or unexpected calcification. Skull: Normal. Negative for fracture or focal lesion. Sinuses/Orbits: No acute finding. Other: None IMPRESSION: 1. No acute intracranial pathology. 2. Minimal age-appropriate atrophy. Electronically Signed   By: Elgie Collard M.D.   On: 09/08/2017 22:02     IMPRESSION:   *  FOBT negative anemia.  However did have a single witnessed episode of blood after a hard to pass constipated stool within the last couple of weeks.  The volume of blood described does not explain 5 gm decline over 8 weeks.  2 U PRBCs ordered and transfusing today. B12 deficiency being addressed with IM injection.  *  Hx barretts esophagus.  Last EGD was in 2006.  No dysphagia.  *  Dementia.  Acute AMS changes with ? Seizure.  Head CT non-acute.   Was just placed into a memory care unit from home on 12/24  *  ? Urosepsis.  Dirty U/A and urine, blood clx pending.  Rocephin in place.      PLAN:     *  Dr Myrtie Neither seeing pt and will make decisions regarding work up.    *  Adding colace for constipation.      Jennye Moccasin  09/09/2017, 4:21 PM Pager: [phone]  I have reviewed the entire case in detail with the above APP and discussed the plan in detail.  Therefore, I agree with the diagnoses recorded above. In addition,  I have personally interviewed and examined the patient and have personally reviewed any abdominal/pelvic CT scan images.  My additional thoughts are as follows:  Normocytic  anemia with normal iron and folate levels and heme negative. B12 level low, which appears to be the culprit. We have no further testing planned, and will sign off. Spoke with [facility] attending.    Charlie Pitter III Pager [phone]  Mon-Fri 8a-5p [phone] after 5p, weekends, holidays

## 2017-09-09 NOTE — Progress Notes (Signed)
EEG complete - results pending 

## 2017-09-09 NOTE — Procedures (Signed)
ELECTROENCEPHALOGRAM REPORT  Date of Study: 09/09/2017  Patient's Name: Lambert ModyJanet L Wetherby MRN: [id number] Date of Birth: [birth year]  Referring Provider: Dr. Midge MiniumArshad Kakrakandy  Clinical History: This is a 66 year old woman with an episode of loss of consciousness.   Medications: Gabapentin Acetaminophen Alprazolam Atenolol Ceftriaxone fenofibrate Lisinopril  Technical Summary: A multichannel digital EEG recording measured by the international 10-20 system with electrodes applied with paste and impedances below 5000 ohms performed as portable with EKG monitoring in an awake and drowsy patient.  Hyperventilation and photic stimulation were not performed.  The digital EEG was referentially recorded, reformatted, and digitally filtered in a variety of bipolar and referential montages for optimal display.   Description: The patient is awake and drowsy during the recording. She is noted to be confused. During maximal wakefulness, there is a symmetric, medium voltage 8-9 Hz posterior dominant rhythm that poorly attenuates with eye opening and eye closure. This is admixed with a small amount of diffuse 5-6 Hz theta slowing of the waking background.  During drowsiness, there is an increase in theta slowing of the background. Deeper stages of sleep were not seen. Hyperventilation and photic stimulation were not performed.  There were no epileptiform discharges or electrographic seizures seen.    EKG lead was unremarkable.  Impression: This awake and drowsy EEG is abnormal due to mild diffuse slowing of the waking background.  Clinical Correlation of the above findings indicates diffuse cerebral dysfunction that is non-specific in etiology and can be seen with hypoxic/ischemic injury, toxic/metabolic encephalopathies, neurodegenerative disorders, or medication effect.  The absence of epileptiform discharges does not rule out a clinical diagnosis of epilepsy.  Clinical correlation is  advised.   Patrcia DollyKaren Aquino, M.D.

## 2017-09-10 ENCOUNTER — Inpatient Hospital Stay (HOSPITAL_COMMUNITY): Payer: Medicare Other

## 2017-09-10 DIAGNOSIS — N390 Urinary tract infection, site not specified: Secondary | ICD-10-CM

## 2017-09-10 DIAGNOSIS — R4182 Altered mental status, unspecified: Secondary | ICD-10-CM

## 2017-09-10 DIAGNOSIS — E872 Acidosis: Secondary | ICD-10-CM

## 2017-09-10 DIAGNOSIS — I361 Nonrheumatic tricuspid (valve) insufficiency: Secondary | ICD-10-CM

## 2017-09-10 DIAGNOSIS — I10 Essential (primary) hypertension: Secondary | ICD-10-CM

## 2017-09-10 LAB — CBC WITH DIFFERENTIAL/PLATELET
Basophils Absolute: 0.1 10*3/uL (ref 0.0–0.1)
Basophils Relative: 1 %
EOS ABS: 0.1 10*3/uL (ref 0.0–0.7)
EOS PCT: 1 %
HCT: 31.7 % — ABNORMAL LOW (ref 36.0–46.0)
Hemoglobin: 10.7 g/dL — ABNORMAL LOW (ref 12.0–15.0)
LYMPHS ABS: 1.6 10*3/uL (ref 0.7–4.0)
LYMPHS PCT: 28 %
MCH: 29.5 pg (ref 26.0–34.0)
MCHC: 33.8 g/dL (ref 30.0–36.0)
MCV: 87.3 fL (ref 78.0–100.0)
MONO ABS: 0.4 10*3/uL (ref 0.1–1.0)
MONOS PCT: 6 %
Neutro Abs: 3.7 10*3/uL (ref 1.7–7.7)
Neutrophils Relative %: 64 %
PLATELETS: 190 10*3/uL (ref 150–400)
RBC: 3.63 MIL/uL — AB (ref 3.87–5.11)
RDW: 17.3 % — AB (ref 11.5–15.5)
WBC: 5.8 10*3/uL (ref 4.0–10.5)

## 2017-09-10 LAB — TYPE AND SCREEN
ABO/RH(D): O POS
ANTIBODY SCREEN: NEGATIVE
UNIT DIVISION: 0
UNIT DIVISION: 0

## 2017-09-10 LAB — BASIC METABOLIC PANEL
Anion gap: 10 (ref 5–15)
BUN: 8 mg/dL (ref 6–20)
CO2: 23 mmol/L (ref 22–32)
CREATININE: 1.06 mg/dL — AB (ref 0.44–1.00)
Calcium: 9.2 mg/dL (ref 8.9–10.3)
Chloride: 110 mmol/L (ref 101–111)
GFR calc Af Amer: >60 mL/min (ref >60)
GFR, EST NON AFRICAN AMERICAN: 54 mL/min — AB (ref >60)
GLUCOSE: 96 mg/dL (ref 65–99)
POTASSIUM: 3.2 mmol/L — AB (ref 3.5–5.1)
SODIUM: 143 mmol/L (ref 135–145)

## 2017-09-10 LAB — ECHOCARDIOGRAM COMPLETE
Height: 66 in
Weight: 2574.97 oz

## 2017-09-10 LAB — BPAM RBC
BLOOD PRODUCT EXPIRATION DATE: 201901202359
BLOOD PRODUCT EXPIRATION DATE: 201901202359
ISSUE DATE / TIME: 201812270836
ISSUE DATE / TIME: 201812271405
UNIT TYPE AND RH: 5100
Unit Type and Rh: 5100

## 2017-09-10 LAB — MAGNESIUM: MAGNESIUM: 2.4 mg/dL (ref 1.7–2.4)

## 2017-09-10 MED ORDER — ENOXAPARIN SODIUM 40 MG/0.4ML ~~LOC~~ SOLN
40.0000 mg | SUBCUTANEOUS | Status: DC
  Administered 2017-09-10 – 2017-09-11 (×2): 40 mg via SUBCUTANEOUS
  Filled 2017-09-10 (×2): qty 0.4

## 2017-09-10 MED ORDER — LORAZEPAM 2 MG/ML IJ SOLN
1.0000 mg | INTRAMUSCULAR | Status: DC | PRN
  Administered 2017-09-10: 1 mg via INTRAVENOUS
  Filled 2017-09-10: qty 1

## 2017-09-10 MED ORDER — CYANOCOBALAMIN 1000 MCG/ML IJ SOLN: 1000.0000 ug | Freq: Every morning | INTRAMUSCULAR | Status: DC

## 2017-09-10 MED ORDER — HALOPERIDOL LACTATE 5 MG/ML IJ SOLN
2.5000 mg | Freq: Once | INTRAMUSCULAR | Status: AC
  Administered 2017-09-10: 2.5 mg via INTRAVENOUS
  Filled 2017-09-10: qty 1

## 2017-09-10 MED ORDER — CYANOCOBALAMIN 1000 MCG/ML IJ SOLN
1000.0000 ug | Freq: Once | INTRAMUSCULAR | Status: AC
  Administered 2017-09-11: 1000 ug via INTRAMUSCULAR
  Filled 2017-09-10: qty 1

## 2017-09-10 NOTE — Progress Notes (Signed)
Pt's husband back on unit and in patient's room with patient. NT Joni Reiningicole will continue to check on patient but not necessary to sit with patient at the moment.

## 2017-09-10 NOTE — Care Management Note (Signed)
Case Management Note  Patient Details  Name: Tina ModyJanet L Feigel MRN: [id number] Date of Birth: Feb 12, 1951  Subjective/Objective:  [facility]                   Action/Plan: Awaiting PT/OT recommendations. MRI pending. CM following for d/c disposition.   Expected Discharge Date:                  Expected Discharge Plan:  Assisted Living / Rest Home  In-House Referral:  Clinical Social Work  Discharge planning Services     Post Acute Care Choice:    Choice offered to:     DME Arranged:    DME Agency:     HH Arranged:    HH Agency:     Status of Service:  In process, will continue to follow  If discussed at Long Length of Stay Meetings, dates discussed:    Additional Comments:  Kermit BaloKelli F Remo Kirschenmann, RN 09/10/2017, 10:30 AM

## 2017-09-10 NOTE — Progress Notes (Signed)
Pt has not needed restraint during day shift.

## 2017-09-10 NOTE — Progress Notes (Addendum)
PROGRESS NOTE    Tina Fitzpatrick  ZOX:[id number] DOB: 06/21/1951 DOA: 09/08/2017 PCP: Pearline Cablesopland, Jessica C, MD  Brief Narrative: Tina Fitzpatrick is a 66 y.o. female with history of advanced dementia was recently placed in skilled nursing facility memory unit 3 days ago last evening was found to have suddenly slumped onto her seat while watching TV and foaming on her mouth.  Patient's husband was called and patient was brought to the ER.  Patient has been states by the time patient came to the ER patient was back to her baseline.  Normally is exactly witnessed episode and not known if patient had any seizure-like activity or has had loss consciousness. In ED, noted to have severe anemia and B12 defi  Assessment & Plan: 1. Suspected Syncope - unclear if seizure vs true syncope  -admitting MD d/w Neurology, recommended MRI brain -pending, EEG-done-unremarkable -Tele without acute events, in NSR in 70-80range -Anemia-transfused and K low with replace -check 2D ECHO  2. Possible UTI vs asymptomatic bacteruria -due to dementia, unable to be certain about symptoms -Tx with Abx for 3days, cx with 40K colonies of Ecoli  3. Hypokalemia -replace  4. Normocytic normochromic anemia -appears to have dropped 5 g from previous 3 months ago.  Stool for occult blood is negative, anemia panel unremarkable except low B12       -transfused 2units PRBC, B12 started replacement       -GI recommended no further workup at this time       -last endoscopies, 11/2002 colonoscopy.  Small polyp-hyperplastic polyp.       -EGD 11/2004 path: focal intestinal metaplasia consistent with Barrett's.  Benign fundic gland polyp  5. Hypertension -cotninue atenolol, hold ACE  6. Dementia -presently not on any medications, currently residing in memory care unit  7. Acute renal failure probably from dehydration -resolved with IVF and holding ACE.   DVT prophylaxis: add lovenox Code Status: DNR. Family Communication: none  at bedside Disposition Plan: Back to the memory unit, tomorrow if stable     Consultants:   D/w NEuro   Procedures:   Antimicrobials:    Subjective: Feels ok, no complaints intermittently agitated and wants to go home  Objective: Vitals:   09/09/17 1717 09/09/17 2105 09/10/17 0300 09/10/17 0500  BP: 116/64 (!) 142/63 138/71 (!) 129/55  Pulse: 69 65 93 100  Resp: 16 16 20 20   Temp: 99.4 F (37.4 C) 98.5 F (36.9 C) 99.7 F (37.6 C) 99.1 F (37.3 C)  TempSrc: Axillary Oral Oral Oral  SpO2: 99% 98% 99% 97%  Weight:      Height:        Intake/Output Summary (Last 24 hours) at 09/10/2017 1201 Last data filed at 09/10/2017 0315 Gross per 24 hour  Intake 866.25 ml  Output -  Net 866.25 ml   Filed Weights   09/08/17 2027 09/09/17 0242  Weight: 73 kg (161 lb) 73 kg (160 lb 15 oz)    Examination:  General exam: Appears calm and comfortable, orinetd to self and partly to place Respiratory system: Clear to auscultation. Respiratory effort normal. Cardiovascular system: S1 & S2 heard, RRR. No JVD, murmurs, rubs, gallops  Gastrointestinal system: Abdomen is nondistended, soft and nontender.Normal bowel sounds heard. Central nervous system: Alert and oriented. No focal neurological deficits. Extremities: Symmetric 5 x 5 power. Skin: No rashes, lesions or ulcers     Data Reviewed:   CBC: Recent Labs  Lab 09/08/17 2102 09/09/17 0400 09/09/17 2006 09/10/17 0320  WBC 4.5 4.8 5.0 5.8  NEUTROABS 3.2  --   --  3.7  HGB 7.5* 6.6* 9.9* 10.7*  HCT 21.5* 18.9* 28.6* 31.7*  MCV 89.6 89.6 86.9 87.3  PLT 196 158 166 190   Basic Metabolic Panel: Recent Labs  Lab 09/08/17 2102 09/09/17 0400 09/09/17 0600 09/09/17 2006 09/10/17 0320  NA 140  --   --  141 143  K 2.9*  --   --  3.2* 3.2*  CL 106  --   --  111 110  CO2 20*  --   --  22 23  GLUCOSE 180*  --   --  129* 96  BUN 15  --   --  7 8  CREATININE 1.05* 0.93  --  1.01* 1.06*  CALCIUM 9.5  --   --  8.7*  9.2  MG  --   --  <0.1* 2.5* 2.4   GFR: Estimated Creatinine Clearance: 54.1 mL/min (A) (by C-G formula based on SCr of 1.06 mg/dL (H)). Liver Function Tests: Recent Labs  Lab 09/08/17 2102  AST 39  ALT 21  ALKPHOS 40  BILITOT 2.3*  PROT 6.8  ALBUMIN 4.1   Recent Labs  Lab 09/08/17 2102  LIPASE 35   No results for input(s): AMMONIA in the last 168 hours. Coagulation Profile: No results for input(s): INR, PROTIME in the last 168 hours. Cardiac Enzymes: No results for input(s): CKTOTAL, CKMB, CKMBINDEX, TROPONINI in the last 168 hours. BNP (last 3 results) No results for input(s): PROBNP in the last 8760 hours. HbA1C: No results for input(s): HGBA1C in the last 72 hours. CBG: Recent Labs  Lab 09/09/17 0810  GLUCAP 136*   Lipid Profile: No results for input(s): CHOL, HDL, LDLCALC, TRIG, CHOLHDL, LDLDIRECT in the last 72 hours. Thyroid Function Tests: No results for input(s): TSH, T4TOTAL, FREET4, T3FREE, THYROIDAB in the last 72 hours. Anemia Panel: Recent Labs    09/09/17 0400  VITAMINB12 165*  FOLATE 32.0  FERRITIN 419*  TIBC 361  IRON 90  RETICCTPCT 6.1*   Urine analysis:    Component Value Date/Time   COLORURINE AMBER (A) 09/08/2017 2333   APPEARANCEUR CLOUDY (A) 09/08/2017 2333   LABSPEC 1.023 09/08/2017 2333   PHURINE 5.0 09/08/2017 2333   GLUCOSEU NEGATIVE 09/08/2017 2333   HGBUR NEGATIVE 09/08/2017 2333   BILIRUBINUR NEGATIVE 09/08/2017 2333   KETONESUR NEGATIVE 09/08/2017 2333   PROTEINUR 30 (A) 09/08/2017 2333   UROBILINOGEN 0.2 04/10/2015 1145   NITRITE POSITIVE (A) 09/08/2017 2333   LEUKOCYTESUR MODERATE (A) 09/08/2017 2333   Sepsis Labs: @LABRCNTIP (procalcitonin:4,lacticidven:4)  ) Recent Results (from the past 240 hour(s))  Blood Culture (routine x 2)     Status: None (Preliminary result)   Collection Time: 09/08/17  9:14 PM  Result Value Ref Range Status   Specimen Description BLOOD RIGHT ANTECUBITAL  Final   Special Requests    Final    IN BOTH AEROBIC AND ANAEROBIC BOTTLES Blood Culture adequate volume   Culture NO GROWTH < 24 HOURS  Final   Report Status PENDING  Incomplete  Blood Culture (routine x 2)     Status: None (Preliminary result)   Collection Time: 09/08/17  9:19 PM  Result Value Ref Range Status   Specimen Description BLOOD BLOOD RIGHT FOREARM  Final   Special Requests   Final    IN BOTH AEROBIC AND ANAEROBIC BOTTLES Blood Culture adequate volume   Culture NO GROWTH < 24 HOURS  Final   Report Status PENDING  Incomplete  Urine culture     Status: Abnormal (Preliminary result)   Collection Time: 09/08/17 11:40 PM  Result Value Ref Range Status   Specimen Description URINE, CATHETERIZED  Final   Special Requests Normal  Final   Culture 40,000 COLONIES/mL GRAM NEGATIVE RODS (A)  Final   Report Status PENDING  Incomplete         Radiology Studies: Dg Chest 2 View  Result Date: 09/08/2017 CLINICAL DATA:  Syncope EXAM: CHEST  2 VIEW COMPARISON:  10/30/2015 FINDINGS: The heart size and mediastinal contours are within normal limits. Both lungs are clear. Surgical changes of the lower cervical spine. IMPRESSION: No active cardiopulmonary disease. Electronically Signed   By: Jasmine Pang M.D.   On: 09/08/2017 21:25   Ct Head Wo Contrast  Result Date: 09/08/2017 CLINICAL DATA:  66 year old female with altered mental status. EXAM: CT HEAD WITHOUT CONTRAST TECHNIQUE: Contiguous axial images were obtained from the base of the skull through the vertex without intravenous contrast. COMPARISON:  Head CT dated 10/30/2015 FINDINGS: Brain: There is minimal age-related atrophy and chronic microvascular ischemic changes. There is no acute intracranial hemorrhage. No mass effect or midline shift. No extra-axial fluid collection. Vascular: No hyperdense vessel or unexpected calcification. Skull: Normal. Negative for fracture or focal lesion. Sinuses/Orbits: No acute finding. Other: None IMPRESSION: 1. No acute  intracranial pathology. 2. Minimal age-appropriate atrophy. Electronically Signed   By: Elgie Collard M.D.   On: 09/08/2017 22:02        Scheduled Meds: . atenolol  50 mg Oral Daily  . docusate sodium  100 mg Oral Daily  . enoxaparin (LOVENOX) injection  40 mg Subcutaneous Q24H  . fenofibrate  160 mg Oral Daily  . folic acid  1 mg Oral Daily  . gabapentin  100 mg Oral TID  . potassium chloride SA  40 mEq Oral Daily   Continuous Infusions: . cefTRIAXone (ROCEPHIN)  IV 1 g (09/10/17 1044)     LOS: 1 day    Time spent:    Zannie Cove, MD [facility] Page via [email or web address], password [email or web address] After 7PM please contact night-coverage  09/10/2017, 12:01 PM

## 2017-09-10 NOTE — Progress Notes (Signed)
  Echocardiogram 2D Echocardiogram has been performed.  Tina SkeenVijay  Tina Fitzpatrick 09/10/2017, 4:49 PM

## 2017-09-10 NOTE — Progress Notes (Addendum)
Patient not able to follow commands, short term memory is an issue, husband was in room with patient and was going to stay over night but he left without letting anyone know he was leaving. Brought patient out to nurses station so we can watch her as she would not stay in the bed and pulled her IV out. Will continue to monitor.

## 2017-09-10 NOTE — Progress Notes (Signed)
Contacted MD Jomarie LongsJoseph and asked about possible restraints in case necessary for patient. MD aware of pt's situation and had previously visited with patient in the room. MD stated would put order in for restraints to be available if necessary but expressly verbalized that restraints should only be used as a last resort.

## 2017-09-10 NOTE — Progress Notes (Signed)
PT Cancellation Note  Patient Details Name: Tina ModyJanet L Fitzpatrick MRN: [id number] DOB: 18-Mar-1951   Cancelled Treatment:    Reason Eval/Treat Not Completed: Other (comment). Pt out of the room. PT will continue to f/u with pt as available.    Alessandra BevelsJennifer M Moksh Loomer 09/10/2017, 1:51 PM

## 2017-09-11 MED ORDER — ALPRAZOLAM 0.5 MG PO TABS: 0.5000 mg | ORAL_TABLET | Freq: Two times a day (BID) | ORAL | 3 refills | Status: DC | PRN

## 2017-09-11 MED ORDER — CYANOCOBALAMIN 1000 MCG/ML IJ SOLN: 1000.0000 ug | INTRAMUSCULAR | 0 refills | Status: DC

## 2017-09-11 NOTE — Discharge Summary (Signed)
Physician Discharge Summary  Tina ModyJanet L Fitzpatrick [id number] DOB: 1950-10-25 DOA: 09/08/2017  PCP: Pearline Cablesopland, Jessica C, MD  Admit date: 09/08/2017 Discharge date: 09/11/2017  Time spent: 45 minutes  Recommendations for Outpatient Follow-up:  1. Continue B12 replacement as outlined below, recheck B12 level in 2months 2. PCP in 1 week, please monitor BP   Discharge Diagnoses:    Possible syncope   B12 deficiency   Essential hypertension   Mild dementia   Normochromic normocytic anemia  Discharge Condition: stable  Diet recommendation: heart healthy  Filed Weights   09/08/17 2027 09/09/17 0242  Weight: 73 kg (161 lb) 73 kg (160 lb 15 oz)    History of present illness:  Tina JarredJanet L Johnsonis a 66 y.o.femalewithhistory of advanced dementia was recently placed in skilled nursing facility memory unit 3 days ago last evening was found to have suddenly slumped onto her seat while watching TV andfoamingon her mouth. Patient's husband was called and patient was brought to the ER. Patient has been states by the time patient came to the ER patient was back to her baseline  Hospital Course:  1. Suspected Syncope -unclear if seizure vs true syncope  -admitting MD d/w Neurology, recommended MRI brain -unremarkable, EEG-done-unremarkable -Tele without acute events, in NSR in 70-80range -Anemia-transfused and K low which was replaced -2D ECHO noted normal EF and wall motion -lisinopril stopped at DC  2. Possible UTIvs asymptomatic bacteruria -due to dementia, unable to be certain about symptoms -Treated with Abx for 3days, cultures grew 40K colonies of Ecoli  3. Hypokalemia -replaced  4. Normocytic normochromic anemia-appears to have dropped 5 g from previous 3 months ago. Stool for occult blood is negative, anemia panel unremarkable except low B12       -transfused 2units PRBC, B12 started replacement       -GI recommended no further workup at this time       -last  endoscopies, 11/2002 colonoscopy.Small polyp-hyperplastic polyp.       -EGD 11/2004 path:focal intestinal metaplasia consistent with Barrett's. Benign fundic gland polyp  5. Hypertension -continue atenolol, stopped ACE  6. Dementia-presently not on any medications, currently residing in memory care unit  7. Acute renal failure probably from dehydration-resolved with IVF and holding ACE.  Code Status:DNR.    Consultations: GI Discharge Exam: Vitals:   09/10/17 2140 09/11/17 0500  BP: 127/69 122/68  Pulse: (!) 107 89  Resp: 18 16  Temp: 98.4 F (36.9 C) 97.7 F (36.5 C)  SpO2: 98% 98%    General: AAOx2 Cardiovascular: S1S2/RRR Respiratory: CTAB  Discharge Instructions   Discharge Instructions    Diet - low sodium heart healthy   Complete by:  As directed    Increase activity slowly   Complete by:  As directed      Allergies as of 09/11/2017   No Known Allergies     Medication List    STOP taking these medications   lisinopril 5 MG tablet Commonly known as:  PRINIVIL,ZESTRIL   metoprolol tartrate 50 MG tablet Commonly known as:  LOPRESSOR   ondansetron 4 MG tablet Commonly known as:  ZOFRAN     TAKE these medications   ALPRAZolam 0.5 MG tablet Commonly known as:  XANAX Take 1 tablet (0.5 mg total) by mouth 2 (two) times daily as needed for anxiety.   aspirin 81 MG tablet Take 81 mg by mouth daily.   atenolol 50 MG tablet Commonly known as:  TENORMIN Take 50 mg by mouth daily.   cyanocobalamin  1000 MCG/ML injection Commonly known as:  (VITAMIN B-12) Inject 1 mL (1,000 mcg total) into the muscle once a week. Weekly for 4 weeks and then once a month, please check B12 level in 6weeks   fenofibrate 160 MG tablet Take 1 tablet (160 mg total) by mouth daily.   folic acid 1 MG tablet Commonly known as:  FOLVITE Take 1 tablet (1 mg total) by mouth daily.   gabapentin 100 MG capsule Commonly known as:  NEURONTIN TAKE 100 mg CAPSULE BY  MOUTH THREE TIMES DAILY   potassium chloride SA 20 MEQ tablet Commonly known as:  K-DUR,KLOR-CON Take 2 tablets (40 mEq total) by mouth daily.      No Known Allergies Follow-up Information    Copland, Gwenlyn Found, MD. Schedule an appointment as soon as possible for a visit in 1 week(s).   Specialty:  Family Medicine Contact information: [address] [place] [address] [phone]            The results of significant diagnostics from this hospitalization (including imaging, microbiology, ancillary and laboratory) are listed below for reference.    Significant Diagnostic Studies: Dg Chest 2 View  Result Date: 09/08/2017 CLINICAL DATA:  Syncope EXAM: CHEST  2 VIEW COMPARISON:  10/30/2015 FINDINGS: The heart size and mediastinal contours are within normal limits. Both lungs are clear. Surgical changes of the lower cervical spine. IMPRESSION: No active cardiopulmonary disease. Electronically Signed   By: Jasmine Pang M.D.   On: 09/08/2017 21:25   Ct Head Wo Contrast  Result Date: 09/08/2017 CLINICAL DATA:  66 year old female with altered mental status. EXAM: CT HEAD WITHOUT CONTRAST TECHNIQUE: Contiguous axial images were obtained from the base of the skull through the vertex without intravenous contrast. COMPARISON:  Head CT dated 10/30/2015 FINDINGS: Brain: There is minimal age-related atrophy and chronic microvascular ischemic changes. There is no acute intracranial hemorrhage. No mass effect or midline shift. No extra-axial fluid collection. Vascular: No hyperdense vessel or unexpected calcification. Skull: Normal. Negative for fracture or focal lesion. Sinuses/Orbits: No acute finding. Other: None IMPRESSION: 1. No acute intracranial pathology. 2. Minimal age-appropriate atrophy. Electronically Signed   By: Elgie Collard M.D.   On: 09/08/2017 22:02   Mr Brain Wo Contrast  Result Date: 09/10/2017 CLINICAL DATA:  Altered level of consciousness.  Possible  seizure EXAM: MRI HEAD WITHOUT CONTRAST TECHNIQUE: Multiplanar, multiecho pulse sequences of the brain and surrounding structures were obtained without intravenous contrast. COMPARISON:  CT head 09/08/2017 FINDINGS: Brain: Ventricle size and cerebral volume normal for age. Negative for infarct, hemorrhage, or mass lesion. Vascular: Normal arterial flow void Skull and upper cervical spine: Negative Sinuses/Orbits: Mild mucosal edema left mastoid sinus. Paranasal sinuses clear. Normal orbit. Other: Non IMPRESSION: Normal for age MRI of the head without contrast Small left mastoid sinus effusion. Electronically Signed   By: Marlan Palau M.D.   On: 09/10/2017 14:41    Microbiology: Recent Results (from the past 240 hour(s))  Blood Culture (routine x 2)     Status: None (Preliminary result)   Collection Time: 09/08/17  9:14 PM  Result Value Ref Range Status   Specimen Description BLOOD RIGHT ANTECUBITAL  Final   Special Requests   Final    IN BOTH AEROBIC AND ANAEROBIC BOTTLES Blood Culture adequate volume   Culture NO GROWTH 2 DAYS  Final   Report Status PENDING  Incomplete  Blood Culture (routine x 2)     Status: None (Preliminary result)  Collection Time: 09/08/17  9:19 PM  Result Value Ref Range Status   Specimen Description BLOOD BLOOD RIGHT FOREARM  Final   Special Requests   Final    IN BOTH AEROBIC AND ANAEROBIC BOTTLES Blood Culture adequate volume   Culture NO GROWTH 2 DAYS  Final   Report Status PENDING  Incomplete  Urine culture     Status: Abnormal (Preliminary result)   Collection Time: 09/08/17 11:40 PM  Result Value Ref Range Status   Specimen Description URINE, CATHETERIZED  Final   Special Requests Normal  Final   Culture 40,000 COLONIES/mL GRAM NEGATIVE RODS (A)  Final   Report Status PENDING  Incomplete     Labs: Basic Metabolic Panel: Recent Labs  Lab 09/08/17 2102 09/09/17 0400 09/09/17 0600 09/09/17 2006 09/10/17 0320  NA 140  --   --  141 143  K 2.9*  --    --  3.2* 3.2*  CL 106  --   --  111 110  CO2 20*  --   --  22 23  GLUCOSE 180*  --   --  129* 96  BUN 15  --   --  7 8  CREATININE 1.05* 0.93  --  1.01* 1.06*  CALCIUM 9.5  --   --  8.7* 9.2  MG  --   --  <0.1* 2.5* 2.4   Liver Function Tests: Recent Labs  Lab 09/08/17 2102  AST 39  ALT 21  ALKPHOS 40  BILITOT 2.3*  PROT 6.8  ALBUMIN 4.1   Recent Labs  Lab 09/08/17 2102  LIPASE 35   No results for input(s): AMMONIA in the last 168 hours. CBC: Recent Labs  Lab 09/08/17 2102 09/09/17 0400 09/09/17 2006 09/10/17 0320  WBC 4.5 4.8 5.0 5.8  NEUTROABS 3.2  --   --  3.7  HGB 7.5* 6.6* 9.9* 10.7*  HCT 21.5* 18.9* 28.6* 31.7*  MCV 89.6 89.6 86.9 87.3  PLT 196 158 166 190   Cardiac Enzymes: No results for input(s): CKTOTAL, CKMB, CKMBINDEX, TROPONINI in the last 168 hours. BNP: BNP (last 3 results) No results for input(s): BNP in the last 8760 hours.  ProBNP (last 3 results) No results for input(s): PROBNP in the last 8760 hours.  CBG: Recent Labs  Lab 09/09/17 0810  GLUCAP 136*       Signed:  Zannie CovePreetha Conall Vangorder MD.  [facility] 09/11/2017, 8:30 AM

## 2017-09-11 NOTE — NC FL2 (Signed)
[place] MEDICAID FL2 LEVEL OF CARE SCREENING TOOL     IDENTIFICATION  Patient Name: Tina Fitzpatrick Birthdate: 04/28/1951 Sex: female Admission Date (Current Location): 09/08/2017  [place]   Facility and Address:  [facility], [address], [place], [place] [address]      Provider Number: [id number]  Attending Physician Name and Address:  Zannie CoveJoseph, Preetha, MD  Relative Name and Phone Number:       Current Level of Care: Hospital Recommended Level of Care: Memory Care([facility]) Prior Approval Number:    Date Approved/Denied:   PASRR Number:    Discharge Plan: Other (Comment)(Memory Care Unit)    Current Diagnoses: Patient Active Problem List   Diagnosis Date Noted  . Normochromic normocytic anemia 09/09/2017  . Acute lower UTI 09/09/2017  . Acute encephalopathy 09/08/2017  . 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 Orthostatic hypotension 10/30/2015  . Mild dementia 07/29/2015  . Encephalopathy, metabolic 06/24/2015  . Syncope 06/22/2013  . GERD (gastroesophageal reflux disease)   . Essential hypertension   . Hyperlipidemia   . Depression   . DDD (degenerative disc disease), cervical     Orientation RESPIRATION BLADDER Height & Weight     Self, Time  Normal Continent Weight: 160 lb 15 oz (73 kg) Height:  5\' 6"  (167.6 cm)  BEHAVIORAL SYMPTOMS/MOOD NEUROLOGICAL BOWEL NUTRITION STATUS      Continent (Please see d/c summary)  AMBULATORY STATUS COMMUNICATION OF NEEDS Skin   Supervision Verbally Normal                       Personal Care Assistance Level of Assistance  Bathing, Feeding, Dressing Bathing Assistance: Independent Feeding assistance: Independent Dressing Assistance: Independent     Functional Limitations Info  Sight, Hearing, Speech Sight Info: Adequate Hearing Info: Adequate Speech Info: Adequate    SPECIAL CARE FACTORS FREQUENCY                       Contractures Contractures Info: Not present    Additional Factors Info  Code Status, Allergies Code Status Info: DNR Allergies Info: No known allergies           Current Medications (09/11/2017):  This is the current hospital active medication list Current Facility-Administered Medications  Medication Dose Route Frequency Provider Last Rate Last Dose  . acetaminophen (TYLENOL) tablet 650 mg  650 mg Oral Q4H PRN Eduard ClosKakrakandy, Arshad N, MD       Or  . acetaminophen (TYLENOL) solution 650 mg  650 mg Per Tube Q4H PRN Eduard ClosKakrakandy, Arshad N, MD       Or  . acetaminophen (TYLENOL) suppository 650 mg  650 mg Rectal Q4H PRN Eduard ClosKakrakandy, Arshad N, MD      . ALPRAZolam Prudy Feeler(XANAX) tablet 0.5 mg  0.5 mg Oral BID PRN Eduard ClosKakrakandy, Arshad N, MD   0.5 mg at 09/10/17 2239  . atenolol (TENORMIN) tablet 50 mg  50 mg Oral Daily Eduard ClosKakrakandy, Arshad N, MD   50 mg at 09/11/17 1035  . cefTRIAXone (ROCEPHIN) 1 g in dextrose 5 % 50 mL IVPB  1 g Intravenous Daily Eduard ClosKakrakandy, Arshad N, MD 100 mL/hr at 09/11/17 1042 1 g at 09/11/17 1042  . docusate sodium (COLACE) capsule 100 mg  100 mg Oral Daily Dianah FieldGribbin, Sarah J, PA-C   100 mg at 09/11/17 1035  . enoxaparin (LOVENOX) injection 40 mg  40 mg Subcutaneous  Q24H Zannie CoveJoseph, Preetha, MD   40 mg at 09/11/17 [phone]  . fenofibrate tablet 160 mg  160 mg Oral Daily Eduard ClosKakrakandy, Arshad N, MD   160 mg at 09/11/17 1036  . folic acid (FOLVITE) tablet 1 mg  1 mg Oral Daily Eduard ClosKakrakandy, Arshad N, MD   1 mg at 09/11/17 1035  . gabapentin (NEURONTIN) capsule 100 mg  100 mg Oral TID Eduard ClosKakrakandy, Arshad N, MD   100 mg at 09/11/17 1035  . LORazepam (ATIVAN) injection 1 mg  1 mg Intravenous PRN Zannie CoveJoseph, Preetha, MD   1 mg at 09/10/17 1300  . potassium chloride SA (K-DUR,KLOR-CON) CR tablet 40 mEq  40 mEq Oral Daily Eduard ClosKakrakandy, Arshad N, MD   40 mEq at 09/11/17 1035     Discharge Medications: Please see discharge summary for a list of discharge medications.  Relevant Imaging  Results:  Relevant Lab Results:   Additional Information SSN: [id number]  Maree KrabbeBridget A Guido Comp, LCSW

## 2017-09-11 NOTE — Progress Notes (Signed)
[facility]  Pt. Has been emotional all day and continues to be emotional about not returning home. Nurse addressed pt. Concern and explained to pt that [facility] will be [facility] so they can take good care of her.  Transported Via [facility]. All Belongings and discharge papers sent with transport.

## 2017-09-11 NOTE — Clinical Social Work Note (Signed)
CSW spoke with RN via telephone. [facility]. Dc is complete. CSW will contact facility and set up transport.   [place]get Marijayne Rauth, [place] [phone]

## 2017-09-11 NOTE — Progress Notes (Signed)
Pt rested for several hours after receiving xanax dose prior to bedtime, no restraints required this shift. Redirectable to activity. VSS, denies pain. Patient safety maintained.

## 2017-09-11 NOTE — Evaluation (Signed)
Physical Therapy Evaluation Patient Details Name: Tina Fitzpatrick MRN: [id number] DOB: 1950-11-20 Today's Date: 09/11/2017   History of Present Illness  66 y.o. female with history of advanced dementia was recently placed in skilled nursing facility memory unit 3 days ago last evening was found to have suddenly slumped onto her seat while watching TV and foaming on her mouth  Clinical Impression  Orders received for PT evaluation. Patient demonstrates very modest deficits in functional mobility as indicated below. Appears to be at functional baseline, no further acute PT needs recommend return to memory care unit.      Follow Up Recommendations (return to Memory care unit)    Equipment Recommendations  None recommended by PT    Recommendations for Other Services       Precautions / Restrictions Precautions Precautions: Fall Restrictions Weight Bearing Restrictions: No      Mobility  Bed Mobility               General bed mobility comments: received in chair  Transfers Overall transfer level: Needs assistance Equipment used: None Transfers: Sit to/from Stand Sit to Stand: Supervision         General transfer comment: supervision for safety no physical assist required  Ambulation/Gait Ambulation/Gait assistance: Supervision Ambulation Distance (Feet): 310 Feet Assistive device: None Gait Pattern/deviations: WFL(Within Functional Limits)     General Gait Details: ocassional instability but able to self correct  Stairs Stairs: Yes Stairs assistance: Supervision Stair Management: Two rails Number of Stairs: 5 General stair comments: supervision for safety  Wheelchair Mobility    Modified Rankin (Stroke Patients Only)       Balance Overall balance assessment: Needs assistance   Sitting balance-Leahy Scale: Good       Standing balance-Leahy Scale: Good               High level balance activites: Side stepping;Backward walking;Direction  changes;Turns;Sudden stops;Head turns High Level Balance Comments: supervision for safety, some instability noted with higer level tasks, one incident of reaching for support but otherwise able to self correct. No physical assist from therapist required             Pertinent Vitals/Pain Pain Assessment: No/denies pain    Home Living Family/patient expects to be discharged to:: Skilled nursing facility(memory care resident)                      Prior Function Level of Independence: Needs assistance   Gait / Transfers Assistance Needed: supervision for safety           Hand Dominance   Dominant Hand: Right    Extremity/Trunk Assessment   Upper Extremity Assessment Upper Extremity Assessment: Overall WFL for tasks assessed    Lower Extremity Assessment Lower Extremity Assessment: Overall WFL for tasks assessed       Communication   Communication: No difficulties  Cognition Arousal/Alertness: Awake/alert Behavior During Therapy: WFL for tasks assessed/performed Overall Cognitive Status: History of cognitive impairments - at baseline                                        General Comments      Exercises     Assessment/Plan    PT Assessment Patent does not need any further PT services  PT Problem List         PT Treatment Interventions  PT Goals (Current goals can be found in the Care Plan section)  Acute Rehab PT Goals PT Goal Formulation: All assessment and education complete, DC therapy    Frequency     Barriers to discharge        Co-evaluation               AM-PAC PT "6 Clicks" Daily Activity  Outcome Measure Difficulty turning over in bed (including adjusting bedclothes, sheets and blankets)?: None Difficulty moving from lying on back to sitting on the side of the bed? : None Difficulty sitting down on and standing up from a chair with arms (e.g., wheelchair, bedside commode, etc,.)?: None Help needed  moving to and from a bed to chair (including a wheelchair)?: A Little Help needed walking in hospital room?: A Little   6 Click Score: 18    End of Session Equipment Utilized During Treatment: Gait belt Activity Tolerance: Patient tolerated treatment well Patient left: in chair;with nursing/sitter in room Nurse Communication: Mobility status PT Visit Diagnosis: Difficulty in walking, not elsewhere classified (R26.2)    Time: [id number] PT Time Calculation (min) (ACUTE ONLY): 11 min   Charges:   PT Evaluation $PT Eval Low Complexity: 1 Low     PT G Codes:   PT G-Codes **NOT FOR INPATIENT CLASS** Functional Assessment Tool Used: Clinical judgement Functional Limitation: Mobility: Walking and moving around Mobility: Walking and Moving Around Current Status ([id number](G8978): At least 1 percent but less than 20 percent impaired, limited or restricted Mobility: Walking and Moving Around Goal Status [phone](G8979): At least 1 percent but less than 20 percent impaired, limited or restricted Mobility: Walking and Moving Around Discharge Status [phone](G8980): At least 1 percent but less than 20 percent impaired, limited or restricted    Tina Fitzpatrick, PT DPT  Board Certified Neurologic Specialist [phone]   Fabio AsaDevon J Iantha Titsworth 09/11/2017, 9:09 AM

## 2017-09-11 NOTE — Clinical Social Work Note (Addendum)
Clinical Social Worker facilitated patient discharge including contacting patient family (spouse) and facility to confirm patient discharge plans.  Pt's spouse confirmed pt will need transport by [facility]. Clinical information faxed to facility and family agreeable with plan.  CSW arranged ambulance [facility](Memory Care).  RN to call [phone] for report prior to discharge.  Clinical Social Worker will sign off for now as social work intervention is no longer needed. Please consult us again if new need arises.  [place]get Giancarlo Fitzpatrick, [place] [phone]

## 2017-09-11 NOTE — Progress Notes (Signed)
OT Evaluation  PTA pt recently moved into a memory care unit. Pt appropriate to DC back to memory care unit. Any further OT needs can be [facility]. Acute OT signing off.     09/11/17 1100  OT Visit Information  Last OT Received On 09/11/17  Assistance Needed +1  History of Present Illness 66 y.o. female with history of advanced dementia was recently placed in skilled nursing facility memory unit 3 days ago last evening was found to have suddenly slumped onto her seat while watching TV and foaming on her mouth  Precautions  Precautions Fall  Restrictions  Weight Bearing Restrictions No  Home Living  Family/patient expects to be discharged to: Skilled nursing facility (memory care resident)  Prior Function  Level of Independence Needs assistance  Gait / Transfers Assistance Needed supervision for safety  ADL's / Homemaking Assistance Needed S  Communication  Communication Expressive difficulties (word finding at times)  Cognition  Arousal/Alertness Awake/alert  Behavior During Therapy [facility] for tasks assessed/performed  Overall Cognitive Status History of cognitive impairments - at baseline (appear at baseline)  Upper Extremity Assessment  Upper Extremity Assessment Overall WFL for tasks assessed  Lower Extremity Assessment  Lower Extremity Assessment Overall WFL for tasks assessed  Cervical / Trunk Assessment  Cervical / Trunk Assessment Normal  ADL  Overall ADL's  At baseline; Pt does well with structured tasks and simple 1 step commands  Bed Mobility  General bed mobility comments received in chair  Transfers  Overall transfer level Needs assistance  Equipment used None  Transfers Sit to/from Stand  Sit to Stand Supervision  General transfer comment supervision for safety no physical assist required  Balance  Overall balance assessment Needs assistance  Sitting balance-Leahy Scale Good  Standing balance-Leahy Scale Good  OT - End of Session   Equipment Utilized During Treatment Gait belt  Activity Tolerance Patient tolerated treatment well  Patient left in chair;with call bell/phone within reach;with nursing/sitter in room  Nurse Communication Mobility status  OT Assessment  OT Recommendation/Assessment Patient does not need any further OT services  OT Visit Diagnosis Unsteadiness on feet (R26.81);Other symptoms and signs involving cognitive function  OT Problem List Decreased cognition  AM-PAC OT "6 Clicks" Daily Activity Outcome Measure  Help from another person eating meals? 4  Help from another person taking care of personal grooming? 4  Help from another person toileting, which includes using toliet, bedpan, or urinal? 3  Help from another person bathing (including washing, rinsing, drying)? 3  Help from another person to put on and taking off regular upper body clothing? 4  Help from another person to put on and taking off regular lower body clothing? 3  6 Click Score 21  ADL G Code Conversion CJ  OT Recommendation  Follow Up Recommendations Supervision/Assistance - 24 hour  OT Equipment None recommended by OT  Acute Rehab OT Goals  Patient Stated Goal to go home  OT Goal Formulation All assessment and education complete, DC therapy  OT Time Calculation  OT Start Time (ACUTE ONLY) 0837  OT Stop Time (ACUTE ONLY) 0853  OT Time Calculation (min) 16 min  OT General Charges  $OT Visit 1 Visit  OT Evaluation  $OT Eval Low Complexity 1 Low  Written Expression  Dominant Hand Right  [facility] Christen, OT/L  [phone] 09/11/2017

## 2017-09-12 LAB — URINE CULTURE: SPECIAL REQUESTS: NORMAL

## 2017-09-13 LAB — CULTURE, BLOOD (ROUTINE X 2)
CULTURE: NO GROWTH
Culture: NO GROWTH

## 2017-09-15 DIAGNOSIS — R55 Syncope and collapse: Secondary | ICD-10-CM | POA: Diagnosis not present

## 2017-09-15 DIAGNOSIS — I10 Essential (primary) hypertension: Secondary | ICD-10-CM | POA: Diagnosis not present

## 2017-09-15 DIAGNOSIS — D519 Vitamin B12 deficiency anemia, unspecified: Secondary | ICD-10-CM | POA: Diagnosis not present

## 2017-09-22 DIAGNOSIS — D649 Anemia, unspecified: Secondary | ICD-10-CM | POA: Diagnosis not present

## 2017-09-22 DIAGNOSIS — N179 Acute kidney failure, unspecified: Secondary | ICD-10-CM | POA: Diagnosis not present

## 2017-09-22 DIAGNOSIS — I10 Essential (primary) hypertension: Secondary | ICD-10-CM | POA: Diagnosis not present

## 2017-10-04 DIAGNOSIS — H6123 Impacted cerumen, bilateral: Secondary | ICD-10-CM | POA: Diagnosis not present

## 2017-10-04 DIAGNOSIS — I10 Essential (primary) hypertension: Secondary | ICD-10-CM | POA: Diagnosis not present

## 2017-10-06 ENCOUNTER — Telehealth: Payer: Self-pay | Admitting: *Deleted

## 2017-10-06 NOTE — Telephone Encounter (Signed)
Received Home Health Plan of Care; forwarded to provider/SLS 01/23   

## 2017-10-13 DIAGNOSIS — I10 Essential (primary) hypertension: Secondary | ICD-10-CM | POA: Diagnosis not present

## 2017-10-13 DIAGNOSIS — D649 Anemia, unspecified: Secondary | ICD-10-CM | POA: Diagnosis not present

## 2017-10-18 DIAGNOSIS — R300 Dysuria: Secondary | ICD-10-CM | POA: Diagnosis not present

## 2017-10-18 DIAGNOSIS — I10 Essential (primary) hypertension: Secondary | ICD-10-CM | POA: Diagnosis not present

## 2017-11-01 DIAGNOSIS — H6120 Impacted cerumen, unspecified ear: Secondary | ICD-10-CM | POA: Diagnosis not present

## 2017-11-17 DIAGNOSIS — D519 Vitamin B12 deficiency anemia, unspecified: Secondary | ICD-10-CM | POA: Diagnosis not present

## 2017-11-17 DIAGNOSIS — D509 Iron deficiency anemia, unspecified: Secondary | ICD-10-CM | POA: Diagnosis not present

## 2017-11-17 DIAGNOSIS — D649 Anemia, unspecified: Secondary | ICD-10-CM | POA: Diagnosis not present

## 2017-11-22 DIAGNOSIS — H6120 Impacted cerumen, unspecified ear: Secondary | ICD-10-CM | POA: Diagnosis not present

## 2017-12-14 NOTE — Progress Notes (Addendum)
[facility] [address] [place], [place] [address] [phone]  Date:  12/15/2017   Name:  Tina Fitzpatrick   DOB:  [birth year]   MRN:  [id number]  PCP:  Pearline Cables, MD    Chief Complaint: No chief complaint on file.   History of Present Illness:  Tina Fitzpatrick is a 67 y.o. very pleasant female patient who presents with the following:  I have not seen this pt in a few months- from most recent hospital discharge from December:  History of present illness:  Tina Brownley Johnsonis a 67 y.o.femalewithhistory of advanced dementia was recently placed in skilled nursing facility memory unit 3 days ago last evening was found to have suddenly slumped onto her seat while watching TV andfoamingon her mouth. Patient's husband was called and patient was brought to the ER. Patient has been states by the time patient came to the ER patient was back to her baseline  Hospital Course:  1. Suspected Syncope -unclear if seizure vs true syncope  -admitting MD d/w Neurology,recommendedMRI brain -unremarkable,EEG-done-unremarkable -Tele without acute events, in NSR in 70-80range -Anemia-transfused and K low which was replaced -2D ECHO noted normal EF and wall motion -lisinopril stopped at DC 2. PossibleUTIvs asymptomatic bacteruria -due to dementia, unable to be certain about symptoms -Treated with Abx for 3days, cultures grew 40K colonies of Ecoli 3. Hypokalemia -replaced 4. Normocytic normochromic anemia-appears to have dropped 5 g from previous 3 months ago. Stool for occult blood is negative, anemia panel unremarkable except low B12 -transfused 2units PRBC, B12 started replacement -GI recommended no further workup at this time -last endoscopies, 11/2002 colonoscopy.Small polyp-hyperplastic polyp. -EGD3/2006path:focal intestinal metaplasia consistent with Barrett's. Benign fundic gland  polyp 5. Hypertension-continue atenolol, stopped ACE 6. Dementia-presently not on any medications, currently residing in memory care unit 7. Acute renal failure probably from dehydration-resolved with IVF and holding ACE.  Tina Fitzpatrick went to [facility] memory care on 12/24 and was there for a few months However it seems that she was too high functioning to be there- her husband signed her out last week and she came home to live with her husband again. He works, and the concern was her being home alone during the day- Her daughter Tina Fitzpatrick is staying with her during the day.  All is working out well so far She needs some supervision to make sure she does not get lost or leave the stove on, etc, but otherwise is doing very well and is quite pleasant and cooperative They do need me to refill her medication as she has just a limited supply from [facility].  We are glad to do this today She also needs a mammogram- able to arrange this for today  It also appears that she needs an outpt colonoscopy as per hospital note above- her colonoscopy is overdue  Her med list from [facility] includes  Xanax 0.25 BID Gabapentin 100 daily Metoprolol 50 daily- likely should be BID Asa 81 Potassium- the dose here is not clear to me Lisinopril 5 mg Folic acid Fenofibrate 160 daily Debrox drops prn  NCCSR: no recnet fill of xanax. Her last fill was for 1 mg so dose is lower  BP Readings from Last 3 Encounters:  12/15/17 102/72  09/11/17 127/64  07/14/17 129/76    Patient Active Problem List   Diagnosis Date Noted  . Normochromic normocytic anemia 09/09/2017  . Acute lower UTI 09/09/2017  .  Acute encephalopathy 09/08/2017  . 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 Orthostatic hypotension 10/30/2015  . Mild dementia 07/29/2015  . Encephalopathy, metabolic 06/24/2015  . Syncope 06/22/2013  . GERD (gastroesophageal reflux disease)   . Essential hypertension    . Hyperlipidemia   . Depression   . DDD (degenerative disc disease), cervical     Past Medical History:  Diagnosis Date  . DDD (degenerative disc disease), cervical   . Dementia   . Depression   . GERD (gastroesophageal reflux disease)   . Hyperlipidemia   . Hypertension   . Kidney stones     Past Surgical History:  Procedure Laterality Date  . kidney stones  1988  . NECK SURGERY  2008   Plate/Screws    Social History   Tobacco Use  . Smoking status: Former Smoker    Types: Cigarettes  . Smokeless tobacco: Never Used  Substance Use Topics  . Alcohol use: No    Alcohol/week: 0.0 oz  . Drug use: No    Family History  Problem Relation Age of Onset  . Heart attack Mother     No Known Allergies  Medication list has been reviewed and updated.  Current Outpatient Medications on File Prior to Visit  Medication Sig Dispense Refill  . ALPRAZolam (XANAX) 0.5 MG tablet Take 1 tablet (0.5 mg total) by mouth 2 (two) times daily as needed for anxiety. 10 tablet 3  . aspirin 81 MG tablet Take 81 mg by mouth daily.    Marland Kitchen. atenolol (TENORMIN) 50 MG tablet Take 50 mg by mouth daily.    . cyanocobalamin (,VITAMIN B-12,) 1000 MCG/ML injection Inject 1 mL (1,000 mcg total) into the muscle once a week. Weekly for 4 weeks and then once a month, please check B12 level in 6weeks 1 mL 0  . fenofibrate 160 MG tablet Take 1 tablet (160 mg total) by mouth daily. 90 tablet 3  . folic acid (FOLVITE) 1 MG tablet Take 1 tablet (1 mg total) by mouth daily. 30 tablet 4  . gabapentin (NEURONTIN) 100 MG capsule TAKE 100 mg CAPSULE BY MOUTH THREE TIMES DAILY    . potassium chloride SA (K-DUR,KLOR-CON) 20 MEQ tablet Take 2 tablets (40 mEq total) by mouth daily. 6 tablet 0   No current facility-administered medications on file prior to visit.     Review of Systems:  As per HPI- otherwise negative.   Physical Examination: Vitals:   12/15/17 1148  BP: 102/72  Pulse: 73  Temp: 98 F (36.7 C)   SpO2: 97%   Vitals:   12/15/17 1148  Weight: 154 lb 6.4 oz (70 kg)  Height: 5\' 3"  (1.6 m)   Body mass index is 27.35 kg/m. Ideal Body Weight: Weight in (lb) to have BMI = 25: 140.8  GEN: WDWN, NAD, Non-toxic, A & O x 3 HEENT: Atraumatic, Normocephalic. Neck supple. No masses, No LAD. Ears and Nose: No external deformity. CV: RRR, No M/G/R. No JVD. No thrill. No extra heart sounds. PULM: CTA B, no wheezes, crackles, rhonchi. No retractions. No resp. distress. No accessory muscle use. ABD: S, NT, ND, +BS. No rebound. No HSM. EXTR: No c/c/e NEURO Normal gait.  PSYCH: Normally interactive. Conversant. Not depressed or anxious appearing.  Calm demeanor.    Assessment and Plan: History of anemia - Plan: Comprehensive metabolic panel  History of hypokalemia - Plan: Comprehensive metabolic panel  Essential hypertension - Plan: CBC, lisinopril (PRINIVIL,ZESTRIL) 5 MG tablet, metoprolol tartrate (  LOPRESSOR) 50 MG tablet  Screening for breast cancer - Plan: MM SCREENING BREAST TOMO BILATERAL  Hyperlipidemia, unspecified hyperlipidemia type - Plan: fenofibrate 160 MG tablet  DDD (degenerative disc disease), cervical - Plan: gabapentin (NEURONTIN) 100 MG capsule  GAD (generalized anxiety disorder) - Plan: ALPRAZolam (XANAX) 0.25 MG tablet  Following up today- she has left memory care and is back home.  Doing well so far Needs her meds refilled which we took care of today, except for her potassium as I do not understand the dosage.  They will check what they have at home and update me mammo today Will plan to follow-up here in 4 months Likely will refer to GI- await CBC.  Noted to be anemic in December Signed Abbe Amsterdam, MD  Received labs, letter to pt Results for orders placed or performed in visit on 12/15/17  Comprehensive metabolic panel  Result Value Ref Range   Sodium 143 135 - 145 mEq/L   Potassium 4.5 3.5 - 5.1 mEq/L   Chloride 106 96 - 112 mEq/L   CO2 30 19 - 32  mEq/L   Glucose, Bld 112 (H) 70 - 99 mg/dL   BUN 16 6 - 23 mg/dL   Creatinine, Ser 1.61 0.40 - 1.20 mg/dL   Total Bilirubin 0.5 0.2 - 1.2 mg/dL   Alkaline Phosphatase 41 39 - 117 U/L   AST 13 0 - 37 U/L   ALT 9 0 - 35 U/L   Total Protein 6.9 6.0 - 8.3 g/dL   Albumin 4.0 3.5 - 5.2 g/dL   Calcium 09.6 8.4 - 04.5 mg/dL   GFR 40.98 >11.91 mL/min  CBC  Result Value Ref Range   WBC 6.4 4.0 - 10.5 K/uL   RBC 4.09 3.87 - 5.11 Mil/uL   Platelets 227.0 150.0 - 400.0 K/uL   Hemoglobin 12.7 12.0 - 15.0 g/dL   HCT 47.8 29.5 - 62.1 %   MCV 92.3 78.0 - 100.0 fl   MCHC 33.6 30.0 - 36.0 g/dL   RDW 30.8 65.7 - 84.6 %

## 2017-12-15 ENCOUNTER — Encounter: Payer: Self-pay | Admitting: Family Medicine

## 2017-12-15 ENCOUNTER — Ambulatory Visit (INDEPENDENT_AMBULATORY_CARE_PROVIDER_SITE_OTHER): Payer: Medicare Other | Admitting: Family Medicine

## 2017-12-15 VITALS — BP 102/72 | HR 73 | Temp 98.0°F | Ht 63.0 in | Wt 154.4 lb

## 2017-12-15 DIAGNOSIS — Z1239 Encounter for other screening for malignant neoplasm of breast: Secondary | ICD-10-CM

## 2017-12-15 DIAGNOSIS — Z862 Personal history of diseases of the blood and blood-forming organs and certain disorders involving the immune mechanism: Secondary | ICD-10-CM | POA: Diagnosis not present

## 2017-12-15 DIAGNOSIS — F411 Generalized anxiety disorder: Secondary | ICD-10-CM | POA: Diagnosis not present

## 2017-12-15 DIAGNOSIS — E785 Hyperlipidemia, unspecified: Secondary | ICD-10-CM | POA: Diagnosis not present

## 2017-12-15 DIAGNOSIS — I10 Essential (primary) hypertension: Secondary | ICD-10-CM | POA: Diagnosis not present

## 2017-12-15 DIAGNOSIS — Z1231 Encounter for screening mammogram for malignant neoplasm of breast: Secondary | ICD-10-CM

## 2017-12-15 DIAGNOSIS — M5030 Other cervical disc degeneration, unspecified cervical region: Secondary | ICD-10-CM

## 2017-12-15 DIAGNOSIS — Z8639 Personal history of other endocrine, nutritional and metabolic disease: Secondary | ICD-10-CM | POA: Diagnosis not present

## 2017-12-15 LAB — CBC
HCT: 37.8 % (ref 36.0–46.0)
Hemoglobin: 12.7 g/dL (ref 12.0–15.0)
MCHC: 33.6 g/dL (ref 30.0–36.0)
MCV: 92.3 fl (ref 78.0–100.0)
Platelets: 227 10*3/uL (ref 150.0–400.0)
RBC: 4.09 Mil/uL (ref 3.87–5.11)
RDW: 13.9 % (ref 11.5–15.5)
WBC: 6.4 10*3/uL (ref 4.0–10.5)

## 2017-12-15 LAB — COMPREHENSIVE METABOLIC PANEL
ALBUMIN: 4 g/dL (ref 3.5–5.2)
ALK PHOS: 41 U/L (ref 39–117)
ALT: 9 U/L (ref 0–35)
AST: 13 U/L (ref 0–37)
BILIRUBIN TOTAL: 0.5 mg/dL (ref 0.2–1.2)
BUN: 16 mg/dL (ref 6–23)
CALCIUM: 10 mg/dL (ref 8.4–10.5)
CO2: 30 mEq/L (ref 19–32)
CREATININE: 0.79 mg/dL (ref 0.40–1.20)
Chloride: 106 mEq/L (ref 96–112)
GFR: 77.52 mL/min (ref >60.00)
Glucose, Bld: 112 mg/dL — ABNORMAL HIGH (ref 70–99)
Potassium: 4.5 mEq/L (ref 3.5–5.1)
Sodium: 143 mEq/L (ref 135–145)
TOTAL PROTEIN: 6.9 g/dL (ref 6.0–8.3)

## 2017-12-15 MED ORDER — ALPRAZOLAM 0.25 MG PO TABS: 0.2500 mg | ORAL_TABLET | Freq: Two times a day (BID) | ORAL | 1 refills | Status: DC

## 2017-12-15 MED ORDER — METOPROLOL TARTRATE 50 MG PO TABS: 50.0000 mg | ORAL_TABLET | Freq: Two times a day (BID) | ORAL | 3 refills | Status: DC

## 2017-12-15 MED ORDER — LISINOPRIL 5 MG PO TABS: 5.0000 mg | ORAL_TABLET | Freq: Every day | ORAL | 3 refills | Status: DC

## 2017-12-15 MED ORDER — GABAPENTIN 100 MG PO CAPS: 100.0000 mg | ORAL_CAPSULE | Freq: Every day | ORAL | 3 refills | Status: DC

## 2017-12-15 MED ORDER — FENOFIBRATE 160 MG PO TABS: 160.0000 mg | ORAL_TABLET | Freq: Every day | ORAL | 3 refills | Status: DC

## 2017-12-15 NOTE — Patient Instructions (Signed)
Great to see you today- I will be in touch with your labs asap Please have your blood drawn, then go to the ground floor imaging dept to have your mammogram. It is right next to the elevators.   Assuming labs look ok let's plan to visit in about 4 months Please give me a call with the strength of the potassium pill that you have at home

## 2017-12-16 ENCOUNTER — Ambulatory Visit (HOSPITAL_BASED_OUTPATIENT_CLINIC_OR_DEPARTMENT_OTHER)
Admission: RE | Admit: 2017-12-16 | Discharge: 2017-12-16 | Disposition: A | Discharge status: Home / Self Care | Payer: Medicare Other | Source: Ambulatory Visit | Attending: Family Medicine | Admitting: Family Medicine

## 2017-12-16 ENCOUNTER — Telehealth: Payer: Self-pay | Admitting: Emergency Medicine

## 2017-12-16 DIAGNOSIS — Z1239 Encounter for other screening for malignant neoplasm of breast: Secondary | ICD-10-CM

## 2017-12-16 DIAGNOSIS — E876 Hypokalemia: Secondary | ICD-10-CM

## 2017-12-16 DIAGNOSIS — Z1231 Encounter for screening mammogram for malignant neoplasm of breast: Secondary | ICD-10-CM | POA: Insufficient documentation

## 2017-12-20 NOTE — Telephone Encounter (Signed)
Patient's husband phoned to request refill on potassium chloride SA 20 mEq, Take 2 tabs by mouth daily.   TC to patient. Mr. Laural BenesJohnson stated the patient was taking this medication and this dose [facility]. Her LOV was 12/15/17 with Dr. Shela CommonsJ. Copland. At that time, Mr. Laural BenesJohnson was asked to call back to report on the potassium his wife had been taking.  She was originally prescribed Potassium chloride SA on 07/14/17 by ER physician for Nausea with a potassium lab value of 3.0. Potassium lab value on 12/15/17 was 4.5.  Routed for consideration. Pharmacy on file.

## 2017-12-20 NOTE — Telephone Encounter (Signed)
Pt husband called to check status of RX. RX request is for potassium chloride SA (K-DUR,KLOR-CON) 20 MEQ tablet  Pt is now out - they called into pharmacy 12/16/17 - pt just came home from memory care facility [facility] Please send in.  [facility] [address] - [place], [place] - [address] [phone] (Phone) [phone] (Fax)

## 2017-12-21 NOTE — Telephone Encounter (Signed)
Pt's husband called to check on status of refill.  °

## 2017-12-21 NOTE — Telephone Encounter (Signed)
Called husband and LMOM- I don't understand why she would be on 40 meq of K long term- she is not on any diuretic- and I am concerned that this dose is still not right.  Looking back in her chart she was on 40 meq for 3 days back in October to replete low potassium.    [facility] and got transferred around a bit, finally left message on machine with memory care manager for her to call me back with dose of K she was taking while there

## 2017-12-22 MED ORDER — POTASSIUM CHLORIDE CRYS ER 10 MEQ PO TBCR: 10.0000 meq | EXTENDED_RELEASE_TABLET | Freq: Every day | ORAL | 3 refills | Status: DC

## 2017-12-22 NOTE — Telephone Encounter (Signed)
[facility]- indeed, she was on 40 meq of K the whole time she was there.  I think this may have been left over from her admission to the hospital and never re-assessed. Will have her take 10 meq and come in for a BMP in 10 days.  Discussed with her husband who states understanding and agreement with plan

## 2017-12-22 NOTE — Addendum Note (Signed)
Addended by: Abbe AmsterdamOPLAND, Ramzy Cappelletti C on: 12/22/2017 04:25 PM   Modules accepted: Orders

## 2018-01-03 ENCOUNTER — Other Ambulatory Visit: Payer: Self-pay

## 2018-01-03 NOTE — Patient Outreach (Signed)
[facility] Care Management  01/03/2018  Lambert ModyJanet L Kingston September 29, 1950 [id number]   Medication Adherence call to Mrs. Corene CorneaJanet Kingbird patient is showing past due under [facility] Ins.on Lisinopril 5 mg spoke with patients husband he said he is taking care of patient now and Mrs, Laural BenesJohnson  was at a home care facility for the past three month but now she is at home he said patient need this medication and folic acid patient is no longer [facility] more  patient using [facility] he ask if we can call the pharmacy and order both medication for her they  prefers a 90 days supply.[facility] said both medication have refill and will fill for 90 days and will be ready to be pick up.  Lillia AbedAna Ollison-Moran CPhT Pharmacy Technician [facility] Direct Dial [phone]  Fax [phone] [email or web address]

## 2018-01-05 ENCOUNTER — Telehealth: Payer: Self-pay | Admitting: Family Medicine

## 2018-01-05 DIAGNOSIS — R350 Frequency of micturition: Secondary | ICD-10-CM

## 2018-01-05 NOTE — Telephone Encounter (Signed)
Copied from CRM [phone] [phone]. Topic: Inquiry >> Jan 05, 2018  1:14 PM Maia Pettiesrtiz, Kristie S wrote: Reason for CRM: pt is having symptoms of UTI again and pt is susceptible to them. She is coming in 4/26 for BMP. Can a UA be done for pt as well? Pt husband said it would put his mind at ease.

## 2018-01-05 NOTE — Telephone Encounter (Signed)
Called her husband- sure, this is fine.  Ordered a UA and culture.  However why not come in tomorrow in case she does have a UTI so we can start treatment. He agrees

## 2018-01-06 ENCOUNTER — Other Ambulatory Visit (INDEPENDENT_AMBULATORY_CARE_PROVIDER_SITE_OTHER): Payer: Medicare Other

## 2018-01-06 DIAGNOSIS — E876 Hypokalemia: Secondary | ICD-10-CM | POA: Diagnosis not present

## 2018-01-06 DIAGNOSIS — R350 Frequency of micturition: Secondary | ICD-10-CM

## 2018-01-06 LAB — POC URINALSYSI DIPSTICK (AUTOMATED)
Bilirubin, UA: NEGATIVE
Glucose, UA: NEGATIVE
Ketones, UA: NEGATIVE
LEUKOCYTES UA: NEGATIVE
NITRITE UA: NEGATIVE
PH UA: 5.5 (ref 5.0–8.0)
PROTEIN UA: NEGATIVE
RBC UA: NEGATIVE
Spec Grav, UA: >=1.03 — AB (ref 1.010–1.025)
Urobilinogen, UA: 0.2 E.U./dL

## 2018-01-06 LAB — BASIC METABOLIC PANEL
BUN: 15 mg/dL (ref 6–23)
CALCIUM: 9.3 mg/dL (ref 8.4–10.5)
CO2: 25 mEq/L (ref 19–32)
Chloride: 108 mEq/L (ref 96–112)
Creatinine, Ser: 0.84 mg/dL (ref 0.40–1.20)
GFR: 72.2 mL/min (ref >60.00)
Glucose, Bld: 107 mg/dL — ABNORMAL HIGH (ref 70–99)
POTASSIUM: 4.1 meq/L (ref 3.5–5.1)
SODIUM: 141 meq/L (ref 135–145)

## 2018-01-06 NOTE — Addendum Note (Signed)
Addended by: Harley AltoPRICE, KRISTY M on: 01/06/2018 12:11 PM   Modules accepted: Orders

## 2018-01-07 ENCOUNTER — Other Ambulatory Visit: Payer: Medicare Other

## 2018-01-07 LAB — URINE CULTURE
MICRO NUMBER:: 90506725
SPECIMEN QUALITY: ADEQUATE

## 2018-01-08 IMAGING — CR DG CHEST 2V
2 series · 2 of 2 positions shown · non-contrast
Comparison: 10/30/2015

CLINICAL DATA: Syncope

EXAM:
CHEST  2 VIEW

[chest lat]
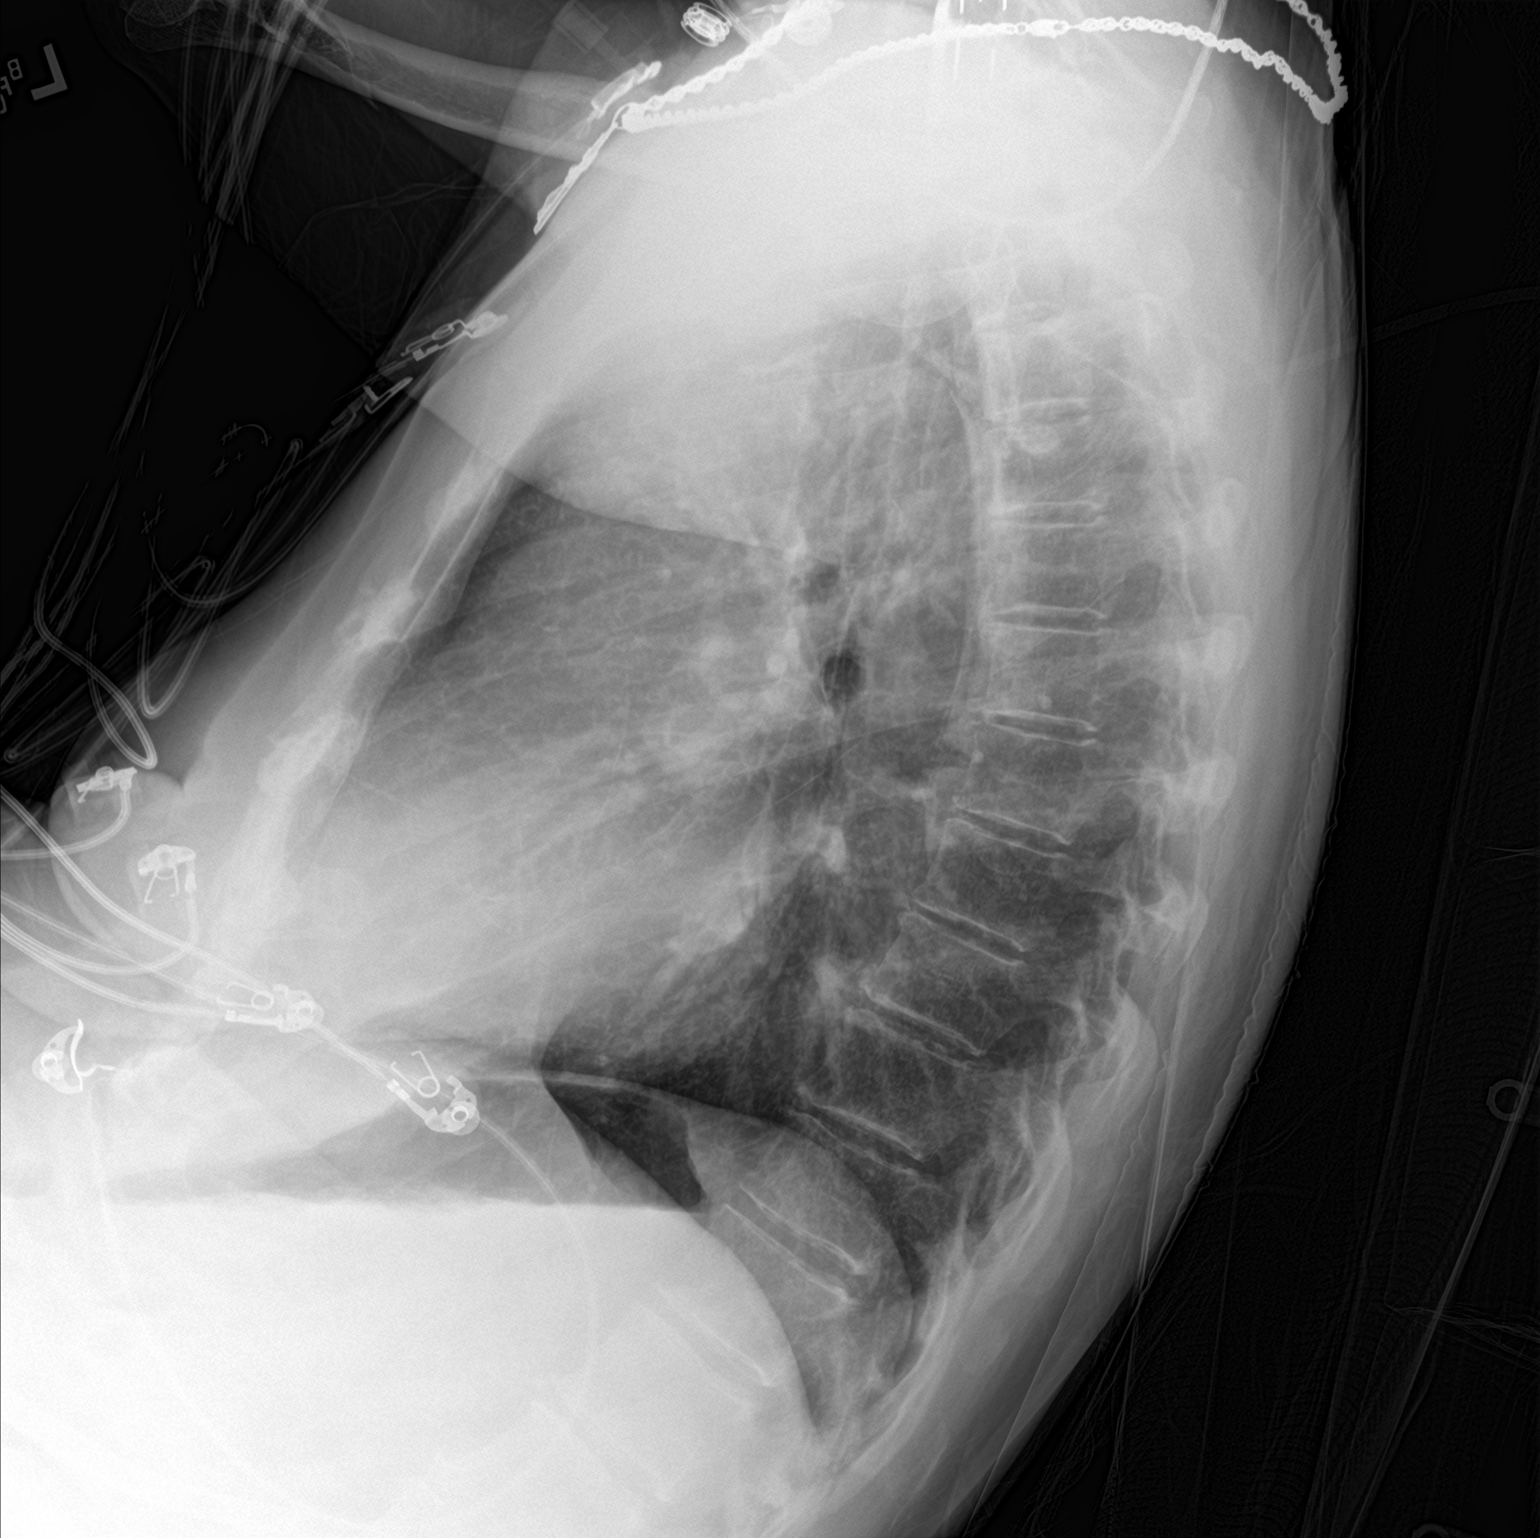

[chest ap]
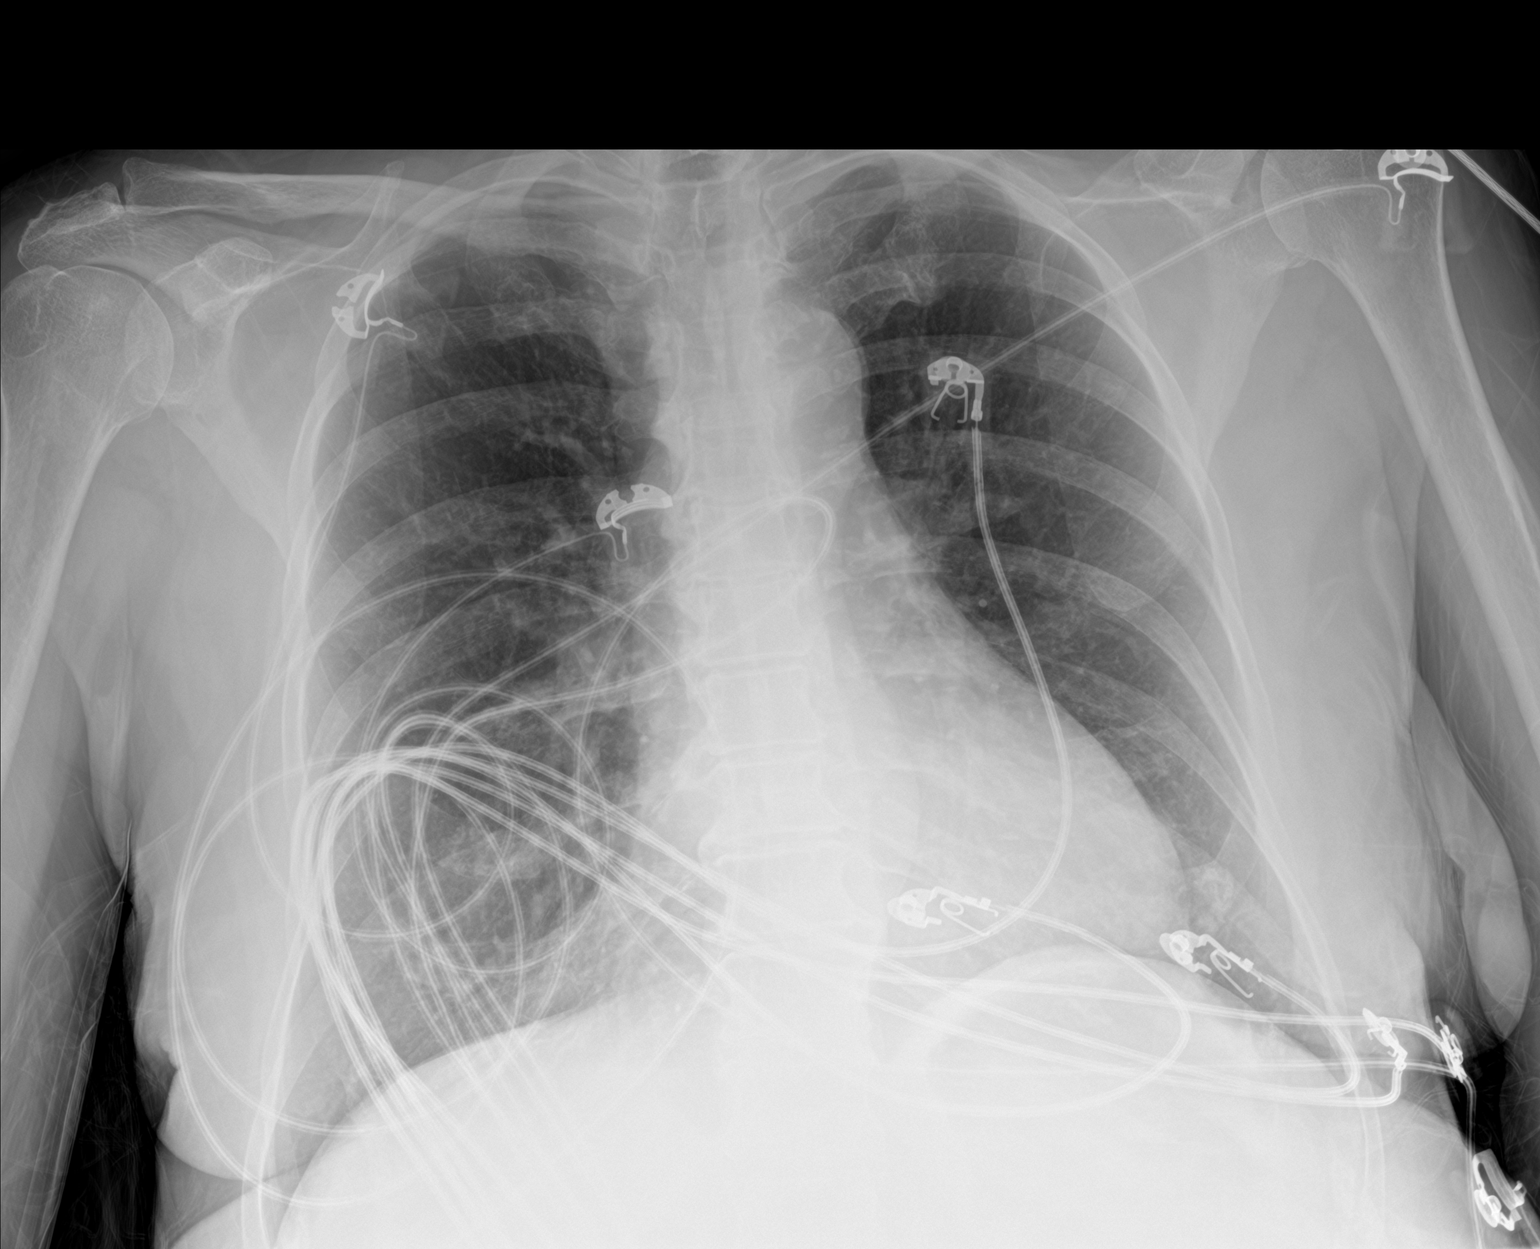

[2 of 2 positions shown; findings below may reference images not displayed]

FINDINGS: The heart size and mediastinal contours are within normal limits.
Both lungs are clear. Surgical changes of the lower cervical spine.
IMPRESSION: No active cardiopulmonary disease.

## 2018-01-09 ENCOUNTER — Telehealth: Payer: Self-pay | Admitting: Family Medicine

## 2018-01-09 NOTE — Telephone Encounter (Signed)
Received her labs and urine culture, called her husband [name].  Labs look good, K level is ok on current dose of daily.  Will continue this. They will come and see me in 4 months for recheck visit  negiatve urine culture  Results for orders placed or performed in visit on 01/06/18  Urine Culture  Result Value Ref Range   MICRO NUMBER: [id number]    SPECIMEN QUALITY: ADEQUATE    Sample Source NOT GIVEN    STATUS: FINAL    ISOLATE 1:      Three or more organisms present, each greater than 10,000 cu/mL. May represent normal flora contamination from external genitalia. No further testing is required.  Basic metabolic panel  Result Value Ref Range   Sodium 141 135 - 145 mEq/L   Potassium 4.1 3.5 - 5.1 mEq/L   Chloride 108 96 - 112 mEq/L   CO2 25 19 - 32 mEq/L   Glucose, Bld 107 (H) 70 - 99 mg/dL   BUN 15 6 - 23 mg/dL   Creatinine, Ser 9.52 0.40 - 1.20 mg/dL   Calcium 9.3 8.4 - 84.1 mg/dL   GFR 32.44 >01.02 mL/min  POCT Urinalysis Dipstick (Automated)  Result Value Ref Range   Color, UA yellow    Clarity, UA clear    Glucose, UA neg    Bilirubin, UA neg    Ketones, UA neg    Spec Grav, UA >=1.030 (A) 1.010 - 1.025   Blood, UA neg    pH, UA 5.5 5.0 - 8.0   Protein, UA neg    Urobilinogen, UA 0.2 0.2 or 1.0 E.U./dL   Nitrite, UA neg    Leukocytes, UA Negative Negative

## 2018-03-28 ENCOUNTER — Other Ambulatory Visit: Payer: Self-pay | Admitting: Pharmacist

## 2018-03-28 NOTE — Patient Outreach (Signed)
[facility]) Care Management  03/28/2018  Tina Fitzpatrick 07/29/1951 [id number]  [facility] Confirm that patient's prescription can be refilled. Pharmacy technician reports that they will have prescription ready for patient.  Duanne MoronElisabeth Jaylen Claude, PharmD, [facility] SystemBCACP Clinical Pharmacist [facility] Care Management [phone]

## 2018-03-28 NOTE — Patient Outreach (Signed)
[facility]) Care Management  03/28/2018  Lambert ModyJanet L Fitzpatrick Jan 01, 1951 [id number]   Incoming call from Mr. Tina Fitzpatrick on behalf of Mrs. Tina Fitzpatrick in response to the [facility] Medication Adherence Campaign. Call and speak with Mrs. Tina Fitzpatrick. HIPAA identifiers verified and verbal consent received. Mrs. Tina Fitzpatrick gives permission for me to call and speak with Mr. Tina Fitzpatrick regarding her medications, as she states that he manages these for her.  Call and speak with Mr. Tina Fitzpatrick again, who reports that Mrs. Tina Fitzpatrick takes her lisinopril 5 mg once daily as directed. Denies any missed doses or barriers to adherence for the patient. Reports that he is getting ready to order a refill for the patient for this medication. Denies any medication question/concerns of behalf of the patient at this time.  Will close pharmacy episode.  Duanne MoronElisabeth Alpa Salvo, PharmD, [facility]ACP Clinical Pharmacist [facility] Care Management [phone]

## 2018-04-04 ENCOUNTER — Other Ambulatory Visit: Payer: Self-pay | Admitting: Family Medicine

## 2018-04-04 NOTE — Telephone Encounter (Signed)
Copied from CRM [id number]. Topic: Quick Communication - Rx Refill/Question >> Apr 04, 2018  9:40 AM Tamela OddiHarris, Hersey Maclellan J wrote: Medication: folic acid (FOLVITE) 1 MG tablet  Patient called to request a refill for the above medication.She is completely out of medication.  CB# [phone].  Preferred Pharmacy (with phone number or street name): [facility] [address] - [place], [place] - [address] [phone] (Phone) [phone] (Fax)

## 2018-04-05 NOTE — Telephone Encounter (Signed)
Called her husband Trey PaulaJeff and [facility] - I am not sure if she still needs to be on this high dose folate.  Has she been taking it all along?  Please let me know

## 2018-04-05 NOTE — Telephone Encounter (Signed)
Please advise- last filled in 2017.

## 2018-04-05 NOTE — Telephone Encounter (Signed)
Rx refill request:   folic acid  1 mg - Rx by outside provider  LOV: 12/15/17  PCP: Copland  Pharmacy: verified

## 2018-04-24 ENCOUNTER — Other Ambulatory Visit: Payer: Self-pay | Admitting: Family Medicine

## 2018-04-24 DIAGNOSIS — E876 Hypokalemia: Secondary | ICD-10-CM

## 2018-04-27 ENCOUNTER — Other Ambulatory Visit: Payer: Self-pay | Admitting: Family Medicine

## 2018-04-27 DIAGNOSIS — E876 Hypokalemia: Secondary | ICD-10-CM

## 2018-05-24 NOTE — Progress Notes (Signed)
[facility] [address] [place], [place] [address] [phone]  Date:  05/26/2018   Name:  Tina Fitzpatrick   DOB:  25-May-1951   MRN:  [id number]  PCP:  Pearline Cables, MD    Chief Complaint: Back Pain (Here with husband. Started a few weeks ago with midback pain. No known injury. No issues with breathing. Has tried heat, ice, Tylenol, ibuprofen with little relief.)   History of Present Illness:  Tina Fitzpatrick is a 67 y.o. very pleasant female patient who presents with the following:  History of HTN, hyperlipidemia Here today with concern of back pain Last seen by myself in April- she was placed in a memory care facility for a few months but was really too high functioning to be there so she came back hom  Flu: give today Labs: due for lipids   She has noted back pain, insidious onset about 2 weeks ago NKI She has had some back pain but not this severe in the past   The pain is staying put her in back - does not go into her legs It is located in her mid thoracic back just about at the bra line and below No fever or cough She is otherwise feeling ok  No rash  They are using tylenol, ibuprofen. Heat, ice  Summers's dementia does seem to be getting worse.  Her husband is with her today and gives the majority of the history   We also discussed colon cancer screening and will set up cologuard for her today  Patient Active Problem List   Diagnosis Date Noted  . Normochromic normocytic anemia 09/09/2017  . Acute lower UTI 09/09/2017  . Acute encephalopathy 09/08/2017  . 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 Orthostatic hypotension 10/30/2015  . Mild dementia 07/29/2015  . Encephalopathy, metabolic 06/24/2015  . Syncope 06/22/2013  . GERD (gastroesophageal reflux disease)   . Essential hypertension   . Hyperlipidemia   . Depression   . DDD (degenerative disc disease),  cervical     Past Medical History:  Diagnosis Date  . DDD (degenerative disc disease), cervical   . Dementia   . Depression   . GERD (gastroesophageal reflux disease)   . Hyperlipidemia   . Hypertension   . Kidney stones     Past Surgical History:  Procedure Laterality Date  . kidney stones  1988  . NECK SURGERY  2008   Plate/Screws    Social History   Tobacco Use  . Smoking status: Former Smoker    Types: Cigarettes  . Smokeless tobacco: Never Used  Substance Use Topics  . Alcohol use: No    Alcohol/week: 0.0 standard drinks  . Drug use: No    Family History  Problem Relation Age of Onset  . Heart attack Mother     No Known Allergies  Medication list has been reviewed and updated.  Current Outpatient Medications on File Prior to Visit  Medication Sig Dispense Refill  . ALPRAZolam (XANAX) 0.25 MG tablet Take 1 tablet (0.25 mg total) by mouth 2 (two) times daily. 180 tablet 1  . aspirin 81 MG tablet Take 81 mg by mouth daily.    . fenofibrate 160 MG tablet Take 1 tablet (160 mg total) by mouth daily. 90 tablet 3  . folic acid (FOLVITE) 1 MG tablet Take 1 tablet (1 mg total) by mouth daily. 30 tablet 4  .  gabapentin (NEURONTIN) 100 MG capsule Take 1 capsule (100 mg total) by mouth daily. 90 capsule 3  . lisinopril (PRINIVIL,ZESTRIL) 5 MG tablet Take 1 tablet (5 mg total) by mouth daily. 90 tablet 3  . metoprolol tartrate (LOPRESSOR) 50 MG tablet Take 1 tablet (50 mg total) by mouth 2 (two) times daily. 180 tablet 3   No current facility-administered medications on file prior to visit.     Review of Systems:  As per HPI- otherwise negative. No fever or chills No CP or SOB   Physical Examination: Vitals:   05/26/18 1325  BP: 118/68  Pulse: 76  Temp: 98.5 F (36.9 C)  SpO2: 98%   Vitals:   05/26/18 1325  Weight: 171 lb (77.6 kg)  Height: 5\' 7"  (1.702 m)   Body mass index is 26.78 kg/m. Ideal Body Weight: Weight in (lb) to have BMI = 25:  159.3  GEN: WDWN, NAD, Non-toxic, A & O x 3, looks well.   HEENT: Atraumatic, Normocephalic. Neck supple. No masses, No LAD.  Bilateral TM wnl, oropharynx normal.  PEERL,EOMI.   Ears and Nose: No external deformity. CV: RRR, No M/G/R. No JVD. No thrill. No extra heart sounds. PULM: CTA B, no wheezes, crackles, rhonchi. No retractions. No resp. distress. No accessory muscle use. ABD: S, NT, ND EXTR: No c/c/e NEURO Normal gait.  PSYCH: Normally interactive. Conversant. Not depressed or anxious appearing.  Calm demeanor.  She indicates the mid thoracic back, midline, just at and below the bra line as the area of concern and she is tender in this area,  She also has mildly burned the skin here with her heating pad Negative straight leg raise bilaterally    Assessment and Plan: Acute midline thoracic back pain - Plan: DG Thoracic Spine 2 View, methocarbamol (ROBAXIN) 500 MG tablet, DISCONTINUED: methocarbamol (ROBAXIN) 500 MG tablet  Essential hypertension - Plan: CBC, Comprehensive metabolic panel  Hyperlipidemia, unspecified hyperlipidemia type - Plan: Lipid panel  Need for influenza vaccination - Plan: Flu Vaccine QUAD 6+ mos PF IM (Fluarix Quad PF)  Screening for colon cancer  Midline back pain She is not getting relief with her current methods at home Urged caution with heating pad, use a barrier between heating pad and her skin as she has burned herself Will obtain plain films of her T spine today robain as needed She has been on a stable low dose of xanax so will not change this  Labs pending as above as well Ordered cologuard Flu shot today  Meds ordered this encounter  Medications  . DISCONTD: methocarbamol (ROBAXIN) 500 MG tablet    Sig: Take 1 tablet (500 mg total) by mouth every 8 (eight) hours as needed for muscle spasms. And back pain    Dispense:  30 tablet    Refill:  0  . methocarbamol (ROBAXIN) 500 MG tablet    Sig: Take 1 tablet (500 mg total) by mouth every 8  (eight) hours as needed for muscle spasms. And back pain    Dispense:  30 tablet    Refill:  0     Signed Abbe Amsterdam, MD  Received her x-ray and labs as below:  Results for orders placed or performed in visit on 05/26/18  Lipid panel  Result Value Ref Range   Cholesterol 140 0 - 200 mg/dL   Triglycerides 540.0 (H) 0.0 - 149.0 mg/dL   HDL 86.76 (L) >19.50 mg/dL   VLDL 93.2 (H) 0.0 - 67.1 mg/dL   Total  CHOL/HDL Ratio 4    NonHDL 102.06   CBC  Result Value Ref Range   WBC 6.7 4.0 - 10.5 K/uL   RBC 4.32 3.87 - 5.11 Mil/uL   Platelets 239.0 150.0 - 400.0 K/uL   Hemoglobin 13.4 12.0 - 15.0 g/dL   HCT 16.1 09.6 - 04.5 %   MCV 89.7 78.0 - 100.0 fl   MCHC 34.5 30.0 - 36.0 g/dL   RDW 40.9 81.1 - 91.4 %  Comprehensive metabolic panel  Result Value Ref Range   Sodium 140 135 - 145 mEq/L   Potassium 4.0 3.5 - 5.1 mEq/L   Chloride 104 96 - 112 mEq/L   CO2 28 19 - 32 mEq/L   Glucose, Bld 134 (H) 70 - 99 mg/dL   BUN 16 6 - 23 mg/dL   Creatinine, Ser 7.82 0.40 - 1.20 mg/dL   Total Bilirubin 0.5 0.2 - 1.2 mg/dL   Alkaline Phosphatase 49 39 - 117 U/L   AST 13 0 - 37 U/L   ALT 11 0 - 35 U/L   Total Protein 7.0 6.0 - 8.3 g/dL   Albumin 4.4 3.5 - 5.2 g/dL   Calcium 95.6 8.4 - 21.3 mg/dL   GFR 08.65 >78.46 mL/min  LDL cholesterol, direct  Result Value Ref Range   Direct LDL 81.0 mg/dL   Dg Thoracic Spine 2 View  Result Date: 05/26/2018 CLINICAL DATA:  Mid back pain for 2 months, no known injury. EXAM: THORACIC SPINE 2 VIEWS COMPARISON:  Chest x-rays dated 09/08/2017 and 10/30/2015 FINDINGS: Mild levoscoliosis of the lower thoracic spine, not significantly changed compared to the chest x-ray of 09/08/2017. No acute or suspicious osseous finding. No significant degenerative change. Anterior cervical fusion hardware appears appropriately positioned within the lower cervical spine. Visualized paravertebral soft tissues are unremarkable. IMPRESSION: 1. No acute findings. 2. Mild  scoliosis of the thoracic spine. Electronically Signed   By: Bary Richard M.D.   On: 05/26/2018 14:23   Letter to pt

## 2018-05-26 ENCOUNTER — Ambulatory Visit (HOSPITAL_BASED_OUTPATIENT_CLINIC_OR_DEPARTMENT_OTHER)
Admission: RE | Admit: 2018-05-26 | Discharge: 2018-05-26 | Disposition: A | Discharge status: Home / Self Care | Payer: Medicare Other | Source: Ambulatory Visit | Attending: Family Medicine | Admitting: Family Medicine

## 2018-05-26 ENCOUNTER — Ambulatory Visit (INDEPENDENT_AMBULATORY_CARE_PROVIDER_SITE_OTHER): Payer: Medicare Other | Admitting: Family Medicine

## 2018-05-26 ENCOUNTER — Encounter: Payer: Self-pay | Admitting: Family Medicine

## 2018-05-26 VITALS — BP 118/68 | HR 76 | Temp 98.5°F | Ht 67.0 in | Wt 171.0 lb

## 2018-05-26 DIAGNOSIS — Z23 Encounter for immunization: Secondary | ICD-10-CM | POA: Diagnosis not present

## 2018-05-26 DIAGNOSIS — M546 Pain in thoracic spine: Secondary | ICD-10-CM

## 2018-05-26 DIAGNOSIS — Z1211 Encounter for screening for malignant neoplasm of colon: Secondary | ICD-10-CM

## 2018-05-26 DIAGNOSIS — E785 Hyperlipidemia, unspecified: Secondary | ICD-10-CM

## 2018-05-26 DIAGNOSIS — M419 Scoliosis, unspecified: Secondary | ICD-10-CM | POA: Diagnosis not present

## 2018-05-26 DIAGNOSIS — I10 Essential (primary) hypertension: Secondary | ICD-10-CM

## 2018-05-26 LAB — COMPREHENSIVE METABOLIC PANEL
ALBUMIN: 4.4 g/dL (ref 3.5–5.2)
ALK PHOS: 49 U/L (ref 39–117)
ALT: 11 U/L (ref 0–35)
AST: 13 U/L (ref 0–37)
BILIRUBIN TOTAL: 0.5 mg/dL (ref 0.2–1.2)
BUN: 16 mg/dL (ref 6–23)
CALCIUM: 10.2 mg/dL (ref 8.4–10.5)
CHLORIDE: 104 meq/L (ref 96–112)
CO2: 28 mEq/L (ref 19–32)
CREATININE: 0.82 mg/dL (ref 0.40–1.20)
GFR: 74.15 mL/min (ref >60.00)
Glucose, Bld: 134 mg/dL — ABNORMAL HIGH (ref 70–99)
Potassium: 4 mEq/L (ref 3.5–5.1)
Sodium: 140 mEq/L (ref 135–145)
TOTAL PROTEIN: 7 g/dL (ref 6.0–8.3)

## 2018-05-26 LAB — LIPID PANEL
CHOL/HDL RATIO: 4
CHOLESTEROL: 140 mg/dL (ref 0–200)
HDL: 37.5 mg/dL — AB (ref >39.00)
NonHDL: 102.06
Triglycerides: 240 mg/dL — ABNORMAL HIGH (ref 0.0–149.0)
VLDL: 48 mg/dL — AB (ref 0.0–40.0)

## 2018-05-26 LAB — CBC
HCT: 38.8 % (ref 36.0–46.0)
Hemoglobin: 13.4 g/dL (ref 12.0–15.0)
MCHC: 34.5 g/dL (ref 30.0–36.0)
MCV: 89.7 fl (ref 78.0–100.0)
Platelets: 239 10*3/uL (ref 150.0–400.0)
RBC: 4.32 Mil/uL (ref 3.87–5.11)
RDW: 13.1 % (ref 11.5–15.5)
WBC: 6.7 10*3/uL (ref 4.0–10.5)

## 2018-05-26 LAB — LDL CHOLESTEROL, DIRECT: LDL DIRECT: 81 mg/dL

## 2018-05-26 MED ORDER — METHOCARBAMOL 500 MG PO TABS: 500.0000 mg | ORAL_TABLET | Freq: Three times a day (TID) | ORAL | 0 refills | Status: DC | PRN

## 2018-05-26 NOTE — Patient Instructions (Signed)
It was good to see you today- I will be in touch with your labs and x-ray reports asap  We gave you some robaxin to use today as needed- this is a muscle relaxer. Ok to use with ibuprofen and/ or tylenol, but it can make you feel sleepy  Please let me know if your back is not feeling better in the next several days  We need to see you for a pap when you are feeling better

## 2018-06-30 ENCOUNTER — Other Ambulatory Visit: Payer: Self-pay | Admitting: Family Medicine

## 2018-06-30 DIAGNOSIS — F411 Generalized anxiety disorder: Secondary | ICD-10-CM

## 2018-06-30 MED ORDER — ALPRAZOLAM 0.25 MG PO TABS: 0.2500 mg | ORAL_TABLET | Freq: Two times a day (BID) | ORAL | 1 refills | Status: DC

## 2018-06-30 NOTE — Telephone Encounter (Signed)
Requested medication (s) are due for refill today:   Yes  Requested medication (s) are on the active medication list:   Yes  Future visit scheduled:   No  PCP:  Shanda Bumps [place]   Last ordered: 12/15/17  #180  1 refill   Requested Prescriptions  Pending Prescriptions Disp Refills   ALPRAZolam (XANAX) 0.25 MG tablet 180 tablet 1    Sig: Take 1 tablet (0.25 mg total) by mouth 2 (two) times daily.     Not Delegated - Psychiatry:  Anxiolytics/Hypnotics Failed - 06/30/2018 12:40 PM      Failed - This refill cannot be delegated      Passed - Urine Drug Screen completed in last 360 days.      Passed - Valid encounter within last 6 months    Recent Outpatient Visits          1 month ago Acute midline thoracic back pain   [facility] at [facility] [place], Gwenlyn Found, MD   6 months ago History of anemia   [facility] at [facility], Gwenlyn Found, MD   11 months ago Hypokalemia   [facility] at [facility], Gwenlyn Found, MD   1 year ago Memory loss   [facility] representative at [facility], Gwenlyn Found, MD   2 years ago Metabolic encephalopathy   [facility] at [facility] Sheliah Hatch, MD

## 2018-06-30 NOTE — Telephone Encounter (Signed)
Reviewed NCCSR:  03/29/2018  1  12/15/2017  Alprazolam 0.25 Mg Tablet  180.00 90 Je Cop  [id number]  [facility] ([id number])  1/1 1.00 LME Medicare  [place]  12/31/2017  1  12/15/2017  Alprazolam 0.25 Mg Tablet  180.00 90 Je Cop  [id number]  [facility] ([id number])  0/1 1.00 LME Medicare  [place]  08/17/2017  1  06/08/2017  Alprazolam 1 Mg Tablet  120.00 30 Am Georgianne Fick  [id number]  [facility] ([id number])  2/      Ok to refill for 6 months

## 2018-06-30 NOTE — Telephone Encounter (Signed)
Copied from CRM [id number]. Topic: Quick Communication - See Telephone Encounter >> Jun 30, 2018 12:28 PM Trula Slade wrote: CRM for notification. See Telephone encounter for: 06/30/18. Patient would like a refill on her ALPRAZolam (XANAX) 0.25 MG tablet medication and have it sent to their preferred pharmacy [facility], [address], [place].  Please use this pharmacy for any future prescriptions.

## 2018-11-09 NOTE — Progress Notes (Addendum)
[place] at [facility], [place], [place] [address] [phone]  Date:  11/10/2018   Name:  Tina Fitzpatrick   DOB:  29-Nov-1950   MRN:  [id number]  PCP:  Darreld Mclean, MD    Chief Complaint: Abdominal Pain (lower abdominal pain, 3-4 days, no known fever)   History of Present Illness:  Tina Fitzpatrick is a 68 y.o. very pleasant female patient who presents with the following:  Patient with history of hypertension, mild dementia I last saw her in September She had been in a memory care facility for a few months last year, but she was too high functioning and came back home She is mostly home alone during the day, as her husband is out working.  So far she has been safe at home by herself  Due for Pneumovax Can suggest Shingrix  Xanax Asa 81 Fenofibrate neurotonin Lisinopril Lopressor  BP Readings from Last 3 Encounters:  11/10/18 118/84  05/26/18 118/68  12/15/17 102/72   Her husband notes that she has complained of lower abd pain for 3-4 days-more exact details are difficult to come by, as he is not home during the day and her memory is not good. She vomited once as far as we know  No fever but she notes chills She notes some dysuria  She did eat breakfast today  Her husband has noted diarrhea No constipation  No back pain   They also note vaginal bleeding intermittent for about one year - unfortunately had not mentioned to me previously   Patient Active Problem List   Diagnosis Date Noted  . Normochromic normocytic anemia 09/09/2017  . Acute lower UTI 09/09/2017  . Acute encephalopathy 09/08/2017  . 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 Orthostatic hypotension 10/30/2015  . Mild dementia ([place]) 07/29/2015  . Encephalopathy, metabolic 67/67/2094  . Syncope 06/22/2013  . GERD (gastroesophageal reflux disease)   . Essential hypertension   . Hyperlipidemia    . Depression   . DDD (degenerative disc disease), cervical     Past Medical History:  Diagnosis Date  . DDD (degenerative disc disease), cervical   . Dementia ([place])   . Depression   . GERD (gastroesophageal reflux disease)   . Hyperlipidemia   . Hypertension   . Kidney stones     Past Surgical History:  Procedure Laterality Date  . kidney stones  1988  . NECK SURGERY  2008   Plate/Screws    Social History   Tobacco Use  . Smoking status: Former Smoker    Types: Cigarettes  . Smokeless tobacco: Never Used  Substance Use Topics  . Alcohol use: No    Alcohol/week: 0.0 standard drinks  . Drug use: No    Family History  Problem Relation Age of Onset  . Heart attack Mother     No Known Allergies  Medication list has been reviewed and updated.  Current Outpatient Medications on File Prior to Visit  Medication Sig Dispense Refill  . ALPRAZolam (XANAX) 0.25 MG tablet Take 1 tablet (0.25 mg total) by mouth 2 (two) times daily. 180 tablet 1  . aspirin 81 MG tablet Take 81 mg by mouth daily.    . fenofibrate 160 MG tablet Take 1 tablet (160 mg total) by mouth daily. 90 tablet 3  . gabapentin (NEURONTIN) 100 MG capsule Take 1 capsule (100 mg total) by mouth daily. 90 capsule 3  .  lisinopril (PRINIVIL,ZESTRIL) 5 MG tablet Take 1 tablet (5 mg total) by mouth daily. 90 tablet 3  . metoprolol tartrate (LOPRESSOR) 50 MG tablet Take 1 tablet (50 mg total) by mouth 2 (two) times daily. 180 tablet 3   No current facility-administered medications on file prior to visit.     Review of Systems:  As per HPI- otherwise negative. No constipation No rash  Physical Examination: Vitals:   11/10/18 1015  BP: 118/84  Pulse: 60  Resp: 16  Temp: 97.9 F (36.6 C)  SpO2: 98%   Vitals:   11/10/18 1015  Weight: 171 lb (77.6 kg)  Height: '5\' 7"'$  (1.702 m)   Body mass index is 26.78 kg/m. Ideal Body Weight: Weight in (lb) to have BMI = 25: 159.3  GEN: WDWN, NAD, Non-toxic, A &  O x 3, looks well HEENT: Atraumatic, Normocephalic. Neck supple. No masses, No LAD.  Bilateral TM wnl, oropharynx normal.  PEERL,EOMI.   Ears and Nose: No external deformity. CV: RRR, No M/G/R. No JVD. No thrill. No extra heart sounds. PULM: CTA B, no wheezes, crackles, rhonchi. No retractions. No resp. distress. No accessory muscle use. ABD: S, ND, +BS. No rebound. No HSM.  She notes lower abdominal tenderness today, across the entire lower abdomen EXTR: No c/c/e NEURO Normal gait.  PSYCH: Quiet and calm demeanor.  Tina Fitzpatrick mostly looks to her husband when I asked a question.  She became upset and tearful towards the end of our visit, but was able to be consoled. Pelvic: normal, no vaginal lesions or discharge. Uterus normal, no CMT, no adnexal tendereness or masses Collected pap.  There was a tiny amount of dark blood at the vaginal introitus.  No blood seen from cervix at this time   Assessment and Plan: Lower abdominal pain - Plan: Urine Culture, POCT urinalysis dipstick, CT Abdomen Pelvis W Contrast, CBC, Comp Met (CMET), CANCELED: Comprehensive metabolic panel  Post-menopausal bleeding - Plan: Cytology - PAP, Ambulatory referral to Obstetrics / Gynecology  Screening for cervical cancer - Plan: Cytology - PAP  Immunization due - Plan: Pneumococcal polysaccharide vaccine 23-valent greater than or equal to 2yo subcutaneous/IM  Here today with lower abdominal pain for about 3 days.  She has vomited once that we know of, and has had some diarrhea.  Other details are not available due to patient's situation. She does have abdominal tenderness, and her urine is benign. We will obtain a CT scan today for further evaluation Gave Pneumovax today Postmenopausal bleeding-explained that this is not normal.  I will refer her to gynecology for further evaluation  Signed Lamar Blinks, MD  Received her labs and CT results, approximately 6 PM  Results for orders placed or performed in visit on  11/10/18  CBC  Result Value Ref Range   WBC 5.6 4.0 - 10.5 K/uL   RBC 4.41 3.87 - 5.11 Mil/uL   Platelets 196.0 150.0 - 400.0 K/uL   Hemoglobin 14.0 12.0 - 15.0 g/dL   HCT 40.3 36.0 - 46.0 %   MCV 91.4 78.0 - 100.0 fl   MCHC 34.7 30.0 - 36.0 g/dL   RDW 13.0 11.5 - 15.5 %  Comp Met (CMET)  Result Value Ref Range   Sodium 144 135 - 145 mEq/L   Potassium 4.9 3.5 - 5.1 mEq/L   Chloride 108 96 - 112 mEq/L   CO2 27 19 - 32 mEq/L   Glucose, Bld 99 70 - 99 mg/dL   BUN 16 6 - 23 mg/dL  Creatinine, Ser 0.99 0.40 - 1.20 mg/dL   Total Bilirubin 0.6 0.2 - 1.2 mg/dL   Alkaline Phosphatase 48 39 - 117 U/L   AST 17 0 - 37 U/L   ALT 17 0 - 35 U/L   Total Protein 7.1 6.0 - 8.3 g/dL   Albumin 4.7 3.5 - 5.2 g/dL   Calcium 10.7 (H) 8.4 - 10.5 mg/dL   GFR 56.05 (L) >60.00 mL/min  POCT urinalysis dipstick  Result Value Ref Range   Color, UA yellow yellow   Clarity, UA clear clear   Glucose, UA negative negative mg/dL   Bilirubin, UA negative negative   Ketones, POC UA negative negative mg/dL   Spec Grav, UA 1.020 1.010 - 1.025   Blood, UA trace-intact (A) negative   pH, UA 6.5 5.0 - 8.0   Protein Ur, POC negative negative mg/dL   Urobilinogen, UA 0.2 0.2 or 1.0 E.U./dL   Nitrite, UA Negative Negative   Leukocytes, UA Negative Negative   Ct Abdomen Pelvis W Contrast  Result Date: 11/10/2018 CLINICAL DATA:  Lower abdominal pain. Nausea, vomiting and diarrhea for 4 days. EXAM: CT ABDOMEN AND PELVIS WITH CONTRAST TECHNIQUE: Multidetector CT imaging of the abdomen and pelvis was performed using the standard protocol following bolus administration of intravenous contrast. CONTRAST:  134m ISOVUE-300 IOPAMIDOL (ISOVUE-300) INJECTION 61% COMPARISON:  None. FINDINGS: Lower chest: No acute abnormality. Hepatobiliary: Liver is slightly low in density throughout suggesting fatty infiltration. No focal mass or lesion appreciated within the liver. Gallbladder appears normal. No bile duct dilatation.  Pancreas: Unremarkable. No pancreatic ductal dilatation or surrounding inflammatory changes. Spleen: Normal in size without focal abnormality. Adrenals/Urinary Tract: Adrenal glands appear normal. Kidneys appear normal without mass, stone or hydronephrosis. No ureteral or bladder calculi identified. Bladder appears normal, partially decompressed. Stomach/Bowel: No dilated large or small bowel loops. Stomach is unremarkable, partially decompressed. Thickening of the walls of a focal segment of small bowel within the central/LEFT abdomen, suggesting focal enteritis. No other site of bowel wall thickening or evidence of bowel wall inflammation identified. Appendix is not seen but there are no inflammatory changes about the cecum to suggest acute appendicitis. Vascular/Lymphatic: Aortic atherosclerosis. No acute appearing vascular abnormality. No enlarged lymph nodes seen. Reproductive: Uterus and bilateral adnexa are unremarkable. Other: No free fluid or abscess collection. No free intraperitoneal air. Musculoskeletal: No acute or suspicious osseous finding. Mild degenerative spurring within the thoracic and lumbar spine. IMPRESSION: 1. Thickening of the walls of a focal segment of small bowel within the central/LEFT abdomen, suggesting focal enteritis of infectious or inflammatory nature. No bowel obstruction. 2. Probable fatty infiltration of the liver. Aortic Atherosclerosis (ICD10-I70.0). Electronically Signed   By: SFranki CabotM.D.   On: 11/10/2018 16:45   Called and spoke with her husband. Her blood work is okay except for slight elevation of calcium level CT scan shows possible enteritis, but no bowel obstruction. I advised them to use a soft, low residue diet.  We will keep each other closely posted on her progress.  I am not certain if blood in urine was due to vaginal bleeding.  Await urine culture I did call and discuss her case with gastroenterologist on call.  He agreed with low residue diet, and  suggested stool pathogen profile if her symptoms continue.  3/3-have now received all of her labs, lab letter to pt  Called and spoke with her husband.  He notes that JMariadelis eating well, not vomiting, having normal stools, no fever.  However she continues to complain of lower abdominal pain.  This may be related to her vaginal bleeding. I do not see any update on her gynecology referral in the computer.  I will contact my referral staff to find out the status. In the meantime, counseled her husband that if she has worsening symptoms, is not able to eat, or is vomiting, that she should go to the emergency room He states agreement and understanding  3/5 called again to check on her.  Her husband reports she is eating well, passing stools, no vomiting.  She continues to complain of vague abdominal discomfort. It is difficult to gauge exactly what this means, given her memory issues.  However I think if her small bowel issue had progressed to an ileus or SBO, she would be experiencing vomiting or more severe pain. I have shared this thinking with her husband.  I have offered to bring her into the office tomorrow for recheck with 1 of my partners.  In the end we decided to bring her in to see me on Monday, I have made her an appointment.  Will also start her on augmentin I gave her husband the phone number for women's health at the med center.  He will call them tomorrow and work on getting appointment I have advised him to take Tina Fitzpatrick to the emergency room if she has any worsening over the weekend, such as vomiting, fever, inability to eat, or worsening pain    Results for orders placed or performed in visit on 11/10/18  Urine Culture  Result Value Ref Range   MICRO NUMBER: [id number]    SPECIMEN QUALITY: Adequate    Sample Source URINE    STATUS: FINAL    ISOLATE 1:      Multiple organisms present, each less than 10,000 CFU/mL. These organisms, commonly found on external and internal genitalia,  are considered to be colonizers. No further testing performed.  CBC  Result Value Ref Range   WBC 5.6 4.0 - 10.5 K/uL   RBC 4.41 3.87 - 5.11 Mil/uL   Platelets 196.0 150.0 - 400.0 K/uL   Hemoglobin 14.0 12.0 - 15.0 g/dL   HCT 40.3 36.0 - 46.0 %   MCV 91.4 78.0 - 100.0 fl   MCHC 34.7 30.0 - 36.0 g/dL   RDW 13.0 11.5 - 15.5 %  Comp Met (CMET)  Result Value Ref Range   Sodium 144 135 - 145 mEq/L   Potassium 4.9 3.5 - 5.1 mEq/L   Chloride 108 96 - 112 mEq/L   CO2 27 19 - 32 mEq/L   Glucose, Bld 99 70 - 99 mg/dL   BUN 16 6 - 23 mg/dL   Creatinine, Ser 0.99 0.40 - 1.20 mg/dL   Total Bilirubin 0.6 0.2 - 1.2 mg/dL   Alkaline Phosphatase 48 39 - 117 U/L   AST 17 0 - 37 U/L   ALT 17 0 - 35 U/L   Total Protein 7.1 6.0 - 8.3 g/dL   Albumin 4.7 3.5 - 5.2 g/dL   Calcium 10.7 (H) 8.4 - 10.5 mg/dL   GFR 56.05 (L) >60.00 mL/min  POCT urinalysis dipstick  Result Value Ref Range   Color, UA yellow yellow   Clarity, UA clear clear   Glucose, UA negative negative mg/dL   Bilirubin, UA negative negative   Ketones, POC UA negative negative mg/dL   Spec Grav, UA 1.020 1.010 - 1.025   Blood, UA trace-intact (A) negative   pH, UA 6.5 5.0 -  8.0   Protein Ur, POC negative negative mg/dL   Urobilinogen, UA 0.2 0.2 or 1.0 E.U./dL   Nitrite, UA Negative Negative   Leukocytes, UA Negative Negative  Cytology - PAP  Result Value Ref Range   Adequacy      Satisfactory for evaluation  endocervical/transformation zone component PRESENT.   Diagnosis      NEGATIVE FOR INTRAEPITHELIAL LESIONS OR MALIGNANCY.   HPV NOT DETECTED    Chlamydia Negative    Neisseria gonorrhea Negative    Material Submitted CervicoVaginal Pap [ThinPrep Imaged]

## 2018-11-10 ENCOUNTER — Encounter (HOSPITAL_BASED_OUTPATIENT_CLINIC_OR_DEPARTMENT_OTHER): Payer: Self-pay

## 2018-11-10 ENCOUNTER — Other Ambulatory Visit (HOSPITAL_COMMUNITY)
Admission: RE | Admit: 2018-11-10 | Discharge: 2018-11-10 | Disposition: A | Discharge status: Home / Self Care | Payer: Medicare Other | Source: Ambulatory Visit | Attending: Family Medicine | Admitting: Family Medicine

## 2018-11-10 ENCOUNTER — Encounter: Payer: Self-pay | Admitting: Family Medicine

## 2018-11-10 ENCOUNTER — Ambulatory Visit (INDEPENDENT_AMBULATORY_CARE_PROVIDER_SITE_OTHER): Payer: Medicare Other | Admitting: Family Medicine

## 2018-11-10 ENCOUNTER — Ambulatory Visit (HOSPITAL_BASED_OUTPATIENT_CLINIC_OR_DEPARTMENT_OTHER)
Admission: RE | Admit: 2018-11-10 | Discharge: 2018-11-10 | Disposition: A | Discharge status: Home / Self Care | Payer: Medicare Other | Source: Ambulatory Visit | Attending: Family Medicine | Admitting: Family Medicine

## 2018-11-10 VITALS — BP 118/84 | HR 60 | Temp 97.9°F | Resp 16 | Ht 67.0 in | Wt 171.0 lb

## 2018-11-10 DIAGNOSIS — N950 Postmenopausal bleeding: Secondary | ICD-10-CM | POA: Insufficient documentation

## 2018-11-10 DIAGNOSIS — Z23 Encounter for immunization: Secondary | ICD-10-CM | POA: Diagnosis not present

## 2018-11-10 DIAGNOSIS — Z124 Encounter for screening for malignant neoplasm of cervix: Secondary | ICD-10-CM | POA: Diagnosis not present

## 2018-11-10 DIAGNOSIS — R1110 Vomiting, unspecified: Secondary | ICD-10-CM | POA: Diagnosis not present

## 2018-11-10 DIAGNOSIS — Z1151 Encounter for screening for human papillomavirus (HPV): Secondary | ICD-10-CM | POA: Diagnosis not present

## 2018-11-10 DIAGNOSIS — R1030 Lower abdominal pain, unspecified: Secondary | ICD-10-CM

## 2018-11-10 DIAGNOSIS — R197 Diarrhea, unspecified: Secondary | ICD-10-CM | POA: Diagnosis not present

## 2018-11-10 LAB — COMPREHENSIVE METABOLIC PANEL
ALT: 17 U/L (ref 0–35)
AST: 17 U/L (ref 0–37)
Albumin: 4.7 g/dL (ref 3.5–5.2)
Alkaline Phosphatase: 48 U/L (ref 39–117)
BUN: 16 mg/dL (ref 6–23)
CO2: 27 meq/L (ref 19–32)
Calcium: 10.7 mg/dL — ABNORMAL HIGH (ref 8.4–10.5)
Chloride: 108 mEq/L (ref 96–112)
Creatinine, Ser: 0.99 mg/dL (ref 0.40–1.20)
GFR: 56.05 mL/min — ABNORMAL LOW (ref >60.00)
Glucose, Bld: 99 mg/dL (ref 70–99)
Potassium: 4.9 mEq/L (ref 3.5–5.1)
Sodium: 144 mEq/L (ref 135–145)
Total Bilirubin: 0.6 mg/dL (ref 0.2–1.2)
Total Protein: 7.1 g/dL (ref 6.0–8.3)

## 2018-11-10 LAB — POCT URINALYSIS DIP (MANUAL ENTRY)
Bilirubin, UA: NEGATIVE
GLUCOSE UA: NEGATIVE mg/dL
Ketones, POC UA: NEGATIVE mg/dL
Leukocytes, UA: NEGATIVE
NITRITE UA: NEGATIVE
PH UA: 6.5 (ref 5.0–8.0)
Protein Ur, POC: NEGATIVE mg/dL
SPEC GRAV UA: 1.02 (ref 1.010–1.025)
Urobilinogen, UA: 0.2 E.U./dL

## 2018-11-10 LAB — CBC
HCT: 40.3 % (ref 36.0–46.0)
Hemoglobin: 14 g/dL (ref 12.0–15.0)
MCHC: 34.7 g/dL (ref 30.0–36.0)
MCV: 91.4 fl (ref 78.0–100.0)
Platelets: 196 10*3/uL (ref 150.0–400.0)
RBC: 4.41 Mil/uL (ref 3.87–5.11)
RDW: 13 % (ref 11.5–15.5)
WBC: 5.6 10*3/uL (ref 4.0–10.5)

## 2018-11-10 MED ORDER — IOPAMIDOL (ISOVUE-300) INJECTION 61%
100.0000 mL | Freq: Once | INTRAVENOUS | Status: AC | PRN
  Administered 2018-11-10: 100 mL via INTRAVENOUS

## 2018-11-10 NOTE — Patient Instructions (Addendum)
Please go to lab to have your blood drawn. Then proceed to the imaging department on the ground floor.  If you will take the elevator to the ground floor, and turn left, you will see imaging services   They will set you up for a CT scan today.  You have to drink some contrast material first, they may have you take this with you and then come back in a couple of hours I will be in touch with your report We are also going to have you see GYN regarding your bleeding.    Please do be sure to complete the Cologuard kit that I sent you in the fall   We will give your pneumonia booster today

## 2018-11-11 LAB — URINE CULTURE
MICRO NUMBER: 250852
SPECIMEN QUALITY:: ADEQUATE

## 2018-11-14 LAB — CYTOLOGY - PAP
Chlamydia: NEGATIVE
Diagnosis: NEGATIVE
HPV: NOT DETECTED
Neisseria Gonorrhea: NEGATIVE

## 2018-11-15 NOTE — Addendum Note (Signed)
Addended by: Abbe Amsterdam C on: 11/15/2018 03:01 PM   Modules accepted: Orders

## 2018-11-17 MED ORDER — AMOXICILLIN-POT CLAVULANATE 875-125 MG PO TABS: 1.0000 | ORAL_TABLET | Freq: Two times a day (BID) | ORAL | 0 refills | Status: DC

## 2018-11-17 NOTE — Addendum Note (Signed)
Addended by: Pearline Cables on: 11/17/2018 06:38 PM   Modules accepted: Orders

## 2018-11-17 NOTE — Progress Notes (Addendum)
[facility] [address] [place], [place] [address] [phone]  Date:  11/21/2018   Name:  Tina Fitzpatrick   DOB:  1950/10/16   MRN:  [id number]  PCP:  Pearline Cables, MD    Chief Complaint: Abdominal Pain (follow up, still having pain)   History of Present Illness:  Tina Fitzpatrick is a 68 y.o. very pleasant female patient who presents with the following: Seen by myself on February 27 with vague abdominal pain.  We did obtain a CT scan, see below.  We also did complete labs which were normal except for slight hypercalcemia.  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 Nausea, vomiting and diarrhea for 4 days. EXAM: CT ABDOMEN AND PELVIS WITH CONTRAST TECHNIQUE: Multidetector CT imaging of the abdomen and pelvis was performed using the standard protocol following bolus administration of intravenous contrast. CONTRAST:  ISOVUE-300 IOPAMIDOL (ISOVUE-300) INJECTION 61% COMPARISON:  None. FINDINGS: Lower chest: No acute abnormality. Hepatobiliary: Liver is slightly low in density throughout suggesting fatty infiltration. No focal mass or lesion appreciated within the liver. Gallbladder appears normal. No bile duct dilatation. Pancreas: Unremarkable. No pancreatic ductal dilatation or surrounding inflammatory changes. Spleen: Normal in size without focal abnormality. Adrenals/Urinary Tract: Adrenal glands appear normal. Kidneys appear normal without mass, stone or hydronephrosis. No ureteral or bladder calculi identified. Bladder appears normal, partially decompressed. Stomach/Bowel: No dilated large or small bowel loops. Stomach is unremarkable, partially decompressed. Thickening of the walls of a focal segment of small bowel within  the central/LEFT abdomen, suggesting focal enteritis. No other site of bowel wall thickening or evidence of bowel wall inflammation identified. Appendix is not seen but there are no inflammatory changes about the cecum to suggest acute appendicitis. Vascular/Lymphatic: Aortic atherosclerosis. No acute appearing vascular abnormality. No enlarged lymph nodes seen. Reproductive: Uterus and bilateral adnexa are unremarkable. Other: No free fluid or abscess collection. No free intraperitoneal air. Musculoskeletal: No acute or suspicious osseous finding. Mild degenerative spurring within the thoracic and lumbar spine. IMPRESSION: 1. Thickening of the walls of a focal segment of small bowel within the central/LEFT abdomen, suggesting focal enteritis of infectious or inflammatory nature. No bowel obstruction. 2. Probable fatty infiltration of the liver. Aortic Atherosclerosis (ICD10-I70.0). Electronically Signed   By: Bary Richard M.D.   On: 11/10/2018 16:45   She has continued to have some complaint of pain, and I started her on Augmentin 4 days ago  She has a GYN appointment in 10 days to evaluate postmenopausal bleeding  No vomiting She is eating ok, no apparent change in appetite.  She is still passing stools They started on augmetin, cannot tell any definite difference No fever  Today Tina Fitzpatrick notes that she has pain in her back and indicates discomfort over her entire abdomen.  No other definite symptoms.  She is sometimes tearful and upset during exam, but is able to be consoled She did begin to cry when I mentioned the need for GYN evaluation to ensure she does not have uterine cancer  Patient Active Problem List   Diagnosis Date Noted  . Normochromic normocytic anemia 09/09/2017  . Acute lower UTI 09/09/2017  . Acute encephalopathy 09/08/2017  . 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 Orthostatic hypotension 10/30/2015  . Mild dementia (HCC) 07/29/2015  .  Encephalopathy, metabolic 06/24/2015  . Syncope 06/22/2013  . GERD (gastroesophageal reflux disease)   . Essential hypertension   . Hyperlipidemia   . Depression   . DDD (degenerative disc disease), cervical     Past Medical History:  Diagnosis Date  . DDD (degenerative disc disease), cervical   . Dementia (HCC)   . Depression   . GERD (gastroesophageal reflux disease)   . Hyperlipidemia   . Hypertension   . Kidney stones     Past Surgical History:  Procedure Laterality Date  . kidney stones  1988  . NECK SURGERY  2008   Plate/Screws    Social History   Tobacco Use  . Smoking status: Former Smoker    Types: Cigarettes  . Smokeless tobacco: Never Used  Substance Use Topics  . Alcohol use: No    Alcohol/week: 0.0 standard drinks  . Drug use: No    Family History  Problem Relation Age of Onset  . Heart attack Mother     No Known Allergies  Medication list has been reviewed and updated.  Current Outpatient Medications on File Prior to Visit  Medication Sig Dispense Refill  . ALPRAZolam (XANAX) 0.25 MG tablet Take 1 tablet (0.25 mg total) by mouth 2 (two) times daily. 180 tablet 1  . amoxicillin-clavulanate (AUGMENTIN) 875-125 MG tablet Take 1 tablet by mouth 2 (two) times daily. 20 tablet 0  . aspirin 81 MG tablet Take 81 mg by mouth daily.    . fenofibrate 160 MG tablet Take 1 tablet (160 mg total) by mouth daily. 90 tablet 3  . gabapentin (NEURONTIN) 100 MG capsule Take 1 capsule (100 mg total) by mouth daily. 90 capsule 3  . lisinopril (PRINIVIL,ZESTRIL) 5 MG tablet Take 1 tablet (5 mg total) by mouth daily. 90 tablet 3  . metoprolol tartrate (LOPRESSOR) 50 MG tablet Take 1 tablet (50 mg total) by mouth 2 (two) times daily. 180 tablet 3   No current facility-administered medications on file prior to visit.     Review of Systems:  As per HPI- otherwise negative.   Physical Examination: Vitals:   11/21/18 1539  BP: 124/80  Pulse: 63  Resp: 16   Temp: 97.7 F (36.5 C)  SpO2: 97%   Vitals:   11/21/18 1539  Weight: 170 lb (77.1 kg)  Height: 5\' 7"  (1.702 m)   Body mass index is 26.63 kg/m. Ideal Body Weight: Weight in (lb) to have BMI = 25: 159.3  GEN: WDWN, NAD, Non-toxic, A & O x 3, mild overweight, looks well HEENT: Atraumatic, Normocephalic. Neck supple. No masses, No LAD. Ears and Nose: No external deformity. CV: RRR, No M/G/R. No JVD. No thrill. No extra heart sounds. PULM: CTA B, no wheezes, crackles, rhonchi. No retractions. No resp. distress. No accessory muscle use. ABD: S,  ND, +BS. No rebound. No HSM.  Patient indicates tenderness over the entire abdomen, her belly is soft and there is no guarding, active bowel sounds EXTR: No c/c/e NEURO Normal gait.  PSYCH: Generally calm during exam, is not really able to answer questions.  As above, she was intermittently tearful   Assessment and Plan: Lower abdominal pain - Plan: Comprehensive metabolic panel, CBC  Epigastric pain - Plan: DG Abd 2 Views, pantoprazole (PROTONIX) 20 MG tablet, Ambulatory referral to Gastroenterology, CANCELED: GI pathogen panel by PCR, stool  Serum calcium elevated - Plan: PTH, intact (no Ca)  Here today with continued abdominal pain for 10 days. The cause is not clear She does have postmenopausal bleeding, and a GYN appointment is pending We will repeat her labs today, check PTH due to elevated calcium I do not think she has a small bowel obstruction, but will obtain upright abdomen to be sure no air-fluid levels Placed referral to gastroenterology Add Protonix Offered to have patient evaluated in the ER today.  However they have brought their dog who is waiting in the car.  I advised that if Tina Fitzpatrick is not doing well, please bring her back to the emergency room after they have dropped their dog off at home  Await labs and upright abdomen  Signed Abbe Amsterdam, MD  Addendum 3/10, received her labs and abdominal film  CLINICAL  DATA:  Epigastric pain for 2-3 days. EXAM: ABDOMEN - 2 VIEW COMPARISON:  CT abdomen and pelvis 11/10/2018. FINDINGS: The bowel gas pattern is normal. There is no evidence of free air. No radio-opaque calculi or other significant radiographic abnormality is seen.  IMPRESSION: Negative exam.   Results for orders placed or performed in visit on 11/21/18  Comprehensive metabolic panel  Result Value Ref Range   Sodium 144 135 - 145 mEq/L   Potassium 4.9 3.5 - 5.1 mEq/L   Chloride 108 96 - 112 mEq/L   CO2 26 19 - 32 mEq/L   Glucose, Bld 92 70 - 99 mg/dL   BUN 14 6 - 23 mg/dL   Creatinine, Ser 1.61 0.40 - 1.20 mg/dL   Total Bilirubin 0.6 0.2 - 1.2 mg/dL   Alkaline Phosphatase 48 39 - 117 U/L   AST 20 0 - 37 U/L   ALT 17 0 - 35 U/L   Total Protein 7.2 6.0 - 8.3 g/dL   Albumin 4.8 3.5 - 5.2 g/dL   Calcium 09.6 (H) 8.4 - 10.5 mg/dL   GFR 04.54 (L) >09.81 mL/min  CBC  Result Value Ref Range   WBC 6.9 4.0 - 10.5 K/uL   RBC 4.38 3.87 - 5.11 Mil/uL   Platelets 218.0 150.0 - 400.0 K/uL   Hemoglobin 13.6 12.0 - 15.0 g/dL   HCT 19.1 47.8 - 29.5 %   MCV 91.7 78.0 - 100.0 fl   MCHC 33.9 30.0 - 36.0 g/dL   RDW 62.1 30.8 - 65.7 %  PTH, intact (no Ca)  Result Value Ref Range   PTH 15 14 - 64 pg/mL   Her calcium continues to be elevated, but PTH is normal Otherwise metabolic profile is normal Elevated calcium is new as of last month No medications that should be causing hypercalcemia Abdominal films do not suggest SBO

## 2018-11-21 ENCOUNTER — Encounter: Payer: Self-pay | Admitting: Family Medicine

## 2018-11-21 ENCOUNTER — Ambulatory Visit (INDEPENDENT_AMBULATORY_CARE_PROVIDER_SITE_OTHER): Payer: Medicare Other | Admitting: Family Medicine

## 2018-11-21 ENCOUNTER — Ambulatory Visit (HOSPITAL_BASED_OUTPATIENT_CLINIC_OR_DEPARTMENT_OTHER)
Admission: RE | Admit: 2018-11-21 | Discharge: 2018-11-21 | Disposition: A | Discharge status: Home / Self Care | Payer: Medicare Other | Source: Ambulatory Visit | Attending: Family Medicine | Admitting: Family Medicine

## 2018-11-21 VITALS — BP 124/80 | HR 63 | Temp 97.7°F | Resp 16 | Ht 67.0 in | Wt 170.0 lb

## 2018-11-21 DIAGNOSIS — R1013 Epigastric pain: Secondary | ICD-10-CM

## 2018-11-21 DIAGNOSIS — R1030 Lower abdominal pain, unspecified: Secondary | ICD-10-CM

## 2018-11-21 MED ORDER — PANTOPRAZOLE SODIUM 20 MG PO TBEC: 20.0000 mg | DELAYED_RELEASE_TABLET | Freq: Every day | ORAL | 6 refills | Status: DC

## 2018-11-21 NOTE — Patient Instructions (Addendum)
It was good to see you again today I am going to set you up to see gastroenterology as well- they have an office here at the med center Today we will repeat your labs and do an x-ray of your belly to look for any sign of a small bowel obstruction  The lab will also give you a kit to collect a stool sample at home to check for any infection/ abnormal bacteria

## 2018-11-22 ENCOUNTER — Encounter: Payer: Self-pay | Admitting: Gastroenterology

## 2018-11-22 LAB — COMPREHENSIVE METABOLIC PANEL
ALT: 17 U/L (ref 0–35)
AST: 20 U/L (ref 0–37)
Albumin: 4.8 g/dL (ref 3.5–5.2)
Alkaline Phosphatase: 48 U/L (ref 39–117)
BUN: 14 mg/dL (ref 6–23)
CO2: 26 mEq/L (ref 19–32)
Calcium: 10.8 mg/dL — ABNORMAL HIGH (ref 8.4–10.5)
Chloride: 108 mEq/L (ref 96–112)
Creatinine, Ser: 0.95 mg/dL (ref 0.40–1.20)
GFR: 58.78 mL/min — AB (ref >60.00)
Glucose, Bld: 92 mg/dL (ref 70–99)
Potassium: 4.9 mEq/L (ref 3.5–5.1)
Sodium: 144 mEq/L (ref 135–145)
Total Bilirubin: 0.6 mg/dL (ref 0.2–1.2)
Total Protein: 7.2 g/dL (ref 6.0–8.3)

## 2018-11-22 LAB — CBC
HCT: 40.2 % (ref 36.0–46.0)
Hemoglobin: 13.6 g/dL (ref 12.0–15.0)
MCHC: 33.9 g/dL (ref 30.0–36.0)
MCV: 91.7 fl (ref 78.0–100.0)
PLATELETS: 218 10*3/uL (ref 150.0–400.0)
RBC: 4.38 Mil/uL (ref 3.87–5.11)
RDW: 12.6 % (ref 11.5–15.5)
WBC: 6.9 10*3/uL (ref 4.0–10.5)

## 2018-11-22 LAB — PARATHYROID HORMONE, INTACT (NO CA): PTH: 15 pg/mL (ref 14–64)

## 2018-11-22 NOTE — Addendum Note (Signed)
Addended by: Mervin Kung A on: 11/22/2018 08:10 AM   Modules accepted: Orders

## 2018-12-01 ENCOUNTER — Ambulatory Visit (INDEPENDENT_AMBULATORY_CARE_PROVIDER_SITE_OTHER): Payer: Medicare Other | Admitting: Family Medicine

## 2018-12-01 ENCOUNTER — Encounter: Payer: Self-pay | Admitting: Family Medicine

## 2018-12-01 ENCOUNTER — Other Ambulatory Visit: Payer: Self-pay

## 2018-12-01 VITALS — BP 133/72 | HR 82 | Ht 67.0 in | Wt 169.0 lb

## 2018-12-01 DIAGNOSIS — N950 Postmenopausal bleeding: Secondary | ICD-10-CM | POA: Diagnosis not present

## 2018-12-01 NOTE — Progress Notes (Signed)
Pt states that bleeding stopped over a week.Pt has been having abdominal pain x 1 month.

## 2018-12-01 NOTE — Progress Notes (Signed)
   Subjective:    Patient ID: Tina Fitzpatrick, female    DOB: 04-07-1951, 68 y.o.   MRN: [id number]  HPI Patient to our office for menopausal bleeding.  Patient accompanied by her spouse, who provides much of the history, due to dementia.  By report, the patient went through menopause approximately 3 to 4 years ago, but has had intermittent postmenopausal bleeding over the past year and a half.  She was hospitalized in October 2018 due to anemia, needing blood transfusions.  Husband said that she had vaginal bleeding at that time, but this was not told to the hospital staff, nor was this noticed on exam.  Bleeding is intermittent and last for a few days, mainly when she wipes.  Mainly, the patient complains of her upper abdominal pain.  This is been going on for few weeks.  She has an appointment with GI doctor later this week.  Review of Systems     Objective:   Physical Exam Vitals signs reviewed. Exam conducted with a chaperone present.  Cardiovascular:     Rate and Rhythm: Normal rate and regular rhythm.     Heart sounds: Normal heart sounds.  Pulmonary:     Effort: Pulmonary effort is normal. No respiratory distress.     Breath sounds: Normal breath sounds. No stridor. No wheezing.  Abdominal:     General: There is distension.     Palpations: Abdomen is soft.     Tenderness: There is abdominal tenderness in the right upper quadrant and right lower quadrant.     Hernia: There is no hernia in the right inguinal area or left inguinal area.  Genitourinary:    Exam position: Supine.     Pubic Area: No rash.      Labia:        Right: No rash, tenderness, lesion or injury.        Left: No rash, tenderness, lesion or injury.      Urethra: No prolapse, urethral pain, urethral swelling or urethral lesion.     Vagina: No signs of injury and foreign body. No vaginal discharge, erythema, tenderness, bleeding, lesions or prolapsed vaginal walls.     Cervix: No cervical motion tenderness,  friability, erythema, cervical bleeding or eversion.  Lymphadenopathy:     Lower Body: No right inguinal adenopathy. No left inguinal adenopathy.  Neurological:     Mental Status: She is alert.       Assessment & Plan:  1. Postmenopausal bleeding Discussed concern for endometrial cancer in anyone with postmenopausal bleeding.  Attempted to do endometrial biopsy after consenting patient and her husband. Speculum inserted, cervix cleaned with betadine and attempted to place tenaculum. Patient immediately started crying, pushed herself up the bed and told me to stop. Which I did. Will get [place] and have patient follow up her afterwards. May need biopsy with sedation.  - US PELVIS TRANSVANGINAL NON-OB (TV ONLY); Future

## 2018-12-03 ENCOUNTER — Emergency Department (HOSPITAL_BASED_OUTPATIENT_CLINIC_OR_DEPARTMENT_OTHER): Payer: Medicare Other

## 2018-12-03 ENCOUNTER — Other Ambulatory Visit: Payer: Self-pay

## 2018-12-03 ENCOUNTER — Emergency Department (HOSPITAL_BASED_OUTPATIENT_CLINIC_OR_DEPARTMENT_OTHER)
Admission: EM | Admit: 2018-12-03 | Discharge: 2018-12-03 | Disposition: A | Discharge status: Home / Self Care | Payer: Medicare Other | Attending: Emergency Medicine | Admitting: Emergency Medicine

## 2018-12-03 ENCOUNTER — Encounter (HOSPITAL_BASED_OUTPATIENT_CLINIC_OR_DEPARTMENT_OTHER): Payer: Self-pay | Admitting: Emergency Medicine

## 2018-12-03 DIAGNOSIS — Z79899 Other long term (current) drug therapy: Secondary | ICD-10-CM | POA: Insufficient documentation

## 2018-12-03 DIAGNOSIS — Z87891 Personal history of nicotine dependence: Secondary | ICD-10-CM | POA: Diagnosis not present

## 2018-12-03 DIAGNOSIS — K8020 Calculus of gallbladder without cholecystitis without obstruction: Secondary | ICD-10-CM | POA: Diagnosis not present

## 2018-12-03 DIAGNOSIS — F0390 Unspecified dementia without behavioral disturbance: Secondary | ICD-10-CM | POA: Insufficient documentation

## 2018-12-03 DIAGNOSIS — Z7982 Long term (current) use of aspirin: Secondary | ICD-10-CM | POA: Insufficient documentation

## 2018-12-03 DIAGNOSIS — I10 Essential (primary) hypertension: Secondary | ICD-10-CM | POA: Insufficient documentation

## 2018-12-03 DIAGNOSIS — R1012 Left upper quadrant pain: Secondary | ICD-10-CM | POA: Diagnosis not present

## 2018-12-03 LAB — CBC WITH DIFFERENTIAL/PLATELET
Abs Immature Granulocytes: 0.01 10*3/uL (ref 0.00–0.07)
BASOS PCT: 1 %
Basophils Absolute: 0.1 10*3/uL (ref 0.0–0.1)
Eosinophils Absolute: 0.1 10*3/uL (ref 0.0–0.5)
Eosinophils Relative: 2 %
HCT: 38.4 % (ref 36.0–46.0)
Hemoglobin: 12.8 g/dL (ref 12.0–15.0)
Immature Granulocytes: 0 %
Lymphocytes Relative: 34 %
Lymphs Abs: 1.7 10*3/uL (ref 0.7–4.0)
MCH: 30.7 pg (ref 26.0–34.0)
MCHC: 33.3 g/dL (ref 30.0–36.0)
MCV: 92.1 fL (ref 80.0–100.0)
Monocytes Absolute: 0.4 10*3/uL (ref 0.1–1.0)
Monocytes Relative: 7 %
Neutro Abs: 2.7 10*3/uL (ref 1.7–7.7)
Neutrophils Relative %: 56 %
Platelets: 176 10*3/uL (ref 150–400)
RBC: 4.17 MIL/uL (ref 3.87–5.11)
RDW: 12.5 % (ref 11.5–15.5)
WBC: 5 10*3/uL (ref 4.0–10.5)
nRBC: 0 % (ref 0.0–0.2)

## 2018-12-03 LAB — URINALYSIS, ROUTINE W REFLEX MICROSCOPIC
Bilirubin Urine: NEGATIVE
Glucose, UA: NEGATIVE mg/dL
Hgb urine dipstick: NEGATIVE
KETONES UR: NEGATIVE mg/dL
Leukocytes,Ua: NEGATIVE
Nitrite: NEGATIVE
Protein, ur: NEGATIVE mg/dL
Specific Gravity, Urine: 1.015 (ref 1.005–1.030)
pH: 8 (ref 5.0–8.0)

## 2018-12-03 LAB — COMPREHENSIVE METABOLIC PANEL
ALT: 23 U/L (ref 0–44)
ANION GAP: 10 (ref 5–15)
AST: 30 U/L (ref 15–41)
Albumin: 4.4 g/dL (ref 3.5–5.0)
Alkaline Phosphatase: 50 U/L (ref 38–126)
BUN: 12 mg/dL (ref 8–23)
CO2: 18 mmol/L — ABNORMAL LOW (ref 22–32)
Calcium: 10.2 mg/dL (ref 8.9–10.3)
Chloride: 113 mmol/L — ABNORMAL HIGH (ref 98–111)
Creatinine, Ser: 0.93 mg/dL (ref 0.44–1.00)
GFR calc Af Amer: >60 mL/min (ref >60)
Glucose, Bld: 123 mg/dL — ABNORMAL HIGH (ref 70–99)
Potassium: 3.4 mmol/L — ABNORMAL LOW (ref 3.5–5.1)
Sodium: 141 mmol/L (ref 135–145)
Total Bilirubin: 0.7 mg/dL (ref 0.3–1.2)
Total Protein: 7.1 g/dL (ref 6.5–8.1)

## 2018-12-03 LAB — LIPASE, BLOOD: Lipase: 44 U/L (ref 11–51)

## 2018-12-03 LAB — TROPONIN I: Troponin I: <0.03 ng/mL (ref <0.03)

## 2018-12-03 MED ORDER — IOHEXOL 300 MG/ML  SOLN
100.0000 mL | Freq: Once | INTRAMUSCULAR | Status: AC | PRN
  Administered 2018-12-03: 100 mL via INTRAVENOUS

## 2018-12-03 MED ORDER — ONDANSETRON HCL 4 MG/2ML IJ SOLN
4.0000 mg | Freq: Once | INTRAMUSCULAR | Status: AC
  Administered 2018-12-03: 4 mg via INTRAVENOUS
  Filled 2018-12-03: qty 2

## 2018-12-03 MED ORDER — LORAZEPAM 2 MG/ML IJ SOLN
0.5000 mg | Freq: Once | INTRAMUSCULAR | Status: AC
  Administered 2018-12-03: 0.5 mg via INTRAVENOUS
  Filled 2018-12-03: qty 1

## 2018-12-03 MED ORDER — LIDOCAINE 5 % EX PTCH: 1.0000 | MEDICATED_PATCH | CUTANEOUS | 0 refills | Status: DC

## 2018-12-03 MED ORDER — HYDROMORPHONE HCL 1 MG/ML IJ SOLN
0.5000 mg | INTRAMUSCULAR | Status: DC | PRN
  Administered 2018-12-03 (×2): 0.5 mg via INTRAVENOUS
  Filled 2018-12-03 (×2): qty 1

## 2018-12-03 MED ORDER — SODIUM CHLORIDE 0.9 % IV SOLN
INTRAVENOUS | Status: DC
  Administered 2018-12-03: 16:00:00 via INTRAVENOUS

## 2018-12-03 MED ORDER — SODIUM CHLORIDE 0.9 % IV BOLUS
1000.0000 mL | Freq: Once | INTRAVENOUS | Status: AC
  Administered 2018-12-03: 1000 mL via INTRAVENOUS

## 2018-12-03 NOTE — ED Notes (Signed)
Patient verbalizes understanding of discharge instructions. Opportunity for questioning and answers were provided. Armband removed by staff, pt discharged from ED in wheelchair with family.  

## 2018-12-03 NOTE — ED Triage Notes (Signed)
LUQ pain since yesterday. Denies N/VD

## 2018-12-03 NOTE — ED Provider Notes (Signed)
Patient care assumed at 1600. Patient with history of dementia presents to the emergency department accompanied by her husband for evaluation of progressive left upper quadrant pain. She is non-toxic appearing on evaluation but slightly anxious with no acute distress. She has no significant tenderness on palpation. No evidence of overlying rash or shingles. Presentation is not consistent with PE, ACS, dissection. CT abdomen and pelvis reviewed, which is negative for acute disease process.  UA not c/w UTI.   Discussed incidental finding of cholelithiasis, which does not appear to be contributing to her symptoms at this time. Counseled patient and husband on home care for left upper quadrant pain of unclear etiology. Discussed outpatient follow-up and return precautions.   Tilden Fossa, MD 12/03/18 [phone]

## 2018-12-03 NOTE — ED Provider Notes (Signed)
[facility] EMERGENCY DEPARTMENT Provider Note   CSN: [id number] Arrival date & time: 12/03/18  1315    History   Chief Complaint Chief Complaint  Patient presents with  . Abdominal Pain    HPI Tina Fitzpatrick is a 68 y.o. female.   HPI Patient presents to the emergency room for evaluation of abdominal pain.  The history is somewhat difficult because the patient has dementia but her husband is here and able to assist with the details.  Patient has been having abdominal pain since February.  She has been seeing her primary care doctor.  She has had a CT scan which showed questionable inflammation of the bowel but no other findings.  Patient has been having post menopausal vaginal bleeding.  She was referred to OB/GYN.  She was seen in the office there on March 19 where they were going to do an endometrial biopsy but the patient did not tolerate the procedure.  Plan is for her to have an outpatient ultrasound.  Today she started having more pain.  Patient states the pain is in her left upper abdomen.  It is severe .  she denies any chest pain or shortness of breath.  No nausea or vomiting.  No fevers or chills. Past Medical History:  Diagnosis Date  . DDD (degenerative disc disease), cervical   . Dementia (HCC)   . Depression   . GERD (gastroesophageal reflux disease)   . Hyperlipidemia   . Hypertension   . Kidney stones     Patient Active Problem List   Diagnosis Date Noted  . Normochromic normocytic anemia 09/09/2017  . Acute lower UTI 09/09/2017  . Acute encephalopathy 09/08/2017  . 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 Orthostatic hypotension 10/30/2015  . Mild dementia (HCC) 07/29/2015  . Encephalopathy, metabolic 06/24/2015  . Syncope 06/22/2013  . GERD (gastroesophageal reflux disease)   . Essential hypertension   . Hyperlipidemia   . Depression   . DDD (degenerative disc disease), cervical     Past Surgical History:   Procedure Laterality Date  . kidney stones  1988  . NECK SURGERY  2008   Plate/Screws     OB History    Gravida  2   Para  2   Term  2   Preterm      AB      Living  2     SAB      TAB      Ectopic      Multiple      Live Births  2            Home Medications    Prior to Admission medications   Medication Sig Start Date End Date Taking? Authorizing Provider  ALPRAZolam (XANAX) 0.25 MG tablet Take 1 tablet (0.25 mg total) by mouth 2 (two) times daily. 06/30/18   Copland, Gwenlyn FoundJessica C, MD  amoxicillin-clavulanate (AUGMENTIN) 875-125 MG tablet Take 1 tablet by mouth 2 (two) times daily. 11/17/18   Copland, Gwenlyn FoundJessica C, MD  aspirin 81 MG tablet Take 81 mg by mouth daily.    [provider]  fenofibrate 160 MG tablet Take 1 tablet (160 mg total) by mouth daily. 12/15/17   Copland, Gwenlyn FoundJessica C, MD  gabapentin (NEURONTIN) 100 MG capsule Take 1 capsule (100 mg total) by mouth daily. Patient not taking: Reported on 12/01/2018 12/15/17   Copland, Gwenlyn FoundJessica C, MD  lisinopril (PRINIVIL,ZESTRIL) 5 MG tablet Take 1 tablet (5 mg  total) by mouth daily. 12/15/17   Copland, Gwenlyn Found, MD  metoprolol tartrate (LOPRESSOR) 50 MG tablet Take 1 tablet (50 mg total) by mouth 2 (two) times daily. 12/15/17   Copland, Gwenlyn Found, MD  pantoprazole (PROTONIX) 20 MG tablet Take 1 tablet (20 mg total) by mouth daily. Patient not taking: Reported on 12/01/2018 11/21/18   Copland, Gwenlyn Found, MD    Family History Family History  Problem Relation Age of Onset  . Heart attack Mother     Social History Social History   Tobacco Use  . Smoking status: Former Smoker    Types: Cigarettes  . Smokeless tobacco: Never Used  Substance Use Topics  . Alcohol use: No    Alcohol/week: 0.0 standard drinks  . Drug use: No     Allergies   Patient has no known allergies.   Review of Systems Review of Systems  All other systems reviewed and are negative.    Physical Exam Updated Vital Signs BP (!)  160/86 (BP Location: Right Leg)   Pulse (!) 109   Temp 98.8 F (37.1 C) (Oral)   Resp 18   Wt 74.8 kg   SpO2 100%   BMI 25.84 kg/m   Physical Exam Vitals signs and nursing note reviewed.  Constitutional:      Appearance: She is well-developed. She is ill-appearing.  HENT:     Head: Normocephalic and atraumatic.     Right Ear: External ear normal.     Left Ear: External ear normal.  Eyes:     General: No scleral icterus.       Right eye: No discharge.        Left eye: No discharge.     Conjunctiva/sclera: Conjunctivae normal.  Neck:     Musculoskeletal: Neck supple.     Trachea: No tracheal deviation.  Cardiovascular:     Rate and Rhythm: Normal rate and regular rhythm.  Pulmonary:     Effort: Pulmonary effort is normal. No respiratory distress.     Breath sounds: Normal breath sounds. No stridor. No wheezing or rales.  Abdominal:     General: Bowel sounds are normal. There is no distension.     Palpations: Abdomen is soft.     Tenderness: There is abdominal tenderness in the left upper quadrant. There is no guarding or rebound.  Musculoskeletal:        General: No tenderness.  Skin:    General: Skin is warm and dry.     Findings: No rash.  Neurological:     Mental Status: She is alert.     Cranial Nerves: No cranial nerve deficit (no facial droop, extraocular movements intact, no slurred speech).     Sensory: No sensory deficit.     Motor: No abnormal muscle tone or seizure activity.     Coordination: Coordination normal.      ED Treatments / Results  Labs (all labs ordered are listed, but only abnormal results are displayed) Labs Reviewed  COMPREHENSIVE METABOLIC PANEL - Abnormal; Notable for the following components:      Result Value   Potassium 3.4 (*)    Chloride 113 (*)    CO2 18 (*)    Glucose, Bld 123 (*)    All other components within normal limits  LIPASE, BLOOD  CBC WITH DIFFERENTIAL/PLATELET  URINALYSIS, ROUTINE W REFLEX MICROSCOPIC   TROPONIN I    EKG EKG Interpretation  Date/Time:  Saturday December 03 2018 13:53:21 EDT Ventricular Rate:  91 PR  Interval:    QRS Duration: 106 QT Interval:  382 QTC Calculation: 470 R Axis:   -31 Text Interpretation:  Sinus rhythm Left axis deviation Low voltage, extremity leads Abnormal R-wave progression, late transition Abnormal inferior Q waves No significant change since last tracing Confirmed by Linwood Dibbles [phone]) on 12/03/2018 1:59:19 PM   Radiology No results found.  Procedures Procedures (including critical care time)  Medications Ordered in ED Medications  sodium chloride 0.9 % bolus 1,000 mL (1,000 mLs Intravenous New Bag/Given 12/03/18 1417)    And  0.9 %  sodium chloride infusion (has no administration in time range)  HYDROmorphone (DILAUDID) injection 0.5 mg (0.5 mg Intravenous Given 12/03/18 1524)  ondansetron (ZOFRAN) injection 4 mg (4 mg Intravenous Given 12/03/18 1413)     Initial Impression / Assessment and Plan / ED Course  I have reviewed the triage vital signs and the nursing notes.  Pertinent labs & imaging results that were available during my care of the patient were reviewed by me and considered in my medical decision making (see chart for details).  Clinical Course as of Dec 03 1535  Sat Dec 03, 2018  1530 Patient continues to have severe pain.  She is tearful.  We will proceed with CT scan   [JK]    Clinical Course User Index [JK] Linwood Dibbles, MD     Presented to the emergency room with recurrent abdominal pain.  Patient is getting a GYN work-up and was unable to tolerate endometrial biopsy recently however husband states she is not having any vaginal bleeding at this time.  Patient is also pointing towards her upper abdomen.  Previous CT scan question some inflammation in the bowel.  Considering her persistent severe pain I think is reasonable do a CT scan to evaluate further for the possibility obstruction, colitis, diverticulitis or other acute  abdominal pathology.  Case turned over to Dr Madilyn Hook  Final Clinical Impressions(s) / ED Diagnoses  pending   Linwood Dibbles, MD 12/03/18 1538

## 2018-12-03 NOTE — ED Notes (Signed)
Pt to CT via Rad Tech 

## 2018-12-03 NOTE — Discharge Instructions (Addendum)
The cause of Tina Fitzpatrick symptoms was not identified today.  She can take tylenol available over the counter according to label instructions as needed for pain as well as the lidoderm patch.  Get her rechecked if she develops any new or concerning symptoms.

## 2018-12-06 ENCOUNTER — Ambulatory Visit: Payer: Self-pay | Admitting: *Deleted

## 2018-12-13 ENCOUNTER — Other Ambulatory Visit: Payer: Self-pay | Admitting: Family Medicine

## 2018-12-13 DIAGNOSIS — I10 Essential (primary) hypertension: Secondary | ICD-10-CM

## 2018-12-14 ENCOUNTER — Other Ambulatory Visit: Payer: Self-pay

## 2018-12-14 ENCOUNTER — Encounter: Payer: Self-pay | Admitting: Gastroenterology

## 2018-12-14 ENCOUNTER — Telehealth (INDEPENDENT_AMBULATORY_CARE_PROVIDER_SITE_OTHER): Payer: Medicare Other | Admitting: Gastroenterology

## 2018-12-14 ENCOUNTER — Telehealth: Payer: Self-pay | Admitting: Gastroenterology

## 2018-12-14 DIAGNOSIS — Z1212 Encounter for screening for malignant neoplasm of rectum: Secondary | ICD-10-CM

## 2018-12-14 DIAGNOSIS — R1010 Upper abdominal pain, unspecified: Secondary | ICD-10-CM | POA: Diagnosis not present

## 2018-12-14 DIAGNOSIS — Z1211 Encounter for screening for malignant neoplasm of colon: Secondary | ICD-10-CM

## 2018-12-14 MED ORDER — SUCRALFATE 1 GM/10ML PO SUSP: 1.0000 g | Freq: Four times a day (QID) | ORAL | 1 refills | Status: DC | PRN

## 2018-12-14 MED ORDER — PANTOPRAZOLE SODIUM 40 MG PO TBEC: 40.0000 mg | DELAYED_RELEASE_TABLET | Freq: Every day | ORAL | 3 refills | Status: DC

## 2018-12-14 MED ORDER — PANTOPRAZOLE SODIUM 40 MG PO TBEC: 40.0000 mg | DELAYED_RELEASE_TABLET | Freq: Two times a day (BID) | ORAL | 0 refills | Status: DC

## 2018-12-14 MED ORDER — HYOSCYAMINE SULFATE 0.125 MG SL SUBL: 0.1250 mg | SUBLINGUAL_TABLET | Freq: Four times a day (QID) | SUBLINGUAL | 0 refills | Status: DC | PRN

## 2018-12-14 NOTE — Progress Notes (Signed)
Chief Complaint: Upper Abdominal pain  Referring Provider:     Tilden Fossa ([facility])   HPI:    Due to current restrictions/limitations of in-office visits due to the COVID-19 pandemic, this scheduled clinical appointment was converted to a telehealth virtual consultation using WebEx/Zoom.  -Time of medical discussion: 19 minutes -The patient did consent to this virtual visit and is aware of possible charges through their insurance for this visit.  -Names of all parties present: Tina Fitzpatrick (patient), Tina Fitzpatrick (husband), Doristine Locks, DO, [facility] (physician) -Patient location: Home -Physician location: Office   ABBYGAILE Fitzpatrick is a 68 y.o. female with a hx of dementia referred to the [facility] for evaluation of upper abdominal pain since February.   She was initially seen by her PCM on 2/27 with vague abdominal pain.  CT at that time with focal thickening of the small bowel in the central/left abdomen, suggesting focal enteritis without obstruction, probable fatty infiltration, otherwise normal.  Labs otherwise unrevealing. Started Augmentin for possible enteritis. Seen in f/u on 3/9 with ongoing pain. Abdominal x-ray negative.  Started on Protonix 20 mg daily and referred to GI.  She was then evaluated in the [facility] on 3/21 with c/o LUQ pain. Evaluation was largely unrevealing, to include repeat CT abd/pelvis (single dependent GB stone w/o cholecystitis or CDL) and the previously noted bowel wall thickening had since resolved. Normal UA, LAEs, lipase, CBC, and negative troponin and d/c-ed to home.  Recently referred to Gyn for post menopausal bleeding. Seen on 12/01/18 and plan for endometrial bx, which patient did not tolerate. TVUS ordered/pending.   Her husband assists in providing hx given dementia. She endorses upper abdominal pain, intermittently since Feb, radiating to the back. Across entire upper abdomen. Lasts throughout the  day. No exacerbating or alleviating factors. No improvement with trial of Protonix 20 mg. No prior similar sxs.  No associated n/v/d/c, hematochezia, melena, fever, or chills. No weight loss, night sweats.  No previous upper endoscopy or colonoscopy.  No known family history of GI malignancy, IBD, hepatobiliary disease.  Past medical history, past surgical history, social history, family history, medications, and allergies reviewed in the chart and with patient over the Zoom.  Past Medical History:  Diagnosis Date  . DDD (degenerative disc disease), cervical   . Dementia (HCC)   . Depression   . GERD (gastroesophageal reflux disease)   . Hyperlipidemia   . Hypertension   . Kidney stones      Past Surgical History:  Procedure Laterality Date  . kidney stones  1988  . NECK SURGERY  2008   Plate/Screws   Family History  Problem Relation Age of Onset  . Heart attack Mother    Social History   Tobacco Use  . Smoking status: Former Smoker    Types: Cigarettes  . Smokeless tobacco: Never Used  Substance Use Topics  . Alcohol use: No    Alcohol/week: 0.0 standard drinks  . Drug use: No   Current Outpatient Medications  Medication Sig Dispense Refill  . ALPRAZolam (XANAX) 0.25 MG tablet Take 1 tablet (0.25 mg total) by mouth 2 (two) times daily. 180 tablet 1  . amoxicillin-clavulanate (AUGMENTIN) 875-125 MG tablet Take 1 tablet by mouth 2 (two) times daily. 20 tablet 0  . aspirin 81 MG tablet Take 81 mg by mouth daily.    . fenofibrate 160 MG tablet Take 1 tablet (  160 mg total) by mouth daily. 90 tablet 3  . gabapentin (NEURONTIN) 100 MG capsule Take 1 capsule (100 mg total) by mouth daily. (Patient not taking: Reported on 12/01/2018) 90 capsule 3  . lidocaine (LIDODERM) 5 % Place 1 patch onto the skin daily. Remove & Discard patch within 12 hours or as directed by MD 30 patch 0  . lisinopril (PRINIVIL,ZESTRIL) 5 MG tablet Take 1 tablet (5 mg total) by mouth daily. 90 tablet 3  .  metoprolol tartrate (LOPRESSOR) 50 MG tablet TAKE 1 TABLET BY MOUTH TWICE A DAY 180 tablet 1  . pantoprazole (PROTONIX) 20 MG tablet Take 1 tablet (20 mg total) by mouth daily. (Patient not taking: Reported on 12/01/2018) 30 tablet 6   No current facility-administered medications for this visit.    No Known Allergies   Review of Systems: All systems reviewed and negative except where noted in HPI.     Physical Exam:    Physical exam not completed due to the nature of this telehealth communication.  Patient was otherwise alert and oriented and well communicative.   ASSESSMENT AND PLAN;   1) Upper abdominal pain: Upper abdominal pain lasting throughout the day without provoking or alleviating factors.  Discussed the broad DDX to include PUD, gastritis, nonulcer dyspepsia, functional dyspepsia.  Recent CT x2 and labs without hepatobiliary pathology.  Do suspect that there is some element of anxiety playing, related to her underlying dementia.  She certainly seemed scared during our conversation regarding any medications or testing.  She and husband both admit to increasing anxiety, particularly related to dementia.  Plan on evaluation and treatment as below:  -H. pylori serology for test and treat method - Trial of high-dose acid suppression therapy.  Rx for Protonix 40 mg p.o. twice daily x4 weeks, then decrease to 40 mg daily and eventually titrate to lowest effective dose - Levsin 0.125 mg p.o. as needed every 6 hours for pain -Carafate 10 mg every 6 hours for pain -Discussed some dietary relationships with her pain - Depending on response to therapy, if requiring regular use of the Levsin, may benefit from starting a TCA for both neuromodulation and treatment of underlying anxiety.  Would certainly do in concert with her PCM's guidance given her underlying dementia -If no improvement, can plan for EGD once current endoscopic restrictions are lifted related to the COVID-19 pandemic  -Otherwise tolerating p.o. intake and no associated weight loss, vomiting, GI bleed, etc. that should prompt more urgent endoscopic evaluation or referral to ER for admission -RTC in 1 month or sooner as needed  2) CRC screening: No previous colonoscopy.  Due for age-appropriate colon cancer screening.  Deferred pending evaluation treatment of more acute issues as above.   Shellia Cleverly, DO, FACG  12/14/2018, 9:16 AM   Copland, Gwenlyn Found, MD

## 2018-12-14 NOTE — Telephone Encounter (Signed)
Pt husband received 2 medication with the same name (pantoprazole) and would like to know why and when to take them.

## 2018-12-14 NOTE — Patient Instructions (Addendum)
We have sent the following medications to your pharmacy for you to pick up at your convenience: Protonix 40mg  by mouth twice a day for 4 weeks then decrease to daily. Levsin 0.125mg  every 6 hours as needed. Carafate 10mg  every 6 hours as needed.  Please call our office at [phone] to set up your 1-2 month follow up visit.  It was a pleasure to see you today!  Vito Cirigliano, D.O.

## 2018-12-15 ENCOUNTER — Other Ambulatory Visit (INDEPENDENT_AMBULATORY_CARE_PROVIDER_SITE_OTHER): Payer: Medicare Other

## 2018-12-15 DIAGNOSIS — Z1211 Encounter for screening for malignant neoplasm of colon: Secondary | ICD-10-CM | POA: Diagnosis not present

## 2018-12-15 DIAGNOSIS — R1010 Upper abdominal pain, unspecified: Secondary | ICD-10-CM | POA: Diagnosis not present

## 2018-12-15 DIAGNOSIS — Z1212 Encounter for screening for malignant neoplasm of rectum: Secondary | ICD-10-CM

## 2018-12-15 LAB — H. PYLORI ANTIBODY, IGG: H Pylori IgG: NEGATIVE

## 2018-12-15 NOTE — Telephone Encounter (Signed)
Patient has been notified of how to take medications and when to take medications. Verbalized understanding.

## 2018-12-21 ENCOUNTER — Emergency Department (HOSPITAL_BASED_OUTPATIENT_CLINIC_OR_DEPARTMENT_OTHER)
Admission: EM | Admit: 2018-12-21 | Discharge: 2018-12-21 | Disposition: A | Discharge status: Home / Self Care | Payer: Medicare Other | Attending: Emergency Medicine | Admitting: Emergency Medicine

## 2018-12-21 ENCOUNTER — Other Ambulatory Visit: Payer: Self-pay

## 2018-12-21 ENCOUNTER — Encounter (HOSPITAL_BASED_OUTPATIENT_CLINIC_OR_DEPARTMENT_OTHER): Payer: Self-pay | Admitting: *Deleted

## 2018-12-21 DIAGNOSIS — F0390 Unspecified dementia without behavioral disturbance: Secondary | ICD-10-CM | POA: Diagnosis not present

## 2018-12-21 DIAGNOSIS — Z87891 Personal history of nicotine dependence: Secondary | ICD-10-CM | POA: Insufficient documentation

## 2018-12-21 DIAGNOSIS — R1013 Epigastric pain: Secondary | ICD-10-CM | POA: Insufficient documentation

## 2018-12-21 DIAGNOSIS — Z7982 Long term (current) use of aspirin: Secondary | ICD-10-CM | POA: Diagnosis not present

## 2018-12-21 DIAGNOSIS — I10 Essential (primary) hypertension: Secondary | ICD-10-CM | POA: Diagnosis not present

## 2018-12-21 DIAGNOSIS — Z79899 Other long term (current) drug therapy: Secondary | ICD-10-CM | POA: Insufficient documentation

## 2018-12-21 DIAGNOSIS — M546 Pain in thoracic spine: Secondary | ICD-10-CM

## 2018-12-21 DIAGNOSIS — M5030 Other cervical disc degeneration, unspecified cervical region: Secondary | ICD-10-CM

## 2018-12-21 MED ORDER — GABAPENTIN 100 MG PO CAPS: 100.0000 mg | ORAL_CAPSULE | Freq: Every day | ORAL | 3 refills | Status: DC

## 2018-12-21 MED ORDER — ALUM & MAG HYDROXIDE-SIMETH 200-200-20 MG/5ML PO SUSP
30.0000 mL | Freq: Once | ORAL | Status: AC
  Administered 2018-12-21: 30 mL via ORAL
  Filled 2018-12-21: qty 30

## 2018-12-21 MED ORDER — ACETAMINOPHEN 325 MG PO TABS
650.0000 mg | ORAL_TABLET | Freq: Once | ORAL | Status: AC
  Administered 2018-12-21: 650 mg via ORAL
  Filled 2018-12-21: qty 2

## 2018-12-21 MED ORDER — LIDOCAINE VISCOUS HCL 2 % MT SOLN
15.0000 mL | Freq: Once | OROMUCOSAL | Status: AC
  Administered 2018-12-21: 15 mL via ORAL
  Filled 2018-12-21: qty 15

## 2018-12-21 NOTE — Discharge Instructions (Signed)
Continue taking the medications recommended by the GI doctor however we will also start you back on your gabapentin and hopefully that will give you improvement in your pain.

## 2018-12-21 NOTE — ED Provider Notes (Signed)
[facility] EMERGENCY DEPARTMENT Provider Note   CSN: [id number] Arrival date & time: 12/21/18  1545    History   Chief Complaint Chief Complaint  Patient presents with   Abdominal Pain    HPI Tina Fitzpatrick is a 68 y.o. female.     Patient is a 68 year old female with a history of dementia, depression, hypertension who is presenting with persistent epigastric pain.  Patient has been seen multiple times in the last month for this same pain.  She has had 2 CAT scans both without significant findings as well as blood work that was normal.  Patient saw GI on 12/14/2018 and at that time they were concerned this could be dyspepsia or something else stomach related.  She did get tested for H. pylori and was negative.  They started her on Protonix twice daily, Levsin, Carafate which patient has been taking for the last 6 days but husband states her pain seems to be getting worse.  It does not seem to be related to eating and the only thing she can related to is certain positions seem to make it better and other positions seem to make it worse.  She describes it as a tight band around her middle that radiates into her back.  Speaking with her husband he states that she ran out of her gabapentin about 3 weeks ago and when he tried to call to get it refilled nobody ever called him back.  Since she has been out of this medication she has seemed to be complaining a lot of pain.  She had been on the medication for about a year.  The history is provided by the patient.  Abdominal Pain  Pain location:  Epigastric Pain quality: aching, dull and gnawing   Pain radiates to:  Back Pain severity:  Moderate Onset quality:  Gradual Duration: present intermittently over the last few weeks but worse in the last week. Timing:  Constant Progression:  Waxing and waning Chronicity:  New Context: not alcohol use, not awakening from sleep, not eating, not sick contacts and not trauma   Context comment:   Pt had been on gabapentin and ran out 3 weeks ago and husband called but was not able to get the meds filled.  he feels like the pain started around the same time. Relieved by: better when lying in certain positions. Worsened by:  Nothing Ineffective treatments: Has been on PPIs and is now taking Levsin and Carafate that did not seem to be helping at all. 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 10/31/2015   Orthostatic hypotension 10/30/2015   Mild dementia (HCC) 07/29/2015   Encephalopathy, metabolic 06/24/2015   Syncope 51/83/3582   GERD (gastroesophageal reflux disease)    Essential hypertension    Hyperlipidemia    Depression    DDD (degenerative disc disease), cervical     Past  Surgical History:  Procedure Laterality Date   kidney stones  1988   NECK SURGERY  2008   Plate/Screws     OB History    Gravida  2   Para  2   Term  2   Preterm      AB      Living  2     SAB      TAB      Ectopic      Multiple      Live Births  2            Home Medications    Prior to Admission medications   Medication Sig Start Date End Date Taking? Authorizing Provider  ALPRAZolam (XANAX) 0.25 MG tablet Take 1 tablet (0.25 mg total) by mouth 2 (two) times daily. 06/30/18   Copland, Gwenlyn FoundJessica C, MD  amoxicillin-clavulanate (AUGMENTIN) 875-125 MG tablet Take 1 tablet by mouth 2 (two) times  daily. 11/17/18   Copland, Gwenlyn FoundJessica C, MD  aspirin 81 MG tablet Take 81 mg by mouth daily.    [provider]  fenofibrate 160 MG tablet Take 1 tablet (160 mg total) by mouth daily. 12/15/17   Copland, Gwenlyn FoundJessica C, MD  gabapentin (NEURONTIN) 100 MG capsule Take 1 capsule (100 mg total) by mouth daily. 12/21/18   Gwyneth SproutPlunkett, Ahria Slappey, MD  hyoscyamine (LEVSIN SL) 0.125 MG SL tablet Place 1 tablet (0.125 mg total) under the tongue every 6 (six) hours as needed. 12/14/18   Cirigliano, Vito V, DO  lidocaine (LIDODERM) 5 % Place 1 patch onto the skin daily. Remove & Discard patch within 12 hours or as directed by MD 12/03/18   Tilden Fossaees, Elizabeth, MD  lisinopril (PRINIVIL,ZESTRIL) 5 MG tablet Take 1 tablet (5 mg total) by mouth daily. 12/15/17   Copland, Gwenlyn FoundJessica C, MD  metoprolol tartrate (LOPRESSOR) 50 MG tablet TAKE 1 TABLET BY MOUTH TWICE A DAY 12/13/18   Copland, Gwenlyn FoundJessica C, MD  pantoprazole (PROTONIX) 40 MG tablet Take 1 tablet (40 mg total) by mouth 2 (two) times daily for 28 days. 12/14/18 01/11/19  Cirigliano, Vito V, DO  pantoprazole (PROTONIX) 40 MG tablet Take 1 tablet (40 mg total) by mouth daily. 01/11/19   Cirigliano, Vito V, DO  sucralfate (CARAFATE) 1 GM/10ML suspension Take 10 mLs (1 g total) by mouth every 6 (six) hours as needed. 12/14/18   Cirigliano, Verlin DikeVito V, DO    Family History Family History  Problem Relation Age of Onset   Heart attack Mother     Social History Social History   Tobacco Use   Smoking status: Former Smoker    Types: Cigarettes   Smokeless tobacco: Never Used  Substance Use Topics   Alcohol use: No    Alcohol/week: 0.0 standard drinks   Drug use: No     Allergies   Patient has no known allergies.   Review of Systems Review of Systems  Constitutional: Negative for fever.  Respiratory: Negative for cough.   Cardiovascular: Negative for chest pain.  Gastrointestinal: Positive for abdominal pain. Negative for anorexia, constipation, diarrhea, nausea and  vomiting.  Genitourinary: Negative for dysuria.  All other systems reviewed and are negative.    Physical Exam Updated Vital Signs BP 116/72 (BP Location: Left Arm)    Pulse 88    Temp 98.2 F (36.8 C) (Oral)    Resp 14    Ht 5\' 7"  (1.702 m)    Wt 76.7 kg    SpO2 100%    BMI  26.47 kg/m   Physical Exam Vitals signs and nursing note reviewed.  Constitutional:      General: She is in acute distress.     Appearance: She is well-developed.  HENT:     Head: Normocephalic and atraumatic.  Eyes:     Pupils: Pupils are equal, round, and reactive to light.  Cardiovascular:     Rate and Rhythm: Normal rate and regular rhythm.     Heart sounds: Normal heart sounds. No murmur. No friction rub.  Pulmonary:     Effort: Pulmonary effort is normal.     Breath sounds: Normal breath sounds. No wheezing or rales.  Abdominal:     General: Bowel sounds are normal. There is no distension.     Palpations: Abdomen is soft.     Tenderness: There is abdominal tenderness in the right upper quadrant, epigastric area and left upper quadrant. There is no guarding or rebound.     Hernia: No hernia is present.  Musculoskeletal: Normal range of motion.     Thoracic back: She exhibits tenderness. She exhibits normal range of motion.       Back:     Comments: No edema  Skin:    General: Skin is warm and dry.     Findings: No rash.  Neurological:     General: No focal deficit present.     Mental Status: She is alert. Mental status is at baseline.     Cranial Nerves: No cranial nerve deficit.     Comments: Oriented to person and place  Psychiatric:        Mood and Affect: Mood normal.        Behavior: Behavior normal.      ED Treatments / Results  Labs (all labs ordered are listed, but only abnormal results are displayed) Labs Reviewed - No data to display  EKG None  Radiology No results found.  Procedures Procedures (including critical care time)  Medications Ordered in ED Medications    acetaminophen (TYLENOL) tablet 650 mg (650 mg Oral Given 12/21/18 1638)  alum & mag hydroxide-simeth (MAALOX/MYLANTA) 200-200-20 MG/5ML suspension 30 mL (30 mLs Oral Given 12/21/18 1639)    And  lidocaine (XYLOCAINE) 2 % viscous mouth solution 15 mL (15 mLs Oral Given 12/21/18 1639)     Initial Impression / Assessment and Plan / ED Course  I have reviewed the triage vital signs and the nursing notes.  Pertinent labs & imaging results that were available during my care of the patient were reviewed by me and considered in my medical decision making (see chart for details).       Patient is an elderly female presenting today with bandlike abdominal pain in the epigastric area radiating into the back.  Patient has had extensive work-up for this pain in the last few weeks with 2 CAT scans, blood work, GI consult, H. pylori testing all which has been normal.  Patient has increased her Pepcid to twice daily and is also on Levsin and Carafate all which has been states have not seem to affect the pain at all.  Patient is having no infectious symptoms and she still eating normally.  She is having normal bowel movements and denies any nausea or vomiting.  On exam patient is well-appearing.  She has some mild tenderness with palpation over her upper abdomen but no rebound or guarding.  Low suspicion that this is cholecystitis or pancreatitis.  She does not drink alcohol or use drugs.  Her vital  signs are normal.  Given recent extensive work-up do not feel these tests need to be repeated.  Speaking with her husband she has been off gabapentin now for approximately 3 weeks and he feels like the pain is worsening.  He says she does have a lot of back issues and concerned maybe this pain is more musculoskeletal.  It is bilateral and there is no sign of rash or concern for zoster.  Low suspicion that this is from dissection or discitis or osteomyelitis in the back.  Will start patient back on her gabapentin to see if that  improves her symptoms.  She had minimal relief with a GI cocktail.  She will continue on the GI medications and they will plan on doing an endoscopy if symptoms do not improve.  Recommended she also follow-up with Dr. Dallas Schimke in 1 week if symptoms are not improved after going back on gabapentin.  Final Clinical Impressions(s) / ED Diagnoses   Final diagnoses:  Epigastric abdominal pain  Bilateral thoracic back pain, unspecified chronicity    ED Discharge Orders         Ordered    gabapentin (NEURONTIN) 100 MG capsule  Daily     12/21/18 1641           Gwyneth Sprout, MD 12/21/18 1655

## 2018-12-21 NOTE — ED Triage Notes (Addendum)
C/o epigastric pain  " off an on" x 1 week , seen by GI 4/1

## 2018-12-22 ENCOUNTER — Ambulatory Visit: Payer: Self-pay | Admitting: Family Medicine

## 2018-12-27 ENCOUNTER — Other Ambulatory Visit: Payer: Self-pay | Admitting: Family Medicine

## 2018-12-27 DIAGNOSIS — E785 Hyperlipidemia, unspecified: Secondary | ICD-10-CM

## 2018-12-27 DIAGNOSIS — F411 Generalized anxiety disorder: Secondary | ICD-10-CM

## 2018-12-27 MED ORDER — ALPRAZOLAM 0.25 MG PO TABS: 0.2500 mg | ORAL_TABLET | Freq: Two times a day (BID) | ORAL | 1 refills | Status: DC

## 2018-12-27 NOTE — Telephone Encounter (Signed)
Copied from CRM [id number]. Topic: Quick Communication - Rx Refill/Question >> Dec 27, 2018  9:08 AM Jolayne Haines L wrote: Medication: ALPRAZolam Prudy Feeler) 0.25 MG tablet & fenofibrate 160 MG tablet  Has the patient contacted their pharmacy? Yes - no refills  (Agent: If no, request that the patient contact the pharmacy for the refill.) (Agent: If yes, when and what did the pharmacy advise?)  Preferred Pharmacy (with phone number or street name): [id number] [place], [place] - [address] [place] [address] Phone: [phone] Fax: [phone]    Agent: Please be advised that RX refills may take up to 3 business days. We ask that you follow-up with your pharmacy.

## 2019-01-03 ENCOUNTER — Telehealth: Payer: Self-pay | Admitting: Family Medicine

## 2019-01-03 ENCOUNTER — Other Ambulatory Visit: Payer: Self-pay | Admitting: Gastroenterology

## 2019-01-03 DIAGNOSIS — I10 Essential (primary) hypertension: Secondary | ICD-10-CM

## 2019-01-03 NOTE — Telephone Encounter (Signed)
Copied from CRM [id number]. Topic: Quick Communication - Rx Refill/Question >> Jan 03, 2019  3:58 PM Mcneil, Ja-Kwan wrote: Medication: lisinopril (PRINIVIL,ZESTRIL) 5 MG tablet  Has the patient contacted their pharmacy? yes   Preferred Pharmacy (with phone number or street name): [id number] [place] [address], [place] - [address] [phone] (Phone) [phone] (Fax)  Agent: Please be advised that RX refills may take up to 3 business days. We ask that you follow-up with your pharmacy.

## 2019-01-04 MED ORDER — LISINOPRIL 5 MG PO TABS: 5.0000 mg | ORAL_TABLET | Freq: Every day | ORAL | 1 refills | Status: DC

## 2019-01-04 NOTE — Telephone Encounter (Signed)
Refills sent to pharmacy. 

## 2019-01-05 ENCOUNTER — Other Ambulatory Visit: Payer: Self-pay | Admitting: Gastroenterology

## 2019-01-20 ENCOUNTER — Ambulatory Visit: Payer: Self-pay | Admitting: Family Medicine

## 2019-02-23 ENCOUNTER — Other Ambulatory Visit (HOSPITAL_BASED_OUTPATIENT_CLINIC_OR_DEPARTMENT_OTHER): Payer: Medicare Other

## 2019-03-01 ENCOUNTER — Ambulatory Visit: Payer: Medicare Other | Admitting: Obstetrics & Gynecology

## 2019-03-16 ENCOUNTER — Other Ambulatory Visit: Payer: Self-pay | Admitting: Family Medicine

## 2019-03-16 DIAGNOSIS — R1013 Epigastric pain: Secondary | ICD-10-CM

## 2019-04-04 IMAGING — CT CT ABDOMEN AND PELVIS WITH CONTRAST
2 of 5 series · 16 of 46 positions shown, 18 images · IV contrast (APPLIED)
Comparison: Plain film from 11/22/2018, CT from 11/10/2018

CLINICAL DATA: Left-sided abdominal pain

EXAM:
CT ABDOMEN AND PELVIS WITH CONTRAST
TECHNIQUE: Multidetector CT imaging of the abdomen and pelvis was performed
using the standard protocol following bolus administration of
intravenous contrast.
CONTRAST:  100mL OMNIPAQUE IOHEXOL 300 MG/ML  SOLN

[Series 2: axial st · axial · 0.77mm/px · z∈[-493,-68]mm · 13 of 95 slices shown, 15 images]
[im 5/95  soft-tissue]
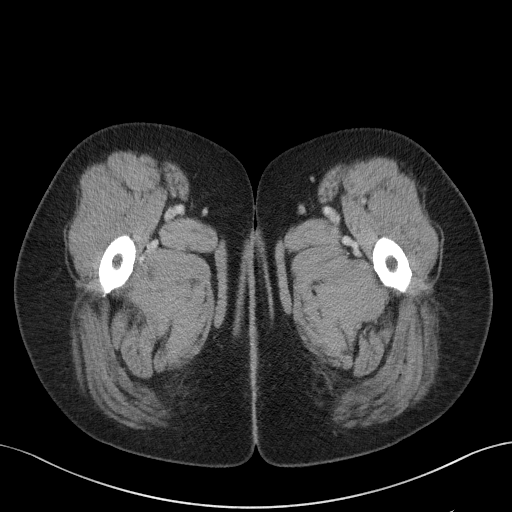
[im 5/95  bone]
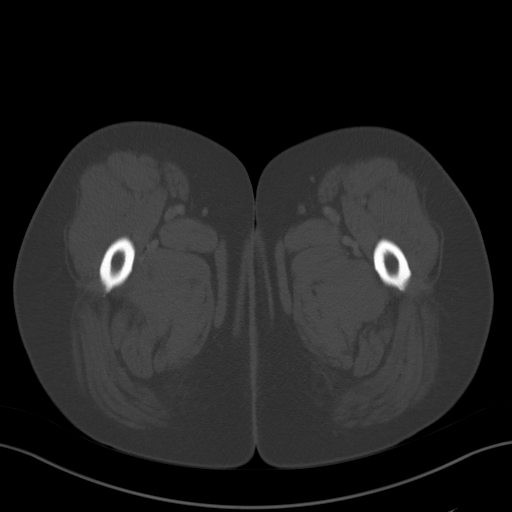
[im 15/95  soft-tissue]
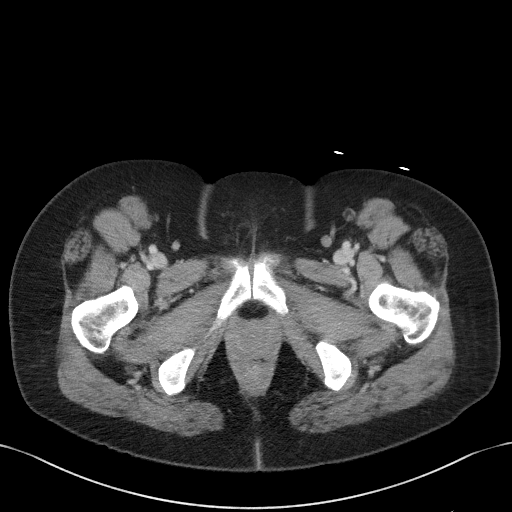
[im 19/95  soft-tissue]
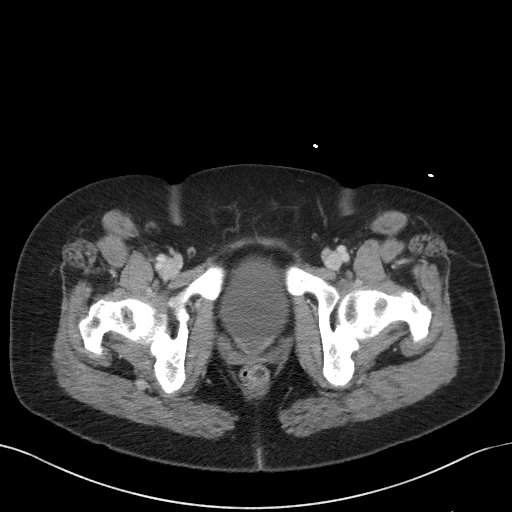
[im 29/95  soft-tissue]
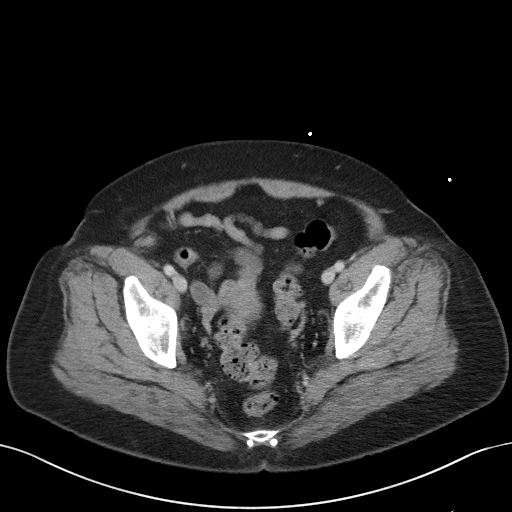
[im 33/95  soft-tissue]
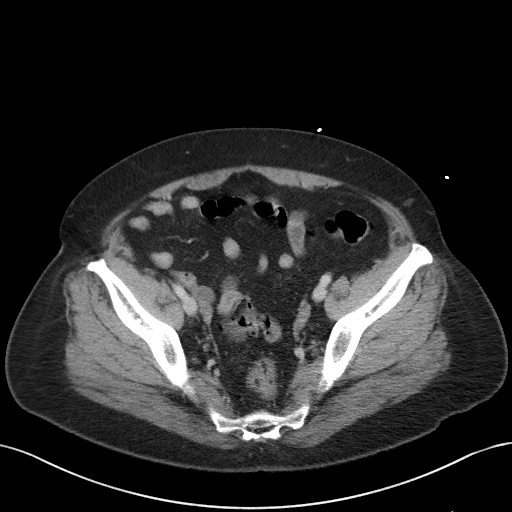
[im 43/95  soft-tissue]
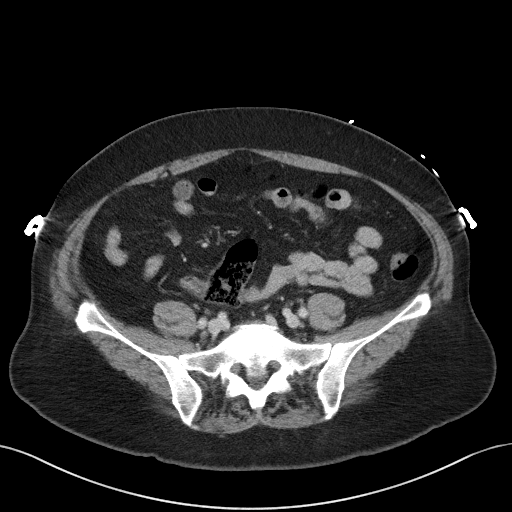
[im 48/95  soft-tissue]
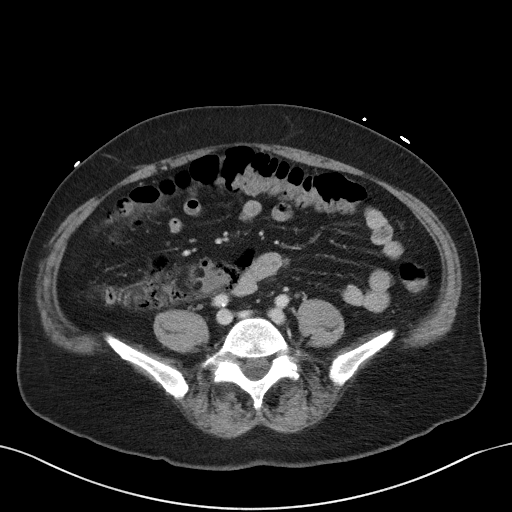
[im 52/95  soft-tissue]
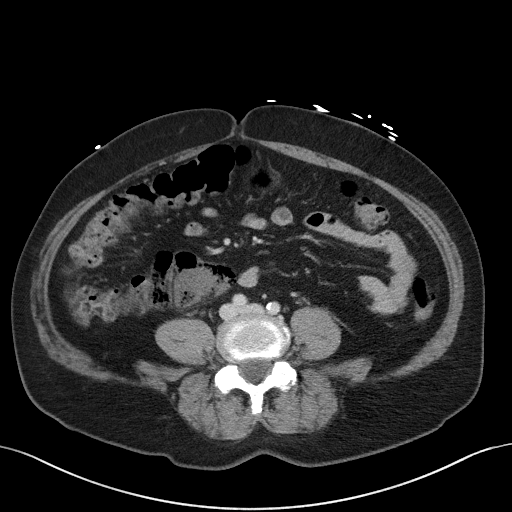
[im 62/95  soft-tissue]
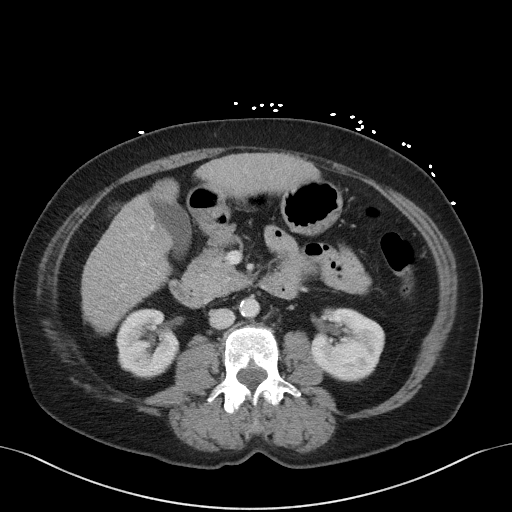
[im 62/95  bone]
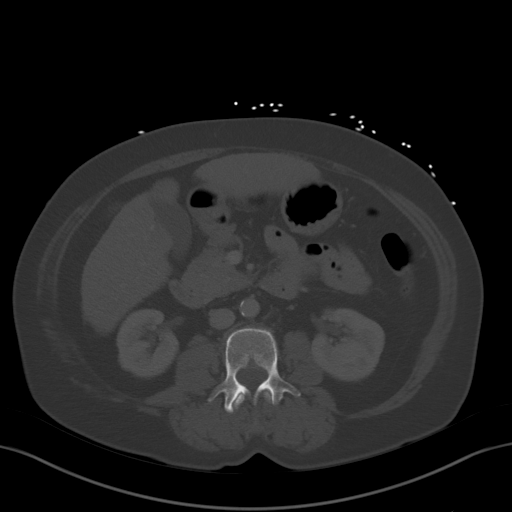
[im 66/95  soft-tissue]
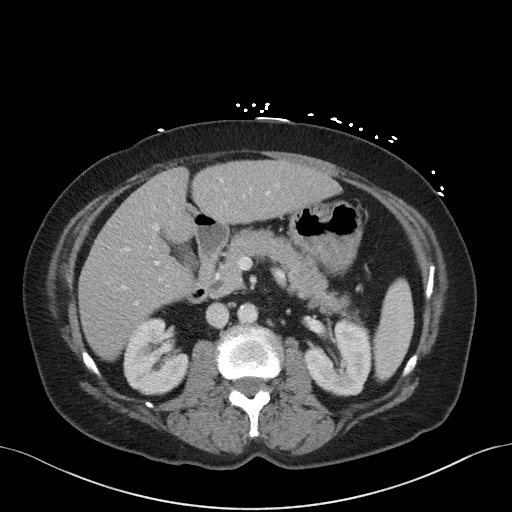
[im 76/95  soft-tissue]
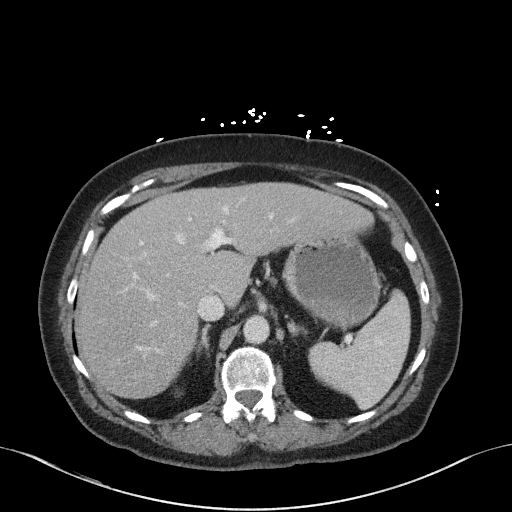
[im 80/95  soft-tissue]
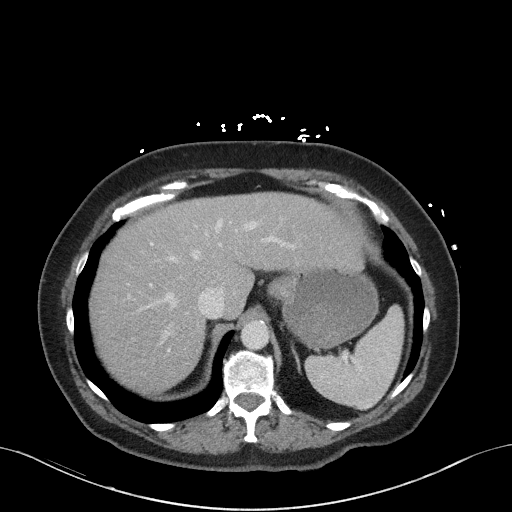
[im 90/95  soft-tissue]
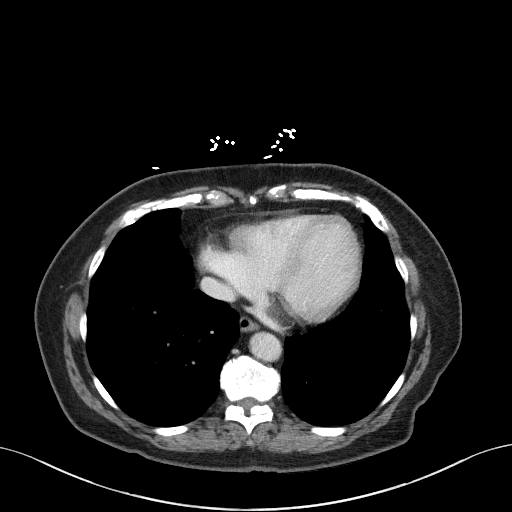

[Series 5: coronal st · coronal · 0.82mm/px · 3 of 101 slices shown]
[im 34/101  soft-tissue]
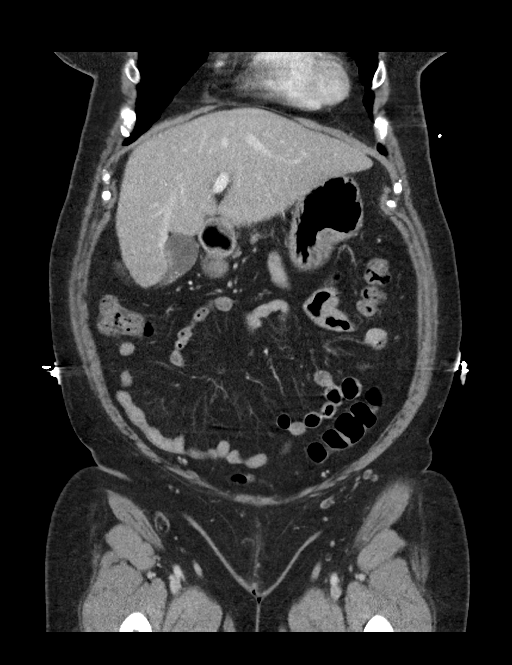
[im 45/101  soft-tissue]
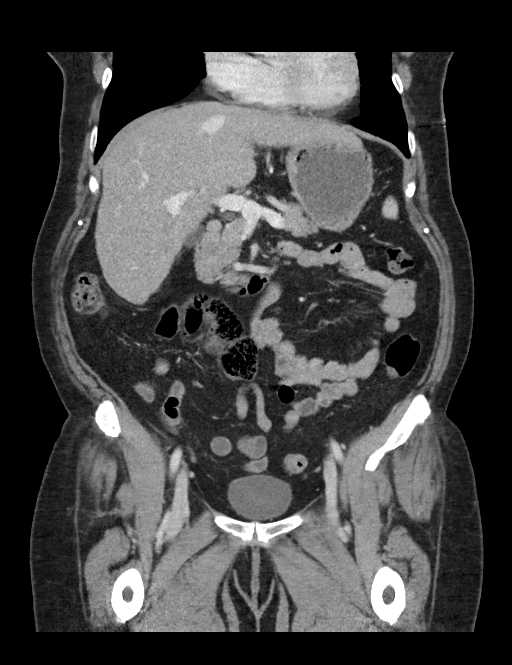
[im 56/101  soft-tissue]
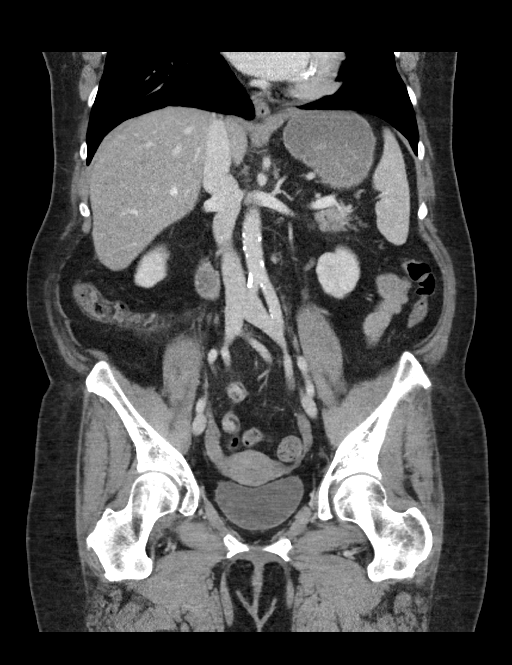

[16 of 46 positions shown; findings below may reference images not displayed]

FINDINGS: Lower chest: No acute abnormality.

Hepatobiliary: Fatty infiltration of the liver is noted. The
gallbladder is well distended. Dependent gallstone is noted. This
was not as well appreciated on the prior exam.

Pancreas: Unremarkable. No pancreatic ductal dilatation or
surrounding inflammatory changes.

Spleen: Normal in size without focal abnormality.

Adrenals/Urinary Tract: Adrenal glands are within normal limits.
Kidneys are well visualized bilaterally. No renal calculi or
obstructive changes are seen. The bladder is well distended.

Stomach/Bowel: Scattered mild diverticular change of the colon is
seen. The ileocecal valve is lipomatous in nature. The appendix is
not well visualized. No inflammatory changes to suggest appendicitis
are seen.

Vascular/Lymphatic: Aortic atherosclerosis. No enlarged abdominal or
pelvic lymph nodes.

Reproductive: Uterus and bilateral adnexa are unremarkable.

Other: No abdominal wall hernia or abnormality. No abdominopelvic
ascites.

Musculoskeletal: Mild degenerative changes of lumbar spine.
IMPRESSION: Cholelithiasis without complicating factors.

Diverticular change without diverticulitis.

No acute abnormality is noted.

## 2019-04-21 ENCOUNTER — Emergency Department (HOSPITAL_BASED_OUTPATIENT_CLINIC_OR_DEPARTMENT_OTHER): Payer: Medicare Other

## 2019-04-21 ENCOUNTER — Other Ambulatory Visit: Payer: Self-pay

## 2019-04-21 ENCOUNTER — Encounter (HOSPITAL_BASED_OUTPATIENT_CLINIC_OR_DEPARTMENT_OTHER): Payer: Self-pay | Admitting: *Deleted

## 2019-04-21 ENCOUNTER — Emergency Department (HOSPITAL_BASED_OUTPATIENT_CLINIC_OR_DEPARTMENT_OTHER)
Admission: EM | Admit: 2019-04-21 | Discharge: 2019-04-21 | Disposition: A | Discharge status: Home / Self Care | Payer: Medicare Other | Attending: Emergency Medicine | Admitting: Emergency Medicine

## 2019-04-21 DIAGNOSIS — Z87891 Personal history of nicotine dependence: Secondary | ICD-10-CM | POA: Diagnosis not present

## 2019-04-21 DIAGNOSIS — R1012 Left upper quadrant pain: Secondary | ICD-10-CM | POA: Diagnosis present

## 2019-04-21 DIAGNOSIS — Z79899 Other long term (current) drug therapy: Secondary | ICD-10-CM | POA: Insufficient documentation

## 2019-04-21 DIAGNOSIS — I10 Essential (primary) hypertension: Secondary | ICD-10-CM | POA: Diagnosis not present

## 2019-04-21 DIAGNOSIS — F0390 Unspecified dementia without behavioral disturbance: Secondary | ICD-10-CM | POA: Diagnosis not present

## 2019-04-21 DIAGNOSIS — Z7982 Long term (current) use of aspirin: Secondary | ICD-10-CM | POA: Insufficient documentation

## 2019-04-21 DIAGNOSIS — R109 Unspecified abdominal pain: Secondary | ICD-10-CM | POA: Diagnosis not present

## 2019-04-21 DIAGNOSIS — R1084 Generalized abdominal pain: Secondary | ICD-10-CM | POA: Insufficient documentation

## 2019-04-21 LAB — URINALYSIS, ROUTINE W REFLEX MICROSCOPIC
Bilirubin Urine: NEGATIVE
Glucose, UA: NEGATIVE mg/dL
Hgb urine dipstick: NEGATIVE
Ketones, ur: NEGATIVE mg/dL
Leukocytes,Ua: NEGATIVE
Nitrite: NEGATIVE
Protein, ur: NEGATIVE mg/dL
Specific Gravity, Urine: 1.02 (ref 1.005–1.030)
pH: 7.5 (ref 5.0–8.0)

## 2019-04-21 LAB — COMPREHENSIVE METABOLIC PANEL
ALT: 19 U/L (ref 0–44)
AST: 22 U/L (ref 15–41)
Albumin: 4.4 g/dL (ref 3.5–5.0)
Alkaline Phosphatase: 44 U/L (ref 38–126)
Anion gap: 10 (ref 5–15)
BUN: 16 mg/dL (ref 8–23)
CO2: 22 mmol/L (ref 22–32)
Calcium: 9.5 mg/dL (ref 8.9–10.3)
Chloride: 107 mmol/L (ref 98–111)
Creatinine, Ser: 0.97 mg/dL (ref 0.44–1.00)
GFR calc Af Amer: >60 mL/min (ref >60)
GFR calc non Af Amer: >60 mL/min (ref >60)
Glucose, Bld: 113 mg/dL — ABNORMAL HIGH (ref 70–99)
Potassium: 3.7 mmol/L (ref 3.5–5.1)
Sodium: 139 mmol/L (ref 135–145)
Total Bilirubin: 0.8 mg/dL (ref 0.3–1.2)
Total Protein: 7.1 g/dL (ref 6.5–8.1)

## 2019-04-21 LAB — CBC
HCT: 38.7 % (ref 36.0–46.0)
Hemoglobin: 12.9 g/dL (ref 12.0–15.0)
MCH: 30.4 pg (ref 26.0–34.0)
MCHC: 33.3 g/dL (ref 30.0–36.0)
MCV: 91.1 fL (ref 80.0–100.0)
Platelets: 181 10*3/uL (ref 150–400)
RBC: 4.25 MIL/uL (ref 3.87–5.11)
RDW: 12.9 % (ref 11.5–15.5)
WBC: 5.6 10*3/uL (ref 4.0–10.5)
nRBC: 0 % (ref 0.0–0.2)

## 2019-04-21 LAB — LIPASE, BLOOD: Lipase: 38 U/L (ref 11–51)

## 2019-04-21 MED ORDER — SODIUM CHLORIDE 0.9% FLUSH
3.0000 mL | Freq: Once | INTRAVENOUS | Status: DC
  Filled 2019-04-21: qty 3

## 2019-04-21 MED ORDER — LIDOCAINE VISCOUS HCL 2 % MT SOLN
15.0000 mL | Freq: Once | OROMUCOSAL | Status: AC
  Administered 2019-04-21: 15 mL via ORAL
  Filled 2019-04-21: qty 15

## 2019-04-21 MED ORDER — SUCRALFATE 1 GM/10ML PO SUSP: 1.0000 g | Freq: Four times a day (QID) | ORAL | 1 refills | Status: DC | PRN

## 2019-04-21 MED ORDER — ALUM & MAG HYDROXIDE-SIMETH 200-200-20 MG/5ML PO SUSP
30.0000 mL | Freq: Once | ORAL | Status: AC
  Administered 2019-04-21: 30 mL via ORAL
  Filled 2019-04-21: qty 30

## 2019-04-21 NOTE — ED Notes (Signed)
Pt. Husband reports to RN the Pt. Called him at work reporting she was hurting under her rib cage in her abd.   Pt. Reports she feels pregnant to the RN and states she does not want to be pregnant.  RN ask Pt. About last period and Pt. Reports last period was 10 or more years ago.  Pt. Reports she is still sexually active and her husband knows more about sex and getting pregnant more than she does.  Pt. Has dementia.

## 2019-04-21 NOTE — Discharge Instructions (Addendum)
Please read and follow all provided instructions.  Your diagnoses today include:  1. Generalized abdominal pain     Tests performed today include:  Blood counts and electrolytes  Blood tests to check liver and kidney function  Blood tests to check pancreas function  Urine test to look for infection  X-ray of the abdomen - shows some stool but no signs of blockage  Vital signs. See below for your results today.   Medications prescribed:   Carafate - for stomach upset and to protect your stomach  Take any prescribed medications only as directed.  Home care instructions:   Follow any educational materials contained in this packet.  Please make sure you are taking the Protonix (also called pantoprazole) prescribed by your doctor 1 month ago. You should have refills at your pharmacy.   Follow-up instructions: Please follow-up with your primary care provider in the next 3days for further evaluation of your symptoms.    Return instructions:  SEEK IMMEDIATE MEDICAL ATTENTION IF:  The pain does not go away or becomes severe   A temperature above 101F develops   Repeated vomiting occurs (multiple episodes)   The pain becomes localized to portions of the abdomen. The right side could possibly be appendicitis. In an adult, the left lower portion of the abdomen could be colitis or diverticulitis.   Blood is being passed in stools or vomit (bright red or black tarry stools)   You develop chest pain, difficulty breathing, dizziness or fainting, or become confused, poorly responsive, or inconsolable (young children)  If you have any other emergent concerns regarding your health  Additional Information: Abdominal (belly) pain can be caused by many things. Your caregiver performed an examination and possibly ordered blood/urine tests and imaging (CT scan, x-rays, ultrasound). Many cases can be observed and treated at home after initial evaluation in the emergency department. Even  though you are being discharged home, abdominal pain can be unpredictable. Therefore, you need a repeated exam if your pain does not resolve, returns, or worsens. Most patients with abdominal pain don't have to be admitted to the hospital or have surgery, but serious problems like appendicitis and gallbladder attacks can start out as nonspecific pain. Many abdominal conditions cannot be diagnosed in one visit, so follow-up evaluations are very important.  Your vital signs today were: BP (!) 142/76 (BP Location: Left Arm)    Pulse 75    Temp 98.4 F (36.9 C) (Oral)    Resp 20    Ht 5\' 5"  (1.651 m)    Wt 74.7 kg    SpO2 100%    BMI 27.41 kg/m  If your blood pressure (bp) was elevated above 135/85 this visit, please have this repeated by your doctor within one month. --------------

## 2019-04-21 NOTE — ED Notes (Signed)
EDP speaking with Pt. Husband at this time about Pt. Findings and further care.

## 2019-04-21 NOTE — ED Provider Notes (Signed)
[facility] EMERGENCY DEPARTMENT Provider Note   CSN: [id number] Arrival date & time: 04/21/19  1257     History   Chief Complaint Chief Complaint  Patient presents with  . Abdominal Pain    HPI Tina Fitzpatrick is a 68 y.o. female.     Patient with history of dementia, GERD, gallstones --presents to the emergency department today with ongoing abdominal pain for which she has been seen multiple times over the past 6 months.  She was last seen in the emergency department in 12/2018.  Prior to this she had relatively unremarkable CT scans in February and March.  She does not remember following up with a GI doctor after her last ED visit.  She states that some days she feels fine and other days she feels like she has a discomfort in her upper abdomen.  She describes this as a "knot" in her left upper quadrant which then moves across her right upper abdomen below her ribs.  No chest pain or shortness of breath.  No constipation or diarrhea.  She reports some dysuria to the RN but denies any blood or increased frequency urgency.  Patient states that sometimes she sees blood in her stool but has not seen us in a while.  No treatments prior to arrival.  She is on PPI medication per her medication list as well as gabapentin which was refilled at her ED visit 4 months ago.  Onset of symptoms acute.  Course is intermittent.  Nothing makes symptoms better or worse.     Past Medical History:  Diagnosis Date  . DDD (degenerative disc disease), cervical   . Dementia (HCC)   . Depression   . GERD (gastroesophageal reflux disease)   . Hyperlipidemia   . Hypertension   . Kidney stones     Patient Active Problem List   Diagnosis Date Noted  . Normochromic normocytic anemia 09/09/2017  . Acute lower UTI 09/09/2017  . Acute encephalopathy 09/08/2017  . 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 Orthostatic hypotension 10/30/2015  . Mild dementia (HCC)  07/29/2015  . Encephalopathy, metabolic 06/24/2015  . Syncope 06/22/2013  . GERD (gastroesophageal reflux disease)   . Essential hypertension   . Hyperlipidemia   . Depression   . DDD (degenerative disc disease), cervical     Past Surgical History:  Procedure Laterality Date  . kidney stones  1988  . NECK SURGERY  2008   Plate/Screws     OB History    Gravida  2   Para  2   Term  2   Preterm      AB      Living  2     SAB      TAB      Ectopic      Multiple      Live Births  2            Home Medications    Prior to Admission medications   Medication Sig Start Date End Date Taking? Authorizing Provider  ALPRAZolam (XANAX) 0.25 MG tablet Take 1 tablet (0.25 mg total) by mouth 2 (two) times daily. 12/27/18   Copland, Gwenlyn FoundJessica C, MD  amoxicillin-clavulanate (AUGMENTIN) 875-125 MG tablet Take 1 tablet by mouth 2 (two) times daily. 11/17/18   Copland, Gwenlyn FoundJessica C, MD  aspirin 81 MG tablet Take 81 mg by mouth daily.    [provider]  fenofibrate 160 MG tablet TAKE 1 TABLET BY  MOUTH EVERY DAY 12/27/18   Copland, Gwenlyn FoundJessica C, MD  gabapentin (NEURONTIN) 100 MG capsule Take 1 capsule (100 mg total) by mouth daily. 12/21/18   Gwyneth SproutPlunkett, Whitney, MD  hyoscyamine (LEVSIN SL) 0.125 MG SL tablet Place 1 tablet (0.125 mg total) under the tongue every 6 (six) hours as needed. 12/14/18   Cirigliano, Vito V, DO  lidocaine (LIDODERM) 5 % Place 1 patch onto the skin daily. Remove & Discard patch within 12 hours or as directed by MD 12/03/18   Tilden Fossaees, Elizabeth, MD  lisinopril (ZESTRIL) 5 MG tablet Take 1 tablet (5 mg total) by mouth daily. 01/04/19   Copland, Gwenlyn FoundJessica C, MD  metoprolol tartrate (LOPRESSOR) 50 MG tablet TAKE 1 TABLET BY MOUTH TWICE A DAY 12/13/18   Copland, Gwenlyn FoundJessica C, MD  pantoprazole (PROTONIX) 20 MG tablet TAKE 1 TABLET BY MOUTH EVERY DAY 03/16/19   Copland, Gwenlyn FoundJessica C, MD  pantoprazole (PROTONIX) 40 MG tablet Take 1 tablet (40 mg total) by mouth 2 (two) times daily for  28 days. 12/14/18 01/11/19  Cirigliano, Vito V, DO  pantoprazole (PROTONIX) 40 MG tablet Take 1 tablet (40 mg total) by mouth daily. 01/11/19   Cirigliano, Vito V, DO  sucralfate (CARAFATE) 1 GM/10ML suspension Take 10 mLs (1 g total) by mouth every 6 (six) hours as needed. 12/14/18   Cirigliano, Verlin DikeVito V, DO    Family History Family History  Problem Relation Age of Onset  . Heart attack Mother     Social History Social History   Tobacco Use  . Smoking status: Former Smoker    Types: Cigarettes  . Smokeless tobacco: Never Used  Substance Use Topics  . Alcohol use: No    Alcohol/week: 0.0 standard drinks  . Drug use: No     Allergies   Patient has no known allergies.   Review of Systems Review of Systems  Constitutional: Negative for fever.  HENT: Negative for rhinorrhea and sore throat.   Eyes: Negative for redness.  Respiratory: Negative for cough.   Cardiovascular: Negative for chest pain.  Gastrointestinal: Positive for abdominal pain. Negative for diarrhea, nausea and vomiting.  Genitourinary: Negative for dysuria.  Musculoskeletal: Negative for myalgias.  Skin: Negative for rash.  Neurological: Negative for headaches.     Physical Exam Updated Vital Signs BP (!) 142/76 (BP Location: Left Arm)   Pulse 75   Temp 98.4 F (36.9 C) (Oral)   Resp 20   Ht 5\' 5"  (1.651 m)   Wt 74.7 kg   SpO2 100%   BMI 27.41 kg/m   Physical Exam Vitals signs and nursing note reviewed.  Constitutional:      Appearance: She is well-developed.  HENT:     Head: Normocephalic and atraumatic.  Eyes:     General:        Right eye: No discharge.        Left eye: No discharge.     Conjunctiva/sclera: Conjunctivae normal.  Neck:     Musculoskeletal: Normal range of motion and neck supple.  Cardiovascular:     Rate and Rhythm: Normal rate and regular rhythm.     Heart sounds: Normal heart sounds.  Pulmonary:     Effort: Pulmonary effort is normal.     Breath sounds: Normal breath  sounds.  Abdominal:     Palpations: Abdomen is soft.     Tenderness: There is generalized abdominal tenderness (Patient winces with palpation in any area of the abdomen but states that the pain is worse in  the left upper quadrant.). There is no guarding or rebound.     Hernia: No hernia is present.  Skin:    General: Skin is warm and dry.  Neurological:     Mental Status: She is alert.      ED Treatments / Results  Labs (all labs ordered are listed, but only abnormal results are displayed) Labs Reviewed  COMPREHENSIVE METABOLIC PANEL - Abnormal; Notable for the following components:      Result Value   Glucose, Bld 113 (*)    All other components within normal limits  LIPASE, BLOOD  CBC  URINALYSIS, ROUTINE W REFLEX MICROSCOPIC    EKG None  Radiology Dg Abd 2 Views  Result Date: 04/21/2019 CLINICAL DATA:  Abdominal pain EXAM: ABDOMEN - 2 VIEW COMPARISON:  November 21, 2018 abdominal radiograph; CT abdomen and pelvis December 03, 2018 FINDINGS: Supine and upright images obtained. There is moderate stool throughout the colon. There is no bowel dilatation or air-fluid level to suggest bowel obstruction. No free air. No abnormal calcifications. Lung bases clear. IMPRESSION: Moderate stool in colon. No bowel obstruction or free air. Lung bases clear. Electronically Signed   By: Lowella Grip III M.D.   On: 04/21/2019 14:32    Procedures Procedures (including critical care time)  Medications Ordered in ED Medications  sodium chloride flush (NS) 0.9 % injection 3 mL (3 mLs Intravenous Not Given 04/21/19 1335)  alum & mag hydroxide-simeth (MAALOX/MYLANTA) 200-200-20 MG/5ML suspension 30 mL (has no administration in time range)    And  lidocaine (XYLOCAINE) 2 % viscous mouth solution 15 mL (has no administration in time range)     Initial Impression / Assessment and Plan / ED Course  I have reviewed the triage vital signs and the nursing notes.  Pertinent labs & imaging results  that were available during my care of the patient were reviewed by me and considered in my medical decision making (see chart for details).        Patient seen and examined.  Reviewed records.  Patient seems slightly confused recalling history details at time of exam.  Reviewed results from CT imaging in February and March.  Patient does not member having GI follow-up but may need this for consideration of endoscopy or colonoscopy.  Patient has gallstones seen on previous imaging studies, however her pain is not localized to the right upper quadrant today.  Vital signs reviewed and are as follows: BP (!) 142/76 (BP Location: Left Arm)   Pulse 75   Temp 98.4 F (36.9 C) (Oral)   Resp 20   Ht 5\' 5"  (1.651 m)   Wt 74.7 kg   SpO2 100%   BMI 27.41 kg/m   3:26 PM Patient seen by and discussed previously with Dr. Billy Fischer.   Patient's work-up is reassuring today.  I spoke with the patient's husband by telephone.  He relates that patient called him from work today stating that her abdomen was hurting worse than usual.  Her symptoms today have been intermittent over the past several months and there were no differences or changes in her symptoms today.  He states that she was scheduled to see a gastroenterologist however he had a procedure on his eye that was rescheduled the day prior to her appointment and they had to cancel it.  He has not yet rescheduled.  He is uncertain if patient is taking pantoprazole as prescribed by her doctor a month ago.  Current plan is discharged home with close PCP  follow-up and GI follow-up.  Has been seems willing to reschedule GI appointment.  Will ensure that patient is taking Protonix and another prescription for Carafate is given.  The patient was urged to return to the Emergency Department immediately with worsening of current symptoms, worsening abdominal pain, persistent vomiting, blood noted in stools, fever, or any other concerns. The patient verbalized  understanding.    Final Clinical Impressions(s) / ED Diagnoses   Final diagnoses:  Generalized abdominal pain   Patient with chronic abdominal pain, worse in the left upper quadrant, ongoing over the past several months.  Patient has had several documented ED and PCP visits for similar symptoms.  She has had reassuring CT imaging.  Today the patient's lab work is reassuring.  No UTI seen.  No signs of obstruction on plain films of the abdomen.  Her abdominal exam is reassuring.  Patient does have underlying dementia which makes history difficult.  She also seems to have some stressors at home.  No objective findings of abuse or trauma.  Patient will need to continue following up with her primary care and hopefully be seen by GI for further evaluation of her chronic abdominal symptoms.  No indications for advanced imaging or admission to the hospital today.  ED Discharge Orders         Ordered    sucralfate (CARAFATE) 1 GM/10ML suspension  Every 6 hours PRN     04/21/19 1522           Renne CriglerGeiple, Kerwin Augustus, PA-C 04/21/19 1529    Alvira MondaySchlossman, Erin, MD 04/22/19 1153

## 2019-04-21 NOTE — ED Triage Notes (Signed)
Abdominal pain x 2 months. Dysuria.

## 2019-04-25 NOTE — Progress Notes (Signed)
[facility] [address], [place], [place] [address] [phone]  Date:  04/26/2019   Name:  Tina Fitzpatrick   DOB:  06/28/1951   MRN:  [id number]  PCP:  Darreld Mclean, MD    Chief Complaint: ER follow up (abdominal pain, still havin the pain, feels like knots)   History of Present Illness:  Tina Fitzpatrick is a 68 y.o. very pleasant female patient who presents with the following:  History of dementia, HTN, hyperlipidemia  Following up today from recent ER visit-  Pt seen on 8/7 with concern of abd pain: Patient with chronic abdominal pain, worse in the left upper quadrant, ongoing over the past several months.  Patient has had several documented ED and PCP visits for similar symptoms.  She has had reassuring CT imaging.  Today the patient's lab work is reassuring.  No UTI seen.  No signs of obstruction on plain films of the abdomen.  Her abdominal exam is reassuring.  Patient does have underlying dementia which makes history difficult.  She also seems to have some stressors at home.  No objective findings of abuse or trauma.  Patient will need to continue following up with her primary care and hopefully be seen by GI for further evaluation of her chronic abdominal symptoms.  No indications for advanced imaging or admission to the hospital today  Her lab work on 8/7, including CMP, CBC, lipase was all reassuring Plain films of her abdomen which showed no obstruction or free air GYN eval- she did see Dr Nehemiah Settle in March, she was not able to tolerate endo bx. They planned for an [place]- I don't see this result yet however  She had visit with Dr. Bryan Lemma in [place] she is only made him virtually  At this point she has had the abd pain most days for about 5 months She had to miss a GI appt recently due to her husband having an important eye procedure  Today she continues to have pain which she states is present  basically every day  She is eating normally-present reports her appetite is quite good No vomiting or diarrhea  She does not notice any pattern of post prandial pain but she does have some memory issues She does known known history of gallstones seen on CT scan  Her husband is present today and contributes with history Wt Readings from Last 3 Encounters:  04/26/19 164 lb (74.4 kg)  04/21/19 164 lb 11.2 oz (74.7 kg)  12/21/18 169 lb (76.7 kg)    Patient Active Problem List   Diagnosis Date Noted  . Normochromic normocytic anemia 09/09/2017  . Acute lower UTI 09/09/2017  . Acute encephalopathy 09/08/2017  . 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 Orthostatic hypotension 10/30/2015  . Mild dementia ([place]) 07/29/2015  . Encephalopathy, metabolic 93/26/7124  . Syncope 06/22/2013  . GERD (gastroesophageal reflux disease)   . Essential hypertension   . Hyperlipidemia   . Depression   . DDD (degenerative disc disease), cervical     Past Medical History:  Diagnosis Date  . DDD (degenerative disc disease), cervical   . Dementia ([place])   . Depression   . GERD (gastroesophageal reflux disease)   . Hyperlipidemia   . Hypertension   . Kidney stones     Past Surgical History:  Procedure Laterality Date  . kidney stones  1988  . NECK SURGERY  2008   Plate/Screws    Social History   Tobacco Use  . Smoking status: Former Smoker    Types: Cigarettes  . Smokeless tobacco: Never Used  Substance Use Topics  . Alcohol use: No    Alcohol/week: 0.0 standard drinks  . Drug use: No    Family History  Problem Relation Age of Onset  . Heart attack Mother     No Known Allergies  Medication list has been reviewed and updated.  Current Outpatient Medications on File Prior to Visit  Medication Sig Dispense Refill  . ALPRAZolam (XANAX) 0.25 MG tablet Take 1 tablet (0.25 mg total) by mouth 2 (two) times daily. 180 tablet 1  . aspirin 81 MG tablet  Take 81 mg by mouth daily.    . fenofibrate 160 MG tablet TAKE 1 TABLET BY MOUTH EVERY DAY 90 tablet 1  . gabapentin (NEURONTIN) 100 MG capsule Take 1 capsule (100 mg total) by mouth daily. 90 capsule 3  . lisinopril (ZESTRIL) 5 MG tablet Take 1 tablet (5 mg total) by mouth daily. 90 tablet 1  . metoprolol tartrate (LOPRESSOR) 50 MG tablet TAKE 1 TABLET BY MOUTH TWICE A DAY 180 tablet 1  . pantoprazole (PROTONIX) 40 MG tablet Take 1 tablet (40 mg total) by mouth daily. 90 tablet 3  . sucralfate (CARAFATE) 1 GM/10ML suspension Take 10 mLs (1 g total) by mouth every 6 (six) hours as needed. 420 mL 1   No current facility-administered medications on file prior to visit.     Review of Systems:  As per HPI- otherwise negative.  No fever or chills Physical Examination: Vitals:   04/26/19 1416  BP: 118/72  Pulse: 63  Resp: 16  Temp: (!) 97.5 F (36.4 C)  SpO2: 99%   Vitals:   04/26/19 1416  Weight: 164 lb (74.4 kg)  Height: 5\' 5"  (1.651 m)   Body mass index is 27.29 kg/m. Ideal Body Weight: Weight in (lb) to have BMI = 25: 149.9  GEN: WDWN, NAD, Non-toxic, A & O x 3, slight overweight, looks well HEENT: Atraumatic, Normocephalic. Neck supple. No masses, No LAD. Ears and Nose: No external deformity. CV: RRR, No M/G/R. No JVD. No thrill. No extra heart sounds. PULM: CTA B, no wheezes, crackles, rhonchi. No retractions. No resp. distress. No accessory muscle use. ABD: S, NT, +BS. No rebound. No HSM. Patient notes tenderness over the entire abdomen, and also over bilateral lower ribs EXTR: No c/c/e NEURO Normal gait.  PSYCH: Normally interactive. Conversant. Not depressed or anxious appearing.  Calm demeanor.    Assessment and Plan:   ICD-10-CM   1. Chronic abdominal pain  R10.9 US Abdomen Limited RUQ   G89.29   2. Post-menopausal bleeding  N95.0    Tina Fitzpatrick is here today with abdominal pain, present for several months.  She has undergone 2 CT scans, one in February and one in  March Her symptoms are stable, and she recently was evaluated in the ER.  I feel that a repeat CT scan today would be unlikely to benefit her and expose her to needless radiation.  I explained this to patient and her husband and they are in agreement Would like to get a right upper quadrant ultrasound to evaluate gallbladder, scheduled for this Friday I called gastroenterology, was able to get her an appointment in about 2 weeks.  I will reach out to her GI doc and see if she might be able to be seen sooner We will also  recheck to her gynecologist about follow-up If any changes, worsening, other concerns are let me know Follow-up: No follow-ups on file.  No orders of the defined types were placed in this encounter.  Orders Placed This Encounter  Procedures  . US Abdomen Limited RUQ    @SIGN @    Signed Abbe AmsterdamJessica Montrice Gracey, MD  8/27 at 10:30 am

## 2019-04-26 ENCOUNTER — Other Ambulatory Visit: Payer: Self-pay

## 2019-04-26 ENCOUNTER — Encounter: Payer: Self-pay | Admitting: Family Medicine

## 2019-04-26 ENCOUNTER — Ambulatory Visit (INDEPENDENT_AMBULATORY_CARE_PROVIDER_SITE_OTHER): Payer: Medicare Other | Admitting: Family Medicine

## 2019-04-26 VITALS — BP 118/72 | HR 63 | Temp 97.5°F | Resp 16 | Ht 65.0 in | Wt 164.0 lb

## 2019-04-26 DIAGNOSIS — N950 Postmenopausal bleeding: Secondary | ICD-10-CM | POA: Diagnosis not present

## 2019-04-26 DIAGNOSIS — R109 Unspecified abdominal pain: Secondary | ICD-10-CM | POA: Diagnosis not present

## 2019-04-26 DIAGNOSIS — G8929 Other chronic pain: Secondary | ICD-10-CM | POA: Diagnosis not present

## 2019-04-26 NOTE — Patient Instructions (Addendum)
It was good to see you today-  You have a gastroenterology appt with Dr. Bryan Lemma on 8/27 at 10:30 am here at the med [facility]  I also set you up for an ultrasound of your gallbladder -you have an appt this coming Friday at 11 here at the [facility].  Please do not EAT OR DRINK anything at all after midnight the day before   Please let me know if anything is changing or getting worse, or if you have any fever or vomiting, or if you lose your appetite

## 2019-04-28 ENCOUNTER — Other Ambulatory Visit: Payer: Self-pay

## 2019-04-28 ENCOUNTER — Ambulatory Visit (HOSPITAL_BASED_OUTPATIENT_CLINIC_OR_DEPARTMENT_OTHER)
Admission: RE | Admit: 2019-04-28 | Discharge: 2019-04-28 | Disposition: A | Discharge status: Home / Self Care | Payer: Medicare Other | Source: Ambulatory Visit | Attending: Family Medicine | Admitting: Family Medicine

## 2019-04-28 DIAGNOSIS — G8929 Other chronic pain: Secondary | ICD-10-CM

## 2019-04-28 DIAGNOSIS — K760 Fatty (change of) liver, not elsewhere classified: Secondary | ICD-10-CM | POA: Diagnosis not present

## 2019-04-28 DIAGNOSIS — K8020 Calculus of gallbladder without cholecystitis without obstruction: Secondary | ICD-10-CM | POA: Diagnosis not present

## 2019-04-28 DIAGNOSIS — R109 Unspecified abdominal pain: Secondary | ICD-10-CM | POA: Diagnosis not present

## 2019-04-29 ENCOUNTER — Telehealth: Payer: Self-pay | Admitting: Family Medicine

## 2019-04-29 NOTE — Telephone Encounter (Signed)
Called her husband Tina Fitzpatrick and went over [place] report  Dg Abd 2 Views  Result Date: 04/21/2019 CLINICAL DATA:  Abdominal pain EXAM: ABDOMEN - 2 VIEW COMPARISON:  November 21, 2018 abdominal radiograph; CT abdomen and pelvis December 03, 2018 FINDINGS: Supine and upright images obtained. There is moderate stool throughout the colon. There is no bowel dilatation or air-fluid level to suggest bowel obstruction. No free air. No abnormal calcifications. Lung bases clear. IMPRESSION: Moderate stool in colon. No bowel obstruction or free air. Lung bases clear. Electronically Signed   By: Lowella Grip III M.D.   On: 04/21/2019 14:32   US Abdomen Limited Ruq  Result Date: 04/28/2019 CLINICAL DATA:  Abdominal pain for the past 5 months, worse in the left upper quadrant. EXAM: ULTRASOUND ABDOMEN LIMITED RIGHT UPPER QUADRANT COMPARISON:  Abdomen ultrasound dated 09/16/2009 and abdomen and pelvis CT dated 12/03/2018. FINDINGS: Gallbladder: Adjacent 4 mm and 3 mm gallstones in the gallbladder, also seen on the previous CT. No gallbladder wall thickening or pericholecystic fluid and no sonographic Murphy sign. Common bile duct: Diameter: 2.6 mm Liver: Diffusely echogenic. Corresponding low density on the recent CT. Portal vein is patent on color Doppler imaging with normal direction of blood flow towards the liver. Other: None. IMPRESSION: 1. Hepatic steatosis. 2. Cholelithiasis without evidence of cholecystitis. Electronically Signed   By: Claudie Revering M.D.   On: 04/28/2019 15:51    Gallstones are present, but no apparent cholecystitis Await GI appointment Tacy seems to be stable, her husband reports that she is doing well today I asked him to let me know if any concerns

## 2019-05-03 ENCOUNTER — Telehealth: Payer: Self-pay | Admitting: Family Medicine

## 2019-05-03 NOTE — Telephone Encounter (Signed)
Attempted to call patient to give her u/s appointment and well as appointment after u/s. Husband answered and he was instructed to have the patient give the office a call back to talk about her appointments.

## 2019-05-11 ENCOUNTER — Encounter: Payer: Self-pay | Admitting: Gastroenterology

## 2019-05-11 ENCOUNTER — Other Ambulatory Visit: Payer: Self-pay

## 2019-05-11 ENCOUNTER — Ambulatory Visit: Payer: Medicare Other | Admitting: Gastroenterology

## 2019-05-11 VITALS — BP 120/80 | HR 69 | Temp 97.8°F | Ht 65.0 in | Wt 166.0 lb

## 2019-05-11 DIAGNOSIS — Z1211 Encounter for screening for malignant neoplasm of colon: Secondary | ICD-10-CM | POA: Diagnosis not present

## 2019-05-11 DIAGNOSIS — R1084 Generalized abdominal pain: Secondary | ICD-10-CM | POA: Diagnosis not present

## 2019-05-11 DIAGNOSIS — K8020 Calculus of gallbladder without cholecystitis without obstruction: Secondary | ICD-10-CM | POA: Diagnosis not present

## 2019-05-11 DIAGNOSIS — Z1212 Encounter for screening for malignant neoplasm of rectum: Secondary | ICD-10-CM

## 2019-05-11 MED ORDER — NA SULFATE-K SULFATE-MG SULF 17.5-3.13-1.6 GM/177ML PO SOLN: 1.0000 | Freq: Once | ORAL | 0 refills | Status: AC

## 2019-05-11 NOTE — Progress Notes (Signed)
P  Chief Complaint:    Abdominal pain  GI History: 68 year old female with a history of dementia, hypertension, hyperlipidemia, GERD, with upper abdominal pain since 10/2018.  Pain generalized throughout abdomen, upper > lower. Can occasionally radiate around to the back/flanks.  Pain lasts throughout the day without exacerbating or alleviating factors.  No improvement with trial of Protonix 20 mg.  Evaluation to date as below:  GI evaluation to date: -CT (10/2018): focal thickening of the small bowel in the central/left abdomen, suggesting focal enteritis without obstruction, probable fatty infiltration, otherwise normal.    Treated with Augmentin.  No change. - Labs otherwise unrevealing - Abdominal x-ray (11/2018): Negative/normal.  Treated with Protonix - [facility] on 3/21 with c/o LUQ pain. Evaluation was largely unrevealing, to include repeat CT abd/pelvis (single dependent GB stone w/o cholecystitis or CDL) and the previously noted bowel wall thickening had since resolved. Normal UA, LAEs, lipase, CBC, and negative troponin and d/c-ed to home. - H. pylori negative  HPI:    Patient is a 68 y.o. female presenting to the [facility]astroenterology Clinic for follow-up.  She was initially seen by me in 12/2018 (telemedicine) with upper>lower abdominal pain.  Evaluation to date largely unrevealing as outlined above.  Did report increased anxiety, particularly related to dementia.  H. pylori negative.  Trialed course of Protonix 40 mg p.o. bid x4 weeks, then titrate to 40 mg daily without any change. Trialed Levsin and Carafate prn- no improvement.  They elected to hold off on EGD given COVID-19 related concerns.  Was seen again in the [facility] on 04/21/2019 with abdominal pain.  Evaluation again unrevealing (CBC, CMP, lipase, UA).  Abdominal x-ray with moderate stool, otherwise normal.  She reports normal bowel habits, no straining, hard stools.  No history of clinical constipation.  Was seen  by Dr. Patsy Lageropland, and further evaluation with RUQ US on 8/14 with cholelithiasis without cholecystitis, normal CBD, diffusely echogenic liver c.w hepatic steatosis.  Today she (and her husband, who assists in history) states she continues to have abdominal pain, most days per week. No improvement with trials of Levsin, Carafate and high dose PPI.  Still no hematochezia, weight loss, night sweats, fever, chills, nausea, vomiting.  Pain occurs at random.  Not associated to p.o. intake.  Review of systems:     No chest pain, no SOB, no fevers, no urinary sx   Past Medical History:  Diagnosis Date  . DDD (degenerative disc disease), cervical   . Dementia (H[facility])   . Depression   . GERD (gastroesophageal reflux disease)   . Hyperlipidemia   . Hypertension   . Kidney stones     Patient's surgical history, family medical history, social history, medications and allergies were all reviewed in Epic    Current Outpatient Medications  Medication Sig Dispense Refill  . ALPRAZolam (XANAX) 0.25 MG tablet Take 1 tablet (0.25 mg total) by mouth 2 (two) times daily. 180 tablet 1  . aspirin 81 MG tablet Take 81 mg by mouth daily.    . fenofibrate 160 MG tablet TAKE 1 TABLET BY MOUTH EVERY DAY 90 tablet 1  . gabapentin (NEURONTIN) 100 MG capsule Take 1 capsule (100 mg total) by mouth daily. 90 capsule 3  . lisinopril (ZESTRIL) 5 MG tablet Take 1 tablet (5 mg total) by mouth daily. 90 tablet 1  . metoprolol tartrate (LOPRESSOR) 50 MG tablet TAKE 1 TABLET BY MOUTH TWICE A DAY 180 tablet 1  . pantoprazole (PROTONIX)  40 MG tablet Take 1 tablet (40 mg total) by mouth daily. 90 tablet 3  . sucralfate (CARAFATE) 1 GM/10ML suspension Take 10 mLs (1 g total) by mouth every 6 (six) hours as needed. 420 mL 1   No current facility-administered medications for this visit.     Physical Exam:     BP 120/80   Pulse 69   Temp 97.8 F (36.6 C)   Ht 5\' 5"  (1.651 m) Comment: with shoes  Wt 166 lb (75.3 kg)   BMI  27.62 kg/m   GENERAL:  Pleasant female in NAD PSYCH: : Cooperative, normal affect EENT:  conjunctiva pink, mucous membranes moist, neck supple without masses CARDIAC:  RRR, no murmur heard, no peripheral edema PULM: Normal respiratory effort, lungs CTA bilaterally, no wheezing ABDOMEN:  Nondistended, soft, nontender. No obvious masses, no hepatomegaly,  normal bowel sounds SKIN:  turgor, no lesions seen Musculoskeletal:  Normal muscle tone, normal strength NEURO: Alert and oriented x 3, no focal neurologic deficits   IMPRESSION and PLAN:     1) Abdominal pain: - Has had negative evaluation to date, but continues to have abdominal pain most days per week. -EGD to evaluate for mucosal/luminal etiology with gastric and duodenal biopsies - Has stopped taking Levsin and Carafate - If evaluation unrevealing and ongoing symptoms, may consider low-dose TCA for neuromodulation and treatment of possible component of anxiety.  Would plan to do this in concert with her PCM given underlying dementia  2) CRC screening: - No previous CRC screening. -Schedule colonoscopy for routine, average risk, age-appropriate screening - Can certainly evaluate for mucosal/luminal etiology of abdominal pain at time of colonoscopy  3) Cholelithiasis: -RUQ [place] with cholelithiasis without cholecystitis.  LAE is otherwise normal.  Pain does not tend to be reliably postprandial or biliary in description   The indications, risks, and benefits of EGD and colonoscopy were explained to the patient and her husband in detail. Risks include but are not limited to bleeding, perforation, adverse reaction to medications, and cardiopulmonary compromise. Sequelae include but are not limited to the possibility of surgery, hositalization, and mortality. The patient verbalized understanding and wished to proceed. All questions answered, referred to scheduler and bowel prep ordered. Further recommendations pending results of the exam.          Tina Fitzpatrick ,DO, FACG 05/11/2019, 11:09 AM

## 2019-05-11 NOTE — Patient Instructions (Signed)
If you are age 67 or older, your body mass index should be between 23-30. Your Body mass index is 27.62 kg/m. If this is out of the aforementioned range listed, please consider follow up with your Primary Care Provider.  If you are age 83 or younger, your body mass index should be between 19-25. Your Body mass index is 27.62 kg/m. If this is out of the aformentioned range listed, please consider follow up with your Primary Care Provider.   To help prevent the possible spread of infection to our patients, communities, and staff; we will be implementing the following measures:  As of now we are not allowing any visitors/family members to accompany you to any upcoming appointments with [facility]. If you have any concerns about this please contact our office to discuss prior to the appointment.   We have sent the following medications to your pharmacy for you to pick up at your convenience: [place] have been scheduled for an endoscopy and colonoscopy. Please follow the written instructions given to you at your visit today. Please pick up your prep supplies at the pharmacy within the next 1-3 days. If you use inhalers (even only as needed), please bring them with you on the day of your procedure. Your physician has requested that you go to www.startemmi.com and enter the access code given to you at your visit today. This web site gives a general overview about your procedure. However, you should still follow specific instructions given to you by our office regarding your preparation for the procedure.  It was a pleasure to see you today!  Vito Cirigliano, D.O.

## 2019-05-16 ENCOUNTER — Ambulatory Visit (HOSPITAL_COMMUNITY)
Admission: RE | Admit: 2019-05-16 | Discharge: 2019-05-16 | Disposition: A | Discharge status: Home / Self Care | Payer: Medicare Other | Source: Ambulatory Visit | Attending: Family Medicine | Admitting: Family Medicine

## 2019-05-16 ENCOUNTER — Other Ambulatory Visit: Payer: Self-pay | Admitting: Family Medicine

## 2019-05-16 ENCOUNTER — Other Ambulatory Visit: Payer: Self-pay

## 2019-05-16 DIAGNOSIS — N950 Postmenopausal bleeding: Secondary | ICD-10-CM

## 2019-05-25 ENCOUNTER — Encounter: Payer: Self-pay | Admitting: Gastroenterology

## 2019-05-31 ENCOUNTER — Telehealth: Payer: Self-pay | Admitting: Obstetrics & Gynecology

## 2019-05-31 NOTE — Telephone Encounter (Signed)
Called the patient to confirm of the upcoming appointment. The patient's husband stated she was unavailable. Left a message stating please call our office.

## 2019-06-01 ENCOUNTER — Encounter: Payer: Medicare Other | Admitting: Obstetrics & Gynecology

## 2019-06-01 ENCOUNTER — Other Ambulatory Visit: Payer: Self-pay

## 2019-06-01 NOTE — Progress Notes (Signed)
Called pt and pt's husband informed me that she would need to reschedule her appt because the pt is not feeling well.  [place] office will contact pt to reschedule.    Mel Almond, RN 06/01/19

## 2019-06-02 ENCOUNTER — Other Ambulatory Visit: Payer: Self-pay | Admitting: Family Medicine

## 2019-06-02 DIAGNOSIS — I10 Essential (primary) hypertension: Secondary | ICD-10-CM

## 2019-06-06 ENCOUNTER — Other Ambulatory Visit: Payer: Self-pay | Admitting: Family Medicine

## 2019-06-06 DIAGNOSIS — R1013 Epigastric pain: Secondary | ICD-10-CM

## 2019-06-08 ENCOUNTER — Encounter: Payer: Medicare Other | Admitting: Gastroenterology

## 2019-06-14 ENCOUNTER — Other Ambulatory Visit: Payer: Self-pay | Admitting: Family Medicine

## 2019-06-14 DIAGNOSIS — E785 Hyperlipidemia, unspecified: Secondary | ICD-10-CM

## 2019-06-22 ENCOUNTER — Ambulatory Visit: Payer: Medicare Other | Admitting: Obstetrics & Gynecology

## 2019-06-23 ENCOUNTER — Other Ambulatory Visit: Payer: Self-pay | Admitting: Family Medicine

## 2019-06-23 DIAGNOSIS — I10 Essential (primary) hypertension: Secondary | ICD-10-CM

## 2019-07-06 ENCOUNTER — Encounter: Payer: Medicare Other | Admitting: Gastroenterology

## 2019-07-06 ENCOUNTER — Telehealth: Payer: Self-pay | Admitting: Gastroenterology

## 2019-07-06 NOTE — Telephone Encounter (Signed)
No charge. Thank you for the update.

## 2019-07-06 NOTE — Telephone Encounter (Signed)
Hey Dr. Bryan Lemma- This patient Tina Fitzpatrick called today and cancelled her Endo/Colon procedure today. I spoke with her husband and he stated that she is sick and would not be coming in for her appointment. Would you like to charge the patient?

## 2019-07-31 ENCOUNTER — Other Ambulatory Visit: Payer: Self-pay | Admitting: Family Medicine

## 2019-07-31 DIAGNOSIS — F411 Generalized anxiety disorder: Secondary | ICD-10-CM

## 2019-07-31 NOTE — Telephone Encounter (Signed)
Requesting:Alprazolam Contract:none DIX:BOER Last Visit:04/26/2019 Next Visit:none Last Refill:12/27/2018  Please Advise

## 2019-09-24 ENCOUNTER — Other Ambulatory Visit: Payer: Self-pay

## 2019-09-24 ENCOUNTER — Encounter (HOSPITAL_BASED_OUTPATIENT_CLINIC_OR_DEPARTMENT_OTHER): Payer: Self-pay

## 2019-09-24 ENCOUNTER — Emergency Department (HOSPITAL_BASED_OUTPATIENT_CLINIC_OR_DEPARTMENT_OTHER): Payer: Medicare Other

## 2019-09-24 ENCOUNTER — Emergency Department (HOSPITAL_BASED_OUTPATIENT_CLINIC_OR_DEPARTMENT_OTHER)
Admission: EM | Admit: 2019-09-24 | Discharge: 2019-09-24 | Disposition: A | Discharge status: Home / Self Care | Payer: Medicare Other | Attending: Emergency Medicine | Admitting: Emergency Medicine

## 2019-09-24 DIAGNOSIS — Z79899 Other long term (current) drug therapy: Secondary | ICD-10-CM | POA: Diagnosis not present

## 2019-09-24 DIAGNOSIS — Z8719 Personal history of other diseases of the digestive system: Secondary | ICD-10-CM | POA: Diagnosis not present

## 2019-09-24 DIAGNOSIS — I10 Essential (primary) hypertension: Secondary | ICD-10-CM | POA: Diagnosis not present

## 2019-09-24 DIAGNOSIS — R079 Chest pain, unspecified: Secondary | ICD-10-CM | POA: Diagnosis not present

## 2019-09-24 DIAGNOSIS — Z7982 Long term (current) use of aspirin: Secondary | ICD-10-CM | POA: Insufficient documentation

## 2019-09-24 DIAGNOSIS — F0390 Unspecified dementia without behavioral disturbance: Secondary | ICD-10-CM | POA: Insufficient documentation

## 2019-09-24 DIAGNOSIS — Z87891 Personal history of nicotine dependence: Secondary | ICD-10-CM | POA: Insufficient documentation

## 2019-09-24 DIAGNOSIS — R072 Precordial pain: Secondary | ICD-10-CM | POA: Insufficient documentation

## 2019-09-24 DIAGNOSIS — R0789 Other chest pain: Secondary | ICD-10-CM | POA: Diagnosis present

## 2019-09-24 LAB — CBC
HCT: 39.1 % (ref 36.0–46.0)
Hemoglobin: 13.3 g/dL (ref 12.0–15.0)
MCH: 30.5 pg (ref 26.0–34.0)
MCHC: 34 g/dL (ref 30.0–36.0)
MCV: 89.7 fL (ref 80.0–100.0)
Platelets: 224 10*3/uL (ref 150–400)
RBC: 4.36 MIL/uL (ref 3.87–5.11)
RDW: 12.4 % (ref 11.5–15.5)
WBC: 7.4 10*3/uL (ref 4.0–10.5)
nRBC: 0 % (ref 0.0–0.2)

## 2019-09-24 LAB — COMPREHENSIVE METABOLIC PANEL
ALT: 18 U/L (ref 0–44)
AST: 21 U/L (ref 15–41)
Albumin: 4.6 g/dL (ref 3.5–5.0)
Alkaline Phosphatase: 46 U/L (ref 38–126)
Anion gap: 11 (ref 5–15)
BUN: 13 mg/dL (ref 8–23)
CO2: 25 mmol/L (ref 22–32)
Calcium: 10.1 mg/dL (ref 8.9–10.3)
Chloride: 104 mmol/L (ref 98–111)
Creatinine, Ser: 0.91 mg/dL (ref 0.44–1.00)
GFR calc Af Amer: >60 mL/min (ref >60)
GFR calc non Af Amer: >60 mL/min (ref >60)
Glucose, Bld: 110 mg/dL — ABNORMAL HIGH (ref 70–99)
Potassium: 3.5 mmol/L (ref 3.5–5.1)
Sodium: 140 mmol/L (ref 135–145)
Total Bilirubin: 0.3 mg/dL (ref 0.3–1.2)
Total Protein: 7.3 g/dL (ref 6.5–8.1)

## 2019-09-24 LAB — TROPONIN I (HIGH SENSITIVITY): Troponin I (High Sensitivity): <2 ng/L (ref <18)

## 2019-09-24 LAB — LIPASE, BLOOD: Lipase: 44 U/L (ref 11–51)

## 2019-09-24 MED ORDER — ACETAMINOPHEN 500 MG PO TABS
1000.0000 mg | ORAL_TABLET | Freq: Once | ORAL | Status: AC
  Administered 2019-09-24: 1000 mg via ORAL
  Filled 2019-09-24: qty 2

## 2019-09-24 NOTE — ED Triage Notes (Signed)
Pt arrived from home with c/o recurrent left rib pain, unable to state when this pain started reports it has been ongoing for years.

## 2019-09-24 NOTE — ED Provider Notes (Signed)
[facility] Provider Note   CSN: [id number] Arrival date & time: 09/24/19  1509     History Chief Complaint  Patient presents with  . Chest Pain    Tina Fitzpatrick is a 69 y.o. female.  Patient c/o bilateral lower chest pain for past  2-3 days. Symptoms occur at rest, sharp, non radiating, without specific exacerbating or alleviating factors. No associated sob, nv or diaphoresis. No palpitations. Pain is not pleuritic. No exertional cp or discomfort. No unusual doe or fatigue. No cough or uri symptoms. No fever or chills. No abd pain or nv. No hx cad, or fam hx premature cad. No leg pain or swelling. No hx dvt or pe.   The history is provided by the patient.  Chest Pain Associated symptoms: no abdominal pain, no back pain, no cough, no fever, no headache, no shortness of breath and no vomiting        Past Medical History:  Diagnosis Date  . DDD (degenerative disc disease), cervical   . Dementia ([place])   . Depression   . GERD (gastroesophageal reflux disease)   . Hyperlipidemia   . Hypertension   . Kidney stones     Patient Active Problem List   Diagnosis Date Noted  . Normochromic normocytic anemia 09/09/2017  . Acute lower UTI 09/09/2017  . Acute encephalopathy 09/08/2017  . 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 Orthostatic hypotension 10/30/2015  . Mild dementia ([place]) 07/29/2015  . Encephalopathy, metabolic 85/46/2703  . Syncope 06/22/2013  . GERD (gastroesophageal reflux disease)   . Essential hypertension   . Hyperlipidemia   . Depression   . DDD (degenerative disc disease), cervical     Past Surgical History:  Procedure Laterality Date  . kidney stones  1988  . NECK SURGERY  2008   Plate/Screws     OB History    Gravida  2   Para  2   Term  2   Preterm      AB      Living  2     SAB      TAB      Ectopic      Multiple      Live Births  2           Family  History  Problem Relation Age of Onset  . Heart attack Mother   . Colon cancer Neg Hx   . Esophageal cancer Neg Hx     Social History   Tobacco Use  . Smoking status: Former Smoker    Types: Cigarettes  . Smokeless tobacco: Never Used  Substance Use Topics  . Alcohol use: No    Alcohol/week: 0.0 standard drinks  . Drug use: No    Home Medications Prior to Admission medications   Medication Sig Start Date End Date Taking? Authorizing Provider  ALPRAZolam (XANAX) 0.25 MG tablet TAKE 1 TABLET (0.25 MG TOTAL) BY MOUTH 2 (TWO) TIMES DAILY. 07/31/19   Copland, Gay Filler, MD  aspirin 81 MG tablet Take 81 mg by mouth daily.    [provider]  fenofibrate 160 MG tablet TAKE 1 TABLET BY MOUTH EVERY DAY 06/14/19   Copland, Gay Filler, MD  gabapentin (NEURONTIN) 100 MG capsule Take 1 capsule (100 mg total) by mouth daily. 12/21/18   Blanchie Dessert, MD  lisinopril (ZESTRIL) 5 MG tablet TAKE 1 TABLET BY MOUTH EVERY DAY 06/23/19   Copland, Gay Filler, MD  metoprolol  tartrate (LOPRESSOR) 50 MG tablet TAKE 1 TABLET BY MOUTH TWICE A DAY 06/02/19   Copland, Gwenlyn Found, MD  pantoprazole (PROTONIX) 20 MG tablet TAKE 1 TABLET BY MOUTH EVERY DAY 06/07/19   Copland, Gwenlyn Found, MD  pantoprazole (PROTONIX) 40 MG tablet Take 1 tablet (40 mg total) by mouth daily. 01/11/19   Cirigliano, Vito V, DO  sucralfate (CARAFATE) 1 GM/10ML suspension Take 10 mLs (1 g total) by mouth every 6 (six) hours as needed. 04/21/19   Renne Crigler, PA-C    Allergies    Patient has no known allergies.  Review of Systems   Review of Systems  Constitutional: Negative for fever.  HENT: Negative for sore throat.   Eyes: Negative for redness.  Respiratory: Negative for cough and shortness of breath.   Cardiovascular: Positive for chest pain.  Gastrointestinal: Negative for abdominal pain, diarrhea and vomiting.  Genitourinary: Negative for flank pain.  Musculoskeletal: Negative for back pain and neck pain.  Skin: Negative  for rash.  Neurological: Negative for headaches.  Hematological: Does not bruise/bleed easily.  Psychiatric/Behavioral: Negative for agitation.    Physical Exam Updated Vital Signs BP 139/71 (BP Location: Left Arm)   Pulse 74   Temp 98.4 F (36.9 C) (Oral)   Resp 18   Ht 1.676 m (5\' 6" )   Wt 75 kg   SpO2 100%   BMI 26.69 kg/m   Physical Exam Vitals and nursing note reviewed.  Constitutional:      Appearance: Normal appearance. She is well-developed.  HENT:     Head: Atraumatic.     Nose: Nose normal.     Mouth/Throat:     Mouth: Mucous membranes are moist.  Eyes:     General: No scleral icterus.    Conjunctiva/sclera: Conjunctivae normal.     Pupils: Pupils are equal, round, and reactive to light.  Neck:     Trachea: No tracheal deviation.  Cardiovascular:     Rate and Rhythm: Normal rate and regular rhythm.     Pulses: Normal pulses.     Heart sounds: Normal heart sounds. No murmur. No friction rub. No gallop.   Pulmonary:     Effort: Pulmonary effort is normal. No respiratory distress.     Breath sounds: Normal breath sounds.  Abdominal:     General: Bowel sounds are normal. There is no distension.     Palpations: Abdomen is soft.     Tenderness: There is no abdominal tenderness. There is no guarding.  Genitourinary:    Comments: No cva tenderness.  Musculoskeletal:        General: No swelling or tenderness.     Cervical back: Normal range of motion and neck supple. No rigidity. No muscular tenderness.     Right lower leg: No edema.     Left lower leg: No edema.  Skin:    General: Skin is warm and dry.     Findings: No rash.  Neurological:     Mental Status: She is alert.     Comments: Alert, speech normal.   Psychiatric:        Mood and Affect: Mood normal.     ED Results / Procedures / Treatments   Labs (all labs ordered are listed, but only abnormal results are displayed) Results for orders placed or performed during the hospital encounter of  09/24/19  CBC  Result Value Ref Range   WBC 7.4 4.0 - 10.5 K/uL   RBC 4.36 3.87 - 5.11 MIL/uL  Hemoglobin 13.3 12.0 - 15.0 g/dL   HCT 41.9 62.2 - 29.7 %   MCV 89.7 80.0 - 100.0 fL   MCH 30.5 26.0 - 34.0 pg   MCHC 34.0 30.0 - 36.0 g/dL   RDW 98.9 21.1 - 94.1 %   Platelets 224 150 - 400 K/uL   nRBC 0.0 0.0 - 0.2 %  CMET  Result Value Ref Range   Sodium 140 135 - 145 mmol/L   Potassium 3.5 3.5 - 5.1 mmol/L   Chloride 104 98 - 111 mmol/L   CO2 25 22 - 32 mmol/L   Glucose, Bld 110 (H) 70 - 99 mg/dL   BUN 13 8 - 23 mg/dL   Creatinine, Ser 7.40 0.44 - 1.00 mg/dL   Calcium 81.4 8.9 - 48.1 mg/dL   Total Protein 7.3 6.5 - 8.1 g/dL   Albumin 4.6 3.5 - 5.0 g/dL   AST 21 15 - 41 U/L   ALT 18 0 - 44 U/L   Alkaline Phosphatase 46 38 - 126 U/L   Total Bilirubin 0.3 0.3 - 1.2 mg/dL   GFR calc non Af Amer >60 >60 mL/min   GFR calc Af Amer >60 >60 mL/min   Anion gap 11 5 - 15  Lipase  Result Value Ref Range   Lipase 44 11 - 51 U/L  Troponin I (High Sensitivity)  Result Value Ref Range   Troponin I (High Sensitivity) <2 <18 ng/L   XR Chest PA/L  Result Date: 09/24/2019 CLINICAL DATA:  Pain EXAM: CHEST - 2 VIEW COMPARISON:  09/08/2017 FINDINGS: Mild hyperinflation. Heart and mediastinal contours are within normal limits. No focal opacities or effusions. No acute bony abnormality. IMPRESSION: No active cardiopulmonary disease. Electronically Signed   By: Charlett Nose M.D.   On: 09/24/2019 16:08    EKG EKG Interpretation  Date/Time:  Sunday September 24 2019 15:44:58 EST Ventricular Rate:  82 PR Interval:    QRS Duration: 97 QT Interval:  375 QTC Calculation: 438 R Axis:   -21 Text Interpretation: Sinus rhythm Low voltage, precordial leads Baseline wander in lead(s) V6 No significant change since last tracing Confirmed by Cathren Laine ([id number]) on 09/24/2019 5:11:34 PM   Radiology XR Chest PA/L  Result Date: 09/24/2019 CLINICAL DATA:  Pain EXAM: CHEST - 2 VIEW COMPARISON:   09/08/2017 FINDINGS: Mild hyperinflation. Heart and mediastinal contours are within normal limits. No focal opacities or effusions. No acute bony abnormality. IMPRESSION: No active cardiopulmonary disease. Electronically Signed   By: Charlett Nose M.D.   On: 09/24/2019 16:08    Procedures Procedures (including critical care time)  Medications Ordered in ED Medications - No data to display  ED Course  I have reviewed the triage vital signs and the nursing notes.  Pertinent labs & imaging results that were available during my care of the patient were reviewed by me and considered in my medical decision making (see chart for details).    MDM Rules/Calculators/A&P                      Iv ns. Labs sent. Cxr.   Reviewed nursing notes and prior charts for additional history.   Acetaminophen po. Po fluids.  CXR reviewed/interpreted by me - no pna.   Labs reviewed/interpreted by me - chem normal. Trop normal (after 2-3 days of symptoms).   Symptoms felt not c/w acs, and possibly msk in nature. Pt also w noted hx gallstones, and ? Whether possibly related. abd is soft  nt. Pt afebrile. Vitals normal. Pulse ox 100%.   Patient currently appears stable for d/c.   Rec pcp f/u.  Return precautions provided.        Final Clinical Impression(s) / ED Diagnoses Final diagnoses:  None    Rx / DC Orders ED Discharge Orders    None       Cathren Laine, MD 09/24/19 1713

## 2019-09-24 NOTE — ED Notes (Signed)
Patient verbalizes understanding of discharge instructions. Opportunity for questioning and answers were provided. Armband removed by staff, pt discharged from ED ambulatory.   

## 2019-09-24 NOTE — Discharge Instructions (Addendum)
It was our pleasure to provide your ER care today - we hope that you feel better.  Your lab tests look good/normal, as do your chest xray and ecg.   Follow up with primary care doctor in the next 1-2 weeks.   Return to ER if worse, new symptoms, recurrent or persistent chest pain, increased trouble breathing, high fevers, new or severe abdominal pain, or other concern.

## 2019-10-09 ENCOUNTER — Encounter (HOSPITAL_BASED_OUTPATIENT_CLINIC_OR_DEPARTMENT_OTHER): Payer: Self-pay | Admitting: *Deleted

## 2019-10-09 ENCOUNTER — Other Ambulatory Visit: Payer: Self-pay

## 2019-10-09 ENCOUNTER — Emergency Department (HOSPITAL_BASED_OUTPATIENT_CLINIC_OR_DEPARTMENT_OTHER): Payer: Medicare Other

## 2019-10-09 ENCOUNTER — Emergency Department (HOSPITAL_BASED_OUTPATIENT_CLINIC_OR_DEPARTMENT_OTHER)
Admission: EM | Admit: 2019-10-09 | Discharge: 2019-10-09 | Disposition: A | Discharge status: Home / Self Care | Payer: Medicare Other | Attending: Emergency Medicine | Admitting: Emergency Medicine

## 2019-10-09 DIAGNOSIS — Z87891 Personal history of nicotine dependence: Secondary | ICD-10-CM | POA: Diagnosis not present

## 2019-10-09 DIAGNOSIS — G309 Alzheimer's disease, unspecified: Secondary | ICD-10-CM | POA: Insufficient documentation

## 2019-10-09 DIAGNOSIS — I10 Essential (primary) hypertension: Secondary | ICD-10-CM | POA: Insufficient documentation

## 2019-10-09 DIAGNOSIS — F0280 Dementia in other diseases classified elsewhere without behavioral disturbance: Secondary | ICD-10-CM | POA: Diagnosis not present

## 2019-10-09 DIAGNOSIS — Z79899 Other long term (current) drug therapy: Secondary | ICD-10-CM | POA: Diagnosis not present

## 2019-10-09 DIAGNOSIS — Z7982 Long term (current) use of aspirin: Secondary | ICD-10-CM | POA: Insufficient documentation

## 2019-10-09 DIAGNOSIS — E785 Hyperlipidemia, unspecified: Secondary | ICD-10-CM | POA: Insufficient documentation

## 2019-10-09 DIAGNOSIS — R0789 Other chest pain: Secondary | ICD-10-CM | POA: Diagnosis not present

## 2019-10-09 DIAGNOSIS — R079 Chest pain, unspecified: Secondary | ICD-10-CM | POA: Diagnosis not present

## 2019-10-09 LAB — COMPREHENSIVE METABOLIC PANEL
ALT: 18 U/L (ref 0–44)
AST: 19 U/L (ref 15–41)
Albumin: 4.2 g/dL (ref 3.5–5.0)
Alkaline Phosphatase: 43 U/L (ref 38–126)
Anion gap: 7 (ref 5–15)
BUN: 14 mg/dL (ref 8–23)
CO2: 25 mmol/L (ref 22–32)
Calcium: 10 mg/dL (ref 8.9–10.3)
Chloride: 108 mmol/L (ref 98–111)
Creatinine, Ser: 0.92 mg/dL (ref 0.44–1.00)
GFR calc Af Amer: >60 mL/min (ref >60)
GFR calc non Af Amer: >60 mL/min (ref >60)
Glucose, Bld: 127 mg/dL — ABNORMAL HIGH (ref 70–99)
Potassium: 3.6 mmol/L (ref 3.5–5.1)
Sodium: 140 mmol/L (ref 135–145)
Total Bilirubin: 0.5 mg/dL (ref 0.3–1.2)
Total Protein: 7.2 g/dL (ref 6.5–8.1)

## 2019-10-09 LAB — CBC WITH DIFFERENTIAL/PLATELET
Abs Immature Granulocytes: 0.01 10*3/uL (ref 0.00–0.07)
Basophils Absolute: 0.1 10*3/uL (ref 0.0–0.1)
Basophils Relative: 1 %
Eosinophils Absolute: 0.2 10*3/uL (ref 0.0–0.5)
Eosinophils Relative: 2 %
HCT: 39.4 % (ref 36.0–46.0)
Hemoglobin: 13.1 g/dL (ref 12.0–15.0)
Immature Granulocytes: 0 %
Lymphocytes Relative: 28 %
Lymphs Abs: 1.9 10*3/uL (ref 0.7–4.0)
MCH: 30.3 pg (ref 26.0–34.0)
MCHC: 33.2 g/dL (ref 30.0–36.0)
MCV: 91 fL (ref 80.0–100.0)
Monocytes Absolute: 0.4 10*3/uL (ref 0.1–1.0)
Monocytes Relative: 6 %
Neutro Abs: 4.3 10*3/uL (ref 1.7–7.7)
Neutrophils Relative %: 63 %
Platelets: 219 10*3/uL (ref 150–400)
RBC: 4.33 MIL/uL (ref 3.87–5.11)
RDW: 12.1 % (ref 11.5–15.5)
WBC: 6.8 10*3/uL (ref 4.0–10.5)
nRBC: 0 % (ref 0.0–0.2)

## 2019-10-09 LAB — LIPASE, BLOOD: Lipase: 47 U/L (ref 11–51)

## 2019-10-09 LAB — TROPONIN I (HIGH SENSITIVITY): Troponin I (High Sensitivity): <2 ng/L (ref <18)

## 2019-10-09 MED ORDER — ACETAMINOPHEN 325 MG PO TABS
650.0000 mg | ORAL_TABLET | Freq: Once | ORAL | Status: AC
  Administered 2019-10-09: 650 mg via ORAL
  Filled 2019-10-09: qty 2

## 2019-10-09 NOTE — ED Provider Notes (Signed)
[facility] EMERGENCY DEPARTMENT Provider Note   CSN: [id number] Arrival date & time: 10/09/19  1705     History Chief Complaint  Patient presents with  . Chest Pain    Tina Fitzpatrick is a 69 y.o. female with a past medical history significant for dementia, depression, GERD, hyperlipidemia, and hypertension who presents to the ED due to bilateral chest pain located under her breasts that has been going on for "quite a while".  Patient continually states it feels like "rocks" underneath both of her breasts.  She notes her left breast hurts more than her right.  Patient states the pain is intermittent and non-radiating in nature. Denies association with exertion.  Patient denies family history of early CAD.  Patient denies tobacco, alcohol, and drug usage. No associative symptoms of nausea, vomiting, shortness of breath, and numbness/tingling.  Chart reviewed.  Patient was seen on 09/24/2019 for the same complaint with reassuring work-up.  Patient denies history of blood clots, recent immobilizations, recent surgeries, history of cancer, and hemoptysis.  Patient denies lower extremity pain and edema. No cough or URI symptoms. No fever or chills.    Level 5 caveat due to history of dementia. Past Medical History:  Diagnosis Date  . DDD (degenerative disc disease), cervical   . Dementia (HCC)   . Depression   . GERD (gastroesophageal reflux disease)   . Hyperlipidemia   . Hypertension   . Kidney stones     Patient Active Problem List   Diagnosis Date Noted  . Normochromic normocytic anemia 09/09/2017  . Acute lower UTI 09/09/2017  . Acute encephalopathy 09/08/2017  . 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 Orthostatic hypotension 10/30/2015  . Mild dementia (HCC) 07/29/2015  . Encephalopathy, metabolic 06/24/2015  . Syncope 06/22/2013  . GERD (gastroesophageal reflux disease)   . Essential hypertension   . Hyperlipidemia   . Depression     . DDD (degenerative disc disease), cervical     Past Surgical History:  Procedure Laterality Date  . kidney stones  1988  . NECK SURGERY  2008   Plate/Screws     OB History    Gravida  2   Para  2   Term  2   Preterm      AB      Living  2     SAB      TAB      Ectopic      Multiple      Live Births  2           Family History  Problem Relation Age of Onset  . Heart attack Mother   . Colon cancer Neg Hx   . Esophageal cancer Neg Hx     Social History   Tobacco Use  . Smoking status: Former Smoker    Types: Cigarettes  . Smokeless tobacco: Never Used  Substance Use Topics  . Alcohol use: No    Alcohol/week: 0.0 standard drinks  . Drug use: No    Home Medications Prior to Admission medications   Medication Sig Start Date End Date Taking? Authorizing Provider  ALPRAZolam (XANAX) 0.25 MG tablet TAKE 1 TABLET (0.25 MG TOTAL) BY MOUTH 2 (TWO) TIMES DAILY. 07/31/19   Copland, Gwenlyn Found, MD  aspirin 81 MG tablet Take 81 mg by mouth daily.    [provider]  fenofibrate 160 MG tablet TAKE 1 TABLET BY MOUTH EVERY DAY 06/14/19   Copland, Gwenlyn Found,  MD  gabapentin (NEURONTIN) 100 MG capsule Take 1 capsule (100 mg total) by mouth daily. 12/21/18   Blanchie Dessert, MD  lisinopril (ZESTRIL) 5 MG tablet TAKE 1 TABLET BY MOUTH EVERY DAY 06/23/19   Copland, Gay Filler, MD  metoprolol tartrate (LOPRESSOR) 50 MG tablet TAKE 1 TABLET BY MOUTH TWICE A DAY 06/02/19   Copland, Gay Filler, MD  pantoprazole (PROTONIX) 20 MG tablet TAKE 1 TABLET BY MOUTH EVERY DAY 06/07/19   Copland, Gay Filler, MD  pantoprazole (PROTONIX) 40 MG tablet Take 1 tablet (40 mg total) by mouth daily. 01/11/19   Cirigliano, Vito V, DO  sucralfate (CARAFATE) 1 GM/10ML suspension Take 10 mLs (1 g total) by mouth every 6 (six) hours as needed. 04/21/19   Carlisle Cater, PA-C    Allergies    Patient has no known allergies.  Review of Systems   Review of Systems  Unable to perform ROS:  Dementia    Physical Exam Updated Vital Signs BP (!) 144/81   Pulse 87   Temp 97.8 F (36.6 C) (Oral)   Resp 16   Ht 5\' 3"  (1.6 m)   Wt 75 kg   SpO2 99%   BMI 29.29 kg/m   Physical Exam Vitals and nursing note reviewed.  Constitutional:      General: She is not in acute distress.    Appearance: She is not ill-appearing.  HENT:     Head: Normocephalic.  Eyes:     Pupils: Pupils are equal, round, and reactive to light.  Cardiovascular:     Rate and Rhythm: Normal rate and regular rhythm.     Pulses: Normal pulses.     Heart sounds: Normal heart sounds. No murmur. No friction rub. No gallop.   Pulmonary:     Effort: Pulmonary effort is normal.     Breath sounds: Normal breath sounds.  Chest:     Comments: Tenderness to palpation under bilateral breast.  No overlying rash.  No crepitus or deformity. Abdominal:     General: Abdomen is flat. Bowel sounds are normal. There is no distension.     Palpations: Abdomen is soft.     Comments: Diffuse tenderness, most significant in the epigastric region.  Musculoskeletal:     Cervical back: Neck supple.     Comments: To move all 4 extremities without difficulty.  Skin:    General: Skin is warm and dry.  Neurological:     General: No focal deficit present.     Mental Status: She is alert.     ED Results / Procedures / Treatments   Labs (all labs ordered are listed, but only abnormal results are displayed) Labs Reviewed  COMPREHENSIVE METABOLIC PANEL - Abnormal; Notable for the following components:      Result Value   Glucose, Bld 127 (*)    All other components within normal limits  CBC WITH DIFFERENTIAL/PLATELET  LIPASE, BLOOD  TROPONIN I (HIGH SENSITIVITY)    EKG EKG Interpretation  Date/Time:  Monday October 09 2019 17:10:30 EST Ventricular Rate:  94 PR Interval:  166 QRS Duration: 92 QT Interval:  374 QTC Calculation: 467 R Axis:   -2 Text Interpretation: Normal sinus rhythm No STEMI Confirmed by Octaviano Glow [phone]) on 10/09/2019 5:20:44 PM   Radiology DG Chest 2 View  Result Date: 10/09/2019 CLINICAL DATA:  Pain under the left breast EXAM: CHEST - 2 VIEW COMPARISON:  09/24/2019 FINDINGS: Surgical hardware in the cervical spine. No focal consolidation or effusion. Normal heart  size. No pneumothorax. IMPRESSION: No active cardiopulmonary disease. Electronically Signed   By: Jasmine Pang M.D.   On: 10/09/2019 18:46    Procedures Procedures (including critical care time)  Medications Ordered in ED Medications  acetaminophen (TYLENOL) tablet 650 mg (650 mg Oral Given 10/09/19 1801)    ED Course  I have reviewed the triage vital signs and the nursing notes.  Pertinent labs & imaging results that were available during my care of the patient were reviewed by me and considered in my medical decision making (see chart for details).  Clinical Course as of Oct 09 9  Mon Oct 09, 2019  1918 Attempted to call husband on number provided with no answer   [CA]  1956 Attempted to call husband again with no answer   [CA]  2029 Spoke to patient's husband who states that she is at baseline and has had worsening dementia over the past few years. Patient's husband has been taking care of her. She is being followed by Dr. Dallas Schimke.   [CA]    Clinical Course User Index [CA] Mannie Stabile, PA-C   MDM Rules/Calculators/A&P                     69 year old female presents to the ED due to chest pain located under her bilateral breasts.  Patient states this has been going on for some time.  Patient with a history of dementia.  Chart reviewed.  Patient was seen in the ED on 09/24/2019 for the same complaint with reassuring work-up.  Stable vitals.  Patient in no acute distress and nonill appearing.  Tenderness palpation under both breasts with no deformity or crepitus.  Abdomen soft, nondistended with generalized abdominal tenderness most significant in the epigastric region.  Low suspicion for  PE/DVT.  Will obtain routine labs, chest x-ray, and EKG.  CBC unremarkable with no leukocytosis.  Lipase normal at 47.  Doubt pancreatitis.  CMP reassuring with only mild hyperglycemia at 127, but otherwise unremarkable.  Chest x-ray personally reviewed which is negative for signs of pneumonia, pneumothorax, widened mediastinum, or visible rib fractures.  EKG personally reviewed which demonstrates sinus rhythm with no signs of ischemia. Troponin normal. Doubt ACS. Chest pain sounds atypical in nature and appears to be more MSK related given the reproducible nature of pain. Spoke to patient's husband on the phone and updated him on results. Advised patient follow-up with PCP for further evaluation. Patient instructed to take over the counter ibuprofen or tylenol as needed for pain. Strict ED precautions discussed with patient. Patient states understanding and agrees to plan. Patient discharged home in no acute distress and stable vitals  Discussed case with Dr. Renaye Rakers who evaluated patient at bedside and agrees with assessment and plan.  Final Clinical Impression(s) / ED Diagnoses Final diagnoses:  Chest wall pain    Rx / DC Orders ED Discharge Orders    None       Jesusita Oka 10/10/19 0012    Terald Sleeper, MD 10/10/19 1139

## 2019-10-09 NOTE — ED Triage Notes (Signed)
Pain under her left breast for several years. Pain comes and goes. She is crying at triage because she is scared.

## 2019-10-09 NOTE — Discharge Instructions (Signed)
As discussed, all of your labs, chest x-ray, and EKG were normal. You may take over the counter ibuprofen or tylenol as needed for pain. Follow-up with PCP if symptoms do not improve within the next week. Return to the ER for new or worsening symptoms.

## 2019-11-24 ENCOUNTER — Other Ambulatory Visit: Payer: Self-pay | Admitting: Family Medicine

## 2019-11-24 DIAGNOSIS — I10 Essential (primary) hypertension: Secondary | ICD-10-CM

## 2019-11-24 NOTE — Telephone Encounter (Signed)
Metoprolol refill sent to pharmacy. Mailed letter to pt to call and schedule OV.

## 2019-12-03 ENCOUNTER — Other Ambulatory Visit: Payer: Self-pay | Admitting: Family Medicine

## 2019-12-03 DIAGNOSIS — E785 Hyperlipidemia, unspecified: Secondary | ICD-10-CM

## 2019-12-05 ENCOUNTER — Other Ambulatory Visit: Payer: Self-pay

## 2019-12-05 NOTE — Patient Instructions (Addendum)
It was good to see you again today We will try Bentyl as needed for your abdominal pain- you can take it up to 4x a day just as needed.  Let me know what you think about this medication Otherwise, the next step in your evaluation would be the colonoscopy please set this up is desired I am also glad to order a mammogram and bone density for you if you like Please consider getting your covid 19 vaccine and also shingles vaccine at your drug store Please see me in about 6 months for labs

## 2019-12-05 NOTE — Progress Notes (Signed)
[facility] [place], [place],  [address] [phone]  Date:  12/06/2019   Name:  Tina Fitzpatrick   DOB:  1950/11/08   MRN:  [id number]  PCP:  Darreld Mclean, MD    Chief Complaint: Hypertension, Hyperlipidemia, and Dementia   History of Present Illness:  Tina Fitzpatrick is a 69 y.o. very pleasant female patient who presents with the following:  Patient here today for a follow-up visit History of mild dementia, hypertension, hyperlipidemia Last seen by myself in August 2020  She was seen in the ER twice in January with chest pain Last year she had complained of chronic abdominal pain, she consulted with gastroenterology They had planned to do a colonoscopy, but this was canceled due to patient illness in October They are thinking about rescheduling this but have not decided to do so as of yet  Patient is no longer able to be at home alone, her husband started taking her to work with him which is working out well Her dementia does inform her healthcare; she tends to be afraid and upset about many procedures so these are not being pushed  Offer mammogram- decline at this time  Offer bone density scan- decline at this time  Labs done in January, including CMP and CBC-reassuring  She is not having any chest pain Pt notes that "I just want to be fixed so I don't hurt any more"  Her husband states she has not complained about pain much recently.  Today she states she has intermittent pain in her abdomen, she is not able to determine any particular pattern or cause of the pain She is eating normally and maintaining her weight Wt Readings from Last 3 Encounters:  12/06/19 169 lb (76.7 kg)  10/09/19 165 lb 5.5 oz (75 kg)  09/24/19 165 lb 5.5 oz (75 kg)   No significant cough   Patient Active Problem List   Diagnosis Date Noted  . Normochromic normocytic anemia 09/09/2017  . Acute encephalopathy  09/08/2017  . 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 Orthostatic hypotension 10/30/2015  . Mild dementia ([place]) 07/29/2015  . Encephalopathy, metabolic 87/56/4332  . Syncope 06/22/2013  . GERD (gastroesophageal reflux disease)   . Essential hypertension   . Hyperlipidemia   . Depression   . DDD (degenerative disc disease), cervical     Past Medical History:  Diagnosis Date  . DDD (degenerative disc disease), cervical   . Dementia ([place])   . Depression   . GERD (gastroesophageal reflux disease)   . Hyperlipidemia   . Hypertension   . Kidney stones     Past Surgical History:  Procedure Laterality Date  . kidney stones  1988  . NECK SURGERY  2008   Plate/Screws    Social History   Tobacco Use  . Smoking status: Former Smoker    Types: Cigarettes  . Smokeless tobacco: Never Used  Substance Use Topics  . Alcohol use: No    Alcohol/week: 0.0 standard drinks  . Drug use: No    Family History  Problem Relation Age of Onset  . Heart attack Mother   . Colon cancer Neg Hx   . Esophageal cancer Neg Hx     No Known Allergies  Medication list has been reviewed and updated.  Current Outpatient Medications on File Prior to Visit  Medication Sig Dispense Refill  . ALPRAZolam (XANAX) 0.25 MG  tablet TAKE 1 TABLET (0.25 MG TOTAL) BY MOUTH 2 (TWO) TIMES DAILY. 180 tablet 1  . aspirin 81 MG tablet Take 81 mg by mouth daily.    . fenofibrate 160 MG tablet TAKE 1 TABLET BY MOUTH EVERY DAY 90 tablet 1  . gabapentin (NEURONTIN) 100 MG capsule Take 1 capsule (100 mg total) by mouth daily. 90 capsule 3  . lisinopril (ZESTRIL) 5 MG tablet TAKE 1 TABLET BY MOUTH EVERY DAY 90 tablet 1  . metoprolol tartrate (LOPRESSOR) 50 MG tablet TAKE 1 TABLET BY MOUTH TWICE A DAY 180 tablet 0   No current facility-administered medications on file prior to visit.    Review of Systems:  As per HPI- otherwise negative.   Physical Examination: Vitals:   12/06/19  1338  BP: 133/81  Pulse: 65  Resp: 16  Temp: 97.8 F (36.6 C)  SpO2: 97%   Vitals:   12/06/19 1338  Weight: 169 lb (76.7 kg)  Height: 5\' 3"  (1.6 m)   Body mass index is 29.94 kg/m. Ideal Body Weight: Weight in (lb) to have BMI = 25: 140.8  GEN: no acute distress.  Weight is stable, looks her normal self  HEENT: Atraumatic, Normocephalic.  Ears and Nose: No external deformity. CV: RRR, No M/G/R. No JVD. No thrill. No extra heart sounds. PULM: CTA B, no wheezes, crackles, rhonchi. No retractions. No resp. distress. No accessory muscle use.  Her abdomen is benign at this time ABD: S, NT, ND, +BS. No rebound. No HSM. EXTR: No c/c/e PSYCH: Normally interactive. Conversant.  Patient became tearful and seemed possibly afraid when we discussed doing a mammogram, bone density, colonoscopy.  Assessment and Plan: Chronic abdominal pain - Plan: dicyclomine (BENTYL) 10 MG capsule  Essential hypertension  Hyperlipidemia, unspecified hyperlipidemia type  Dementia without behavioral disturbance, unspecified dementia type [facility])  Here today for follow-up visit with her husband Tina Fitzpatrick has worsening dementia; her husband is making most decisions on her behalf at this time GI recommended doing a colonoscopy, this had to be postponed due to illness and I have not decided whether to proceed with this as of yet Her main issue right now is poorly defined abdominal pain.  I would like to have her try as needed Bentyl to see if that may offer any relief; they will let me know how this works for them Moderate medical decision making today  I asked him to follow-up in about 6 months for routine checkup and labs Blood pressures under good control She is taking her fenofibrate This visit occurred during the SARS-CoV-2 public health emergency.  Safety protocols were in place, including screening questions prior to the visit, additional usage of staff PPE, and extensive cleaning of exam room while  observing appropriate contact time as indicated for disinfecting solutions.    Signed Marylu Lund, MD

## 2019-12-06 ENCOUNTER — Encounter: Payer: Self-pay | Admitting: Family Medicine

## 2019-12-06 ENCOUNTER — Ambulatory Visit (INDEPENDENT_AMBULATORY_CARE_PROVIDER_SITE_OTHER): Payer: Medicare Other | Admitting: Family Medicine

## 2019-12-06 ENCOUNTER — Other Ambulatory Visit: Payer: Self-pay

## 2019-12-06 VITALS — BP 133/81 | HR 65 | Temp 97.8°F | Resp 16 | Ht 63.0 in | Wt 169.0 lb

## 2019-12-06 DIAGNOSIS — R109 Unspecified abdominal pain: Secondary | ICD-10-CM

## 2019-12-06 DIAGNOSIS — G8929 Other chronic pain: Secondary | ICD-10-CM | POA: Diagnosis not present

## 2019-12-06 DIAGNOSIS — F0390 Unspecified dementia without behavioral disturbance: Secondary | ICD-10-CM

## 2019-12-06 DIAGNOSIS — E785 Hyperlipidemia, unspecified: Secondary | ICD-10-CM

## 2019-12-06 DIAGNOSIS — I10 Essential (primary) hypertension: Secondary | ICD-10-CM

## 2019-12-06 MED ORDER — DICYCLOMINE HCL 10 MG PO CAPS: 10.0000 mg | ORAL_CAPSULE | Freq: Three times a day (TID) | ORAL | 1 refills | Status: DC

## 2019-12-11 ENCOUNTER — Telehealth: Payer: Self-pay | Admitting: Family Medicine

## 2019-12-11 ENCOUNTER — Other Ambulatory Visit: Payer: Self-pay | Admitting: Family Medicine

## 2019-12-11 DIAGNOSIS — E785 Hyperlipidemia, unspecified: Secondary | ICD-10-CM

## 2019-12-11 DIAGNOSIS — I10 Essential (primary) hypertension: Secondary | ICD-10-CM

## 2019-12-11 DIAGNOSIS — M5030 Other cervical disc degeneration, unspecified cervical region: Secondary | ICD-10-CM

## 2019-12-11 MED ORDER — GABAPENTIN 100 MG PO CAPS: 100.0000 mg | ORAL_CAPSULE | Freq: Every day | ORAL | 3 refills | Status: DC

## 2019-12-11 MED ORDER — FENOFIBRATE 160 MG PO TABS: 160.0000 mg | ORAL_TABLET | Freq: Every day | ORAL | 1 refills | Status: DC

## 2019-12-11 NOTE — Telephone Encounter (Signed)
Refills sent

## 2019-12-11 NOTE — Telephone Encounter (Signed)
Medication:gabapentin (NEURONTIN) 100 MG capsule lisinopril (ZESTRIL) 5 MG tablet  Has the patient contacted their pharmacy? No. (If no, request that the patient contact the pharmacy for the refill.) (If yes, when and what did the pharmacy advise?)  Preferred Pharmacy (with phone number or street name):   [id number] [place] [place], [place] - [address]  [address] [place] [address]  Phone:  [phone] Fax:  [phone]   Agent: Please be advised that RX refills may take up to 3 business days. We ask that you follow-up with your pharmacy.

## 2020-01-11 NOTE — Progress Notes (Deleted)
Subjective:   Tina Fitzpatrick is a 69 y.o. female who presents for an Initial Medicare Annual Wellness Visit.  Review of Systems     Home Safety/Smoke Alarms: Feels safe in home. Smoke alarms in place.    Female:       Mammo-       Dexa scan-        CCS-    Objective:    There were no vitals filed for this visit. There is no height or weight on file to calculate BMI.  Advanced Directives 10/09/2019 09/24/2019 12/21/2018 12/03/2018 09/08/2017 07/14/2017 07/06/2017  Does Patient Have a Medical Advance Directive? No No No No No No No  Would patient like information on creating a medical advance directive? No - Patient declined No - Patient declined - - - No - Patient declined No - Patient declined    Current Medications (verified) Outpatient Encounter Medications as of 01/12/2020  Medication Sig  . ALPRAZolam (XANAX) 0.25 MG tablet TAKE 1 TABLET (0.25 MG TOTAL) BY MOUTH 2 (TWO) TIMES DAILY.  Marland Kitchen aspirin 81 MG tablet Take 81 mg by mouth daily.  Marland Kitchen dicyclomine (BENTYL) 10 MG capsule Take 1 capsule (10 mg total) by mouth 4 (four) times daily -  before meals and at bedtime. Use as needed  . fenofibrate 160 MG tablet Take 1 tablet (160 mg total) by mouth daily.  Marland Kitchen gabapentin (NEURONTIN) 100 MG capsule Take 1 capsule (100 mg total) by mouth daily.  Marland Kitchen lisinopril (ZESTRIL) 5 MG tablet TAKE 1 TABLET BY MOUTH EVERY DAY  . metoprolol tartrate (LOPRESSOR) 50 MG tablet TAKE 1 TABLET BY MOUTH TWICE A DAY   No facility-administered encounter medications on file as of 01/12/2020.    Allergies (verified) Patient has no known allergies.   History: Past Medical History:  Diagnosis Date  . DDD (degenerative disc disease), cervical   . Dementia (HCC)   . Depression   . GERD (gastroesophageal reflux disease)   . Hyperlipidemia   . Hypertension   . Kidney stones    Past Surgical History:  Procedure Laterality Date  . kidney stones  1988  . NECK SURGERY  2008   Plate/Screws   Family History    Problem Relation Age of Onset  . Heart attack Mother   . Colon cancer Neg Hx   . Esophageal cancer Neg Hx    Social History   Socioeconomic History  . Marital status: Married    Spouse name: Not on file  . Number of children: 2  . Years of education: Not on file  . Highest education level: Not on file  Occupational History  . Not on file  Tobacco Use  . Smoking status: Former Smoker    Types: Cigarettes  . Smokeless tobacco: Never Used  Substance and Sexual Activity  . Alcohol use: No    Alcohol/week: 0.0 standard drinks  . Drug use: No  . Sexual activity: Yes  Other Topics Concern  . Not on file  Social History Narrative   Married with 2 children   Right handed    12 th grade    1 cup daily   Social Determinants of Health   Financial Resource Strain:   . Difficulty of Paying Living Expenses:   Food Insecurity:   . Worried About Programme researcher, broadcasting/film/video in the Last Year:   . Barista in the Last Year:   Transportation Needs:   . Freight forwarder (Medical):   Marland Kitchen  Lack of Transportation (Non-Medical):   Physical Activity:   . Days of Exercise per Week:   . Minutes of Exercise per Session:   Stress:   . Feeling of Stress :   Social Connections:   . Frequency of Communication with Friends and Family:   . Frequency of Social Gatherings with Friends and Family:   . Attends Religious Services:   . Active Member of Clubs or Organizations:   . Attends Banker Meetings:   Marland Kitchen Marital Status:     Tobacco Counseling Counseling given: Not Answered   Clinical Intake:                        Activities of Daily Living No flowsheet data found.   Immunizations and Health Maintenance Immunization History  Administered Date(s) Administered  . Influenza Split 06/27/2012  . Influenza, High Dose Seasonal PF 07/08/2017  . Influenza,inj,Quad PF,6+ Mos 06/22/2013, 06/10/2015, 11/02/2016, 05/26/2018  . Pneumococcal Conjugate-13 07/08/2017   . Pneumococcal Polysaccharide-23 11/10/2018   Health Maintenance Due  Topic Date Due  . COVID-19 Vaccine (1) Never done  . COLONOSCOPY  09/15/2011  . MAMMOGRAM  12/17/2019    Patient Care Team: Copland, Gwenlyn Found, MD as PCP - General (Family Medicine) Linus Salmons, MD as Consulting Physician (Unknown Physician Specialty)  Indicate any recent Medical Services you may have received from other than [facility] providers in the past year (date may be approximate).     Assessment:   This is a routine wellness examination for Tina Fitzpatrick. Physical assessment deferred to PCP.   Hearing/Vision screen No exam data present  Dietary issues and exercise activities discussed:   Diet (meal preparation, eat out, water intake, caffeinated beverages, dairy products, fruits and vegetables): {Desc; diets:16563} Breakfast: Lunch:  Dinner:      Goals   None    Depression Screen PHQ 2/9 Scores 11/11/2015 05/09/2015  PHQ - 2 Score 0 0    Fall Risk Fall Risk  11/11/2015 05/09/2015  Falls in the past year? Yes No  Number falls in past yr: 2 or more -  Injury with Fall? No -  Risk for fall due to : Mental status change;Other (Comment) -  Risk for fall due to: Comment pt fell off toilet, and also fell when speaking with husband -    Cognitive Function: Ad8 score reviewed for issues:  Issues making decisions:  Less interest in hobbies / activities:  Repeats questions, stories (family complaining):  Trouble using ordinary gadgets (microwave, computer, phone):  Forgets the month or year:   Mismanaging finances:   Remembering appts:  Daily problems with thinking and/or memory: Ad8 score is=     MMSE - Mini Mental State Exam 11/18/2015 06/24/2015  Orientation to time 3 2  Orientation to Place 5 5  Registration 3 3  Attention/ Calculation 5 2  Recall 0 0  Language- name 2 objects 2 2  Language- repeat 1 1  Language- follow 3 step command 3 2  Language- read & follow direction 1 1   Write a sentence 1 1  Copy design 0 0  Total score 24 19        Screening Tests Health Maintenance  Topic Date Due  . COVID-19 Vaccine (1) Never done  . COLONOSCOPY  09/15/2011  . MAMMOGRAM  12/17/2019  . INFLUENZA VACCINE  04/14/2020  . TETANUS/TDAP  12/29/2021  . DEXA SCAN  Completed  . Hepatitis C Screening  Completed  . PNA  vac Low Risk Adult  Completed      Plan:   ***  I have personally reviewed and noted the following in the patient's chart:   . Medical and social history . Use of alcohol, tobacco or illicit drugs  . Current medications and supplements . Functional ability and status . Nutritional status . Physical activity . Advanced directives . List of other physicians . Hospitalizations, surgeries, and ER visits in previous 12 months . Vitals . Screenings to include cognitive, depression, and falls . Referrals and appointments  In addition, I have reviewed and discussed with patient certain preventive protocols, quality metrics, and best practice recommendations. A written personalized care plan for preventive services as well as general preventive health recommendations were provided to patient.     Naaman Plummer [place], [place]   01/11/2020

## 2020-01-12 ENCOUNTER — Other Ambulatory Visit: Payer: Self-pay

## 2020-01-12 ENCOUNTER — Ambulatory Visit: Payer: Medicare Other | Admitting: *Deleted

## 2020-02-16 ENCOUNTER — Other Ambulatory Visit: Payer: Self-pay | Admitting: Family Medicine

## 2020-02-16 DIAGNOSIS — I10 Essential (primary) hypertension: Secondary | ICD-10-CM

## 2020-03-09 NOTE — Progress Notes (Deleted)
[facility] at [facility] [address], [address] [place], [place] [address] [phone]  Date:  03/11/2020   Name:  Tina Fitzpatrick   DOB:  [birth year]   MRN:  [id number]  PCP:  Pearline Cables, MD    Chief Complaint: No chief complaint on file.   History of Present Illness:  Tina Fitzpatrick is a 69 y.o. very pleasant female patient who presents with the following:  Patient with history of dementia, hypertension, hyperlipidemia Last seen by myself in March of this year She lives with her husband, he notes that she cannot be left at home alone for longer periods.  At her last visit, he had started bringing her to work with him She has also complained of poorly defined abdominal pain for some time We got a CT of her abdomen pelvis in March 2020 which was negative Most recent labs in January of this year Patient Active Problem List   Diagnosis Date Noted  . Normochromic normocytic anemia 09/09/2017  . Acute encephalopathy 09/08/2017  . 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 Orthostatic hypotension 10/30/2015  . Mild dementia (HCC) 07/29/2015  . Encephalopathy, metabolic 06/24/2015  . Syncope 06/22/2013  . GERD (gastroesophageal reflux disease)   . Essential hypertension   . Hyperlipidemia   . Depression   . DDD (degenerative disc disease), cervical     Past Medical History:  Diagnosis Date  . DDD (degenerative disc disease), cervical   . Dementia (HCC)   . Depression   . GERD (gastroesophageal reflux disease)   . Hyperlipidemia   . Hypertension   . Kidney stones     Past Surgical History:  Procedure Laterality Date  . kidney stones  1988  . NECK SURGERY  2008   Plate/Screws    Social History   Tobacco Use  . Smoking status: Former Smoker    Types: Cigarettes  . Smokeless tobacco: Never Used  Vaping Use  . Vaping Use: Never used  Substance Use Topics  . Alcohol use: No     Alcohol/week: 0.0 standard drinks  . Drug use: No    Family History  Problem Relation Age of Onset  . Heart attack Mother   . Colon cancer Neg Hx   . Esophageal cancer Neg Hx     No Known Allergies  Medication list has been reviewed and updated.  Current Outpatient Medications on File Prior to Visit  Medication Sig Dispense Refill  . ALPRAZolam (XANAX) 0.25 MG tablet TAKE 1 TABLET (0.25 MG TOTAL) BY MOUTH 2 (TWO) TIMES DAILY. 180 tablet 1  . aspirin 81 MG tablet Take 81 mg by mouth daily.    Marland Kitchen dicyclomine (BENTYL) 10 MG capsule Take 1 capsule (10 mg total) by mouth 4 (four) times daily -  before meals and at bedtime. Use as needed 40 capsule 1  . fenofibrate 160 MG tablet Take 1 tablet (160 mg total) by mouth daily. 90 tablet 1  . gabapentin (NEURONTIN) 100 MG capsule Take 1 capsule (100 mg total) by mouth daily. 90 capsule 3  . lisinopril (ZESTRIL) 5 MG tablet TAKE 1 TABLET BY MOUTH EVERY DAY 90 tablet 1  . metoprolol tartrate (LOPRESSOR) 50 MG tablet TAKE 1 TABLET BY MOUTH TWICE A DAY 180 tablet 2   No current facility-administered medications on file prior to visit.    Review of Systems:  As per HPI- otherwise negative.   Physical Examination:  There were no vitals filed for this visit. There were no vitals filed for this visit. There is no height or weight on file to calculate BMI. Ideal Body Weight:    GEN: no acute distress. HEENT: Atraumatic, Normocephalic.  Ears and Nose: No external deformity. CV: RRR, No M/G/R. No JVD. No thrill. No extra heart sounds. PULM: CTA B, no wheezes, crackles, rhonchi. No retractions. No resp. distress. No accessory muscle use. ABD: S, NT, ND, +BS. No rebound. No HSM. EXTR: No c/c/e PSYCH: Normally interactive. Conversant.    Assessment and Plan: *** This visit occurred during the SARS-CoV-2 public health emergency.  Safety protocols were in place, including screening questions prior to the visit, additional usage of staff PPE,  and extensive cleaning of exam room while observing appropriate contact time as indicated for disinfecting solutions.    Signed Lamar Blinks, MD

## 2020-03-11 ENCOUNTER — Ambulatory Visit: Payer: Medicare Other | Admitting: Family Medicine

## 2020-04-17 ENCOUNTER — Emergency Department (HOSPITAL_BASED_OUTPATIENT_CLINIC_OR_DEPARTMENT_OTHER): Payer: Medicare Other

## 2020-04-17 ENCOUNTER — Encounter (HOSPITAL_BASED_OUTPATIENT_CLINIC_OR_DEPARTMENT_OTHER): Payer: Self-pay | Admitting: *Deleted

## 2020-04-17 ENCOUNTER — Other Ambulatory Visit: Payer: Self-pay

## 2020-04-17 ENCOUNTER — Telehealth: Payer: Self-pay | Admitting: Family Medicine

## 2020-04-17 ENCOUNTER — Emergency Department (HOSPITAL_BASED_OUTPATIENT_CLINIC_OR_DEPARTMENT_OTHER)
Admission: EM | Admit: 2020-04-17 | Discharge: 2020-04-17 | Disposition: A | Discharge status: Home / Self Care | Payer: Medicare Other | Attending: Emergency Medicine | Admitting: Emergency Medicine

## 2020-04-17 DIAGNOSIS — R42 Dizziness and giddiness: Secondary | ICD-10-CM | POA: Insufficient documentation

## 2020-04-17 DIAGNOSIS — Z5321 Procedure and treatment not carried out due to patient leaving prior to being seen by health care provider: Secondary | ICD-10-CM | POA: Insufficient documentation

## 2020-04-17 LAB — URINALYSIS, ROUTINE W REFLEX MICROSCOPIC
Bilirubin Urine: NEGATIVE
Glucose, UA: NEGATIVE mg/dL
Hgb urine dipstick: NEGATIVE
Ketones, ur: NEGATIVE mg/dL
Leukocytes,Ua: NEGATIVE
Nitrite: NEGATIVE
Protein, ur: NEGATIVE mg/dL
Specific Gravity, Urine: 1.01 (ref 1.005–1.030)
pH: 6.5 (ref 5.0–8.0)

## 2020-04-17 LAB — COMPREHENSIVE METABOLIC PANEL
ALT: 21 U/L (ref 0–44)
AST: 26 U/L (ref 15–41)
Albumin: 4.8 g/dL (ref 3.5–5.0)
Alkaline Phosphatase: 47 U/L (ref 38–126)
Anion gap: 14 (ref 5–15)
BUN: 7 mg/dL — ABNORMAL LOW (ref 8–23)
CO2: 24 mmol/L (ref 22–32)
Calcium: 9.9 mg/dL (ref 8.9–10.3)
Chloride: 102 mmol/L (ref 98–111)
Creatinine, Ser: 0.87 mg/dL (ref 0.44–1.00)
GFR calc Af Amer: >60 mL/min (ref >60)
GFR calc non Af Amer: >60 mL/min (ref >60)
Glucose, Bld: 137 mg/dL — ABNORMAL HIGH (ref 70–99)
Potassium: 4 mmol/L (ref 3.5–5.1)
Sodium: 140 mmol/L (ref 135–145)
Total Bilirubin: 0.5 mg/dL (ref 0.3–1.2)
Total Protein: 8.1 g/dL (ref 6.5–8.1)

## 2020-04-17 LAB — CBC WITH DIFFERENTIAL/PLATELET
Abs Immature Granulocytes: 0.03 10*3/uL (ref 0.00–0.07)
Basophils Absolute: 0.1 10*3/uL (ref 0.0–0.1)
Basophils Relative: 1 %
Eosinophils Absolute: 0.1 10*3/uL (ref 0.0–0.5)
Eosinophils Relative: 1 %
HCT: 42.6 % (ref 36.0–46.0)
Hemoglobin: 14.3 g/dL (ref 12.0–15.0)
Immature Granulocytes: 0 %
Lymphocytes Relative: 24 %
Lymphs Abs: 1.7 10*3/uL (ref 0.7–4.0)
MCH: 31.2 pg (ref 26.0–34.0)
MCHC: 33.6 g/dL (ref 30.0–36.0)
MCV: 92.8 fL (ref 80.0–100.0)
Monocytes Absolute: 0.4 10*3/uL (ref 0.1–1.0)
Monocytes Relative: 5 %
Neutro Abs: 4.9 10*3/uL (ref 1.7–7.7)
Neutrophils Relative %: 69 %
Platelets: 246 10*3/uL (ref 150–400)
RBC: 4.59 MIL/uL (ref 3.87–5.11)
RDW: 11.8 % (ref 11.5–15.5)
WBC: 7.2 10*3/uL (ref 4.0–10.5)
nRBC: 0 % (ref 0.0–0.2)

## 2020-04-17 NOTE — Telephone Encounter (Signed)
Caller: [employer] # [phone]  Fax # [phone]  Per [facility] @ [place], Patient needs  FL-2 Forms filled out .   FL-2 form needs have Dementia diagnosis and any other diagnoses associated with this patient. A list of patient medications.  Information should be faxed to number above.   Please Advise

## 2020-04-17 NOTE — ED Notes (Signed)
Called pt   No response from lobby or outside  

## 2020-04-17 NOTE — ED Triage Notes (Signed)
c/o dizziness x 2 months

## 2020-04-18 NOTE — Telephone Encounter (Signed)
Completed form and returned to [place], ready to fax

## 2020-04-18 NOTE — Telephone Encounter (Signed)
Started form, placed in providers folder for review and signature. I will fax once completed.

## 2020-04-22 ENCOUNTER — Telehealth: Payer: Self-pay

## 2020-04-22 NOTE — Telephone Encounter (Signed)
-----   Message from Pearline Cables, MD sent at 04/22/2020 10:46 AM EDT ----- Patient is on schedule for Wednesday for [facility] 2 paperwork completion.  However, I already did her paperwork.  Would you please call and see if they would like to keep the appointment or have [place] cancel it?  JC

## 2020-04-22 NOTE — Telephone Encounter (Signed)
Could you please help me in calling her?

## 2020-04-23 NOTE — Progress Notes (Signed)
[facility] at [facility] [address], [address] [place], [place] [address] [phone]  Date:  04/24/2020   Name:  Tina Fitzpatrick   DOB:  1951/02/27   MRN:  [id number]  PCP:  Pearline Cables, MD    Chief Complaint: No chief complaint on file.   History of Present Illness:  Tina Fitzpatrick is a 69 y.o. very pleasant female patient who presents with the following:  Pt with history of dementia, HTN, GERD She will be entering an assisted living facility and needs a TB screening first Pt is seen today with her husband [name] who is her main caretaker Breshae has been suffering from worsening dementia for a few years.  [name] has been taking her to work with him as she cannot be left alone for long, but her memory issues are getting worse. They now plan to have her admitted to assisted living.  I have already completed [place] 2 form, they need tuberculosis screening [name] chose to do a QuantiFERON gold today for convenience  Lisinopril Metoprolol covid series mammo Colon Most recent labs jut a few days ago when she was in the ER - she had blood drawn, head CT and EKG  but it does not look like she was actually seen - pt left.    Patient Active Problem List   Diagnosis Date Noted  . Normochromic normocytic anemia 09/09/2017  . Acute encephalopathy 09/08/2017  . 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 Orthostatic hypotension 10/30/2015  . Mild dementia (HCC) 07/29/2015  . Encephalopathy, metabolic 06/24/2015  . Syncope 06/22/2013  . GERD (gastroesophageal reflux disease)   . Essential hypertension   . Hyperlipidemia   . Depression   . DDD (degenerative disc disease), cervical     Past Medical History:  Diagnosis Date  . DDD (degenerative disc disease), cervical   . Dementia (HCC)   . Depression   . GERD (gastroesophageal reflux disease)   . Hyperlipidemia   . Hypertension   . Kidney stones     Past  Surgical History:  Procedure Laterality Date  . kidney stones  1988  . NECK SURGERY  2008   Plate/Screws    Social History   Tobacco Use  . Smoking status: Former Smoker    Types: Cigarettes  . Smokeless tobacco: Never Used  Vaping Use  . Vaping Use: Never used  Substance Use Topics  . Alcohol use: No    Alcohol/week: 0.0 standard drinks  . Drug use: No    Family History  Problem Relation Age of Onset  . Heart attack Mother   . Colon cancer Neg Hx   . Esophageal cancer Neg Hx     No Known Allergies  Medication list has been reviewed and updated.  Current Outpatient Medications on File Prior to Visit  Medication Sig Dispense Refill  . ALPRAZolam (XANAX) 0.25 MG tablet TAKE 1 TABLET (0.25 MG TOTAL) BY MOUTH 2 (TWO) TIMES DAILY. 180 tablet 1  . aspirin 81 MG tablet Take 81 mg by mouth daily.    . fenofibrate 160 MG tablet Take 1 tablet (160 mg total) by mouth daily. 90 tablet 1  . gabapentin (NEURONTIN) 100 MG capsule Take 1 capsule (100 mg total) by mouth daily. 90 capsule 3  . lisinopril (ZESTRIL) 5 MG tablet TAKE 1 TABLET BY MOUTH EVERY DAY 90 tablet 1  . metoprolol tartrate (LOPRESSOR) 50 MG tablet TAKE 1 TABLET  BY MOUTH TWICE A DAY 180 tablet 2   No current facility-administered medications on file prior to visit.    Review of Systems:  As per HPI- otherwise negative.   Physical Examination: Vitals:   04/24/20 0933  BP: 122/80  Pulse: 85  Resp: 15  SpO2: 96%   Vitals:   04/24/20 0933  Weight: 166 lb (75.3 kg)  Height: 5\' 6"  (1.676 m)   Body mass index is 26.79 kg/m. Ideal Body Weight: Weight in (lb) to have BMI = 25: 154.6  GEN: no acute distress.  Looks well and her normal self HEENT: Atraumatic, Normocephalic.  Ears and Nose: No external deformity. CV: RRR, No M/G/R. No JVD. No thrill. No extra heart sounds. PULM: CTA B, no wheezes, crackles, rhonchi. No retractions. No resp. distress. No accessory muscle use. EXTR: No c/c/e PSYCH: Normally  interactive. Conversant.    Assessment and Plan: Screening for tuberculosis - Plan: QuantiFERON-TB Gold Plus  Screen for tuberculosis today with QuantiFERON gold blood test- Will plan further follow- up pending labs.  This visit occurred during the SARS-CoV-2 public health emergency.  Safety protocols were in place, including screening questions prior to the visit, additional usage of staff PPE, and extensive cleaning of exam room while observing appropriate contact time as indicated for disinfecting solutions.   Will fax results for pt- her husband will let me know the number  Signed , MD

## 2020-04-23 NOTE — Patient Instructions (Addendum)
Good to see you again today!  Please get me the fax number at your convenience and I will fax in your TB test result Consider getting a mammogram, colon cancer screening, and the covid 19 vaccine

## 2020-04-24 ENCOUNTER — Telehealth: Payer: Self-pay | Admitting: Family Medicine

## 2020-04-24 ENCOUNTER — Encounter: Payer: Self-pay | Admitting: Family Medicine

## 2020-04-24 ENCOUNTER — Other Ambulatory Visit: Payer: Self-pay

## 2020-04-24 ENCOUNTER — Ambulatory Visit (INDEPENDENT_AMBULATORY_CARE_PROVIDER_SITE_OTHER): Payer: Medicare Other | Admitting: Family Medicine

## 2020-04-24 VITALS — BP 122/80 | HR 85 | Resp 15 | Ht 66.0 in | Wt 166.0 lb

## 2020-04-24 DIAGNOSIS — Z022 Encounter for examination for admission to residential institution: Secondary | ICD-10-CM

## 2020-04-24 DIAGNOSIS — Z111 Encounter for screening for respiratory tuberculosis: Secondary | ICD-10-CM

## 2020-04-24 DIAGNOSIS — R079 Chest pain, unspecified: Secondary | ICD-10-CM | POA: Diagnosis not present

## 2020-04-24 DIAGNOSIS — R634 Abnormal weight loss: Secondary | ICD-10-CM | POA: Diagnosis not present

## 2020-04-24 NOTE — Telephone Encounter (Signed)
Pt states spouse request TB results sent to [facility] to [facility] [phone].

## 2020-04-24 NOTE — Telephone Encounter (Signed)
Waiting on TB results can take 3-4 days typically. Will fax once it is back.

## 2020-04-26 NOTE — Telephone Encounter (Signed)
Patient's spouse called back  requesting status of TB results.

## 2020-04-27 LAB — QUANTIFERON-TB GOLD PLUS
Mitogen-NIL: 8.31 IU/mL
NIL: 0.03 IU/mL
QuantiFERON-TB Gold Plus: NEGATIVE
TB1-NIL: <0 IU/mL
TB2-NIL: 0 IU/mL

## 2020-04-29 ENCOUNTER — Telehealth: Payer: Self-pay | Admitting: Family Medicine

## 2020-04-29 DIAGNOSIS — G300 Alzheimer's disease with early onset: Secondary | ICD-10-CM | POA: Diagnosis not present

## 2020-04-29 DIAGNOSIS — I10 Essential (primary) hypertension: Secondary | ICD-10-CM | POA: Diagnosis not present

## 2020-04-29 DIAGNOSIS — M5090 Cervical disc disorder, unspecified, unspecified cervical region: Secondary | ICD-10-CM | POA: Diagnosis not present

## 2020-04-29 NOTE — Telephone Encounter (Signed)
Dee-Dee Dannielle Karvonen    Requesting lab results faxed to [facility].  Fax: [phone]

## 2020-04-29 NOTE — Telephone Encounter (Signed)
Results have been faxed

## 2020-04-29 NOTE — Telephone Encounter (Signed)
Results faxed for patient.

## 2020-05-01 DIAGNOSIS — Z20822 Contact with and (suspected) exposure to covid-19: Secondary | ICD-10-CM | POA: Diagnosis not present

## 2020-05-01 DIAGNOSIS — K760 Fatty (change of) liver, not elsewhere classified: Secondary | ICD-10-CM | POA: Diagnosis not present

## 2020-05-01 DIAGNOSIS — Z79899 Other long term (current) drug therapy: Secondary | ICD-10-CM | POA: Diagnosis not present

## 2020-05-01 DIAGNOSIS — E785 Hyperlipidemia, unspecified: Secondary | ICD-10-CM | POA: Diagnosis not present

## 2020-05-01 DIAGNOSIS — R1032 Left lower quadrant pain: Secondary | ICD-10-CM | POA: Diagnosis not present

## 2020-05-01 DIAGNOSIS — I10 Essential (primary) hypertension: Secondary | ICD-10-CM | POA: Diagnosis not present

## 2020-05-01 DIAGNOSIS — Z87891 Personal history of nicotine dependence: Secondary | ICD-10-CM | POA: Diagnosis not present

## 2020-05-02 DIAGNOSIS — R1032 Left lower quadrant pain: Secondary | ICD-10-CM | POA: Diagnosis not present

## 2020-05-02 DIAGNOSIS — Z20822 Contact with and (suspected) exposure to covid-19: Secondary | ICD-10-CM | POA: Diagnosis not present

## 2020-05-02 DIAGNOSIS — K760 Fatty (change of) liver, not elsewhere classified: Secondary | ICD-10-CM | POA: Diagnosis not present

## 2020-05-03 DIAGNOSIS — M2550 Pain in unspecified joint: Secondary | ICD-10-CM | POA: Diagnosis not present

## 2020-05-03 DIAGNOSIS — Z7401 Bed confinement status: Secondary | ICD-10-CM | POA: Diagnosis not present

## 2020-05-03 DIAGNOSIS — R6889 Other general symptoms and signs: Secondary | ICD-10-CM | POA: Diagnosis not present

## 2020-05-03 DIAGNOSIS — Z743 Need for continuous supervision: Secondary | ICD-10-CM | POA: Diagnosis not present

## 2020-05-06 DIAGNOSIS — G300 Alzheimer's disease with early onset: Secondary | ICD-10-CM | POA: Diagnosis not present

## 2020-05-21 DIAGNOSIS — G300 Alzheimer's disease with early onset: Secondary | ICD-10-CM | POA: Diagnosis not present

## 2020-05-21 DIAGNOSIS — I10 Essential (primary) hypertension: Secondary | ICD-10-CM | POA: Diagnosis not present

## 2020-05-21 DIAGNOSIS — R140 Abdominal distension (gaseous): Secondary | ICD-10-CM | POA: Diagnosis not present

## 2020-05-23 DIAGNOSIS — R109 Unspecified abdominal pain: Secondary | ICD-10-CM | POA: Diagnosis not present

## 2020-05-23 DIAGNOSIS — Z79899 Other long term (current) drug therapy: Secondary | ICD-10-CM | POA: Diagnosis not present

## 2020-06-04 NOTE — Progress Notes (Deleted)
[facility] [address] [place], [place] [address] [phone]  Date:  06/06/2020   Name:  Tina Fitzpatrick   DOB:  May 03, 1951   MRN:  [id number]  PCP:  Pearline Cables, MD    Chief Complaint: No chief complaint on file.   History of Present Illness:  Tina Fitzpatrick is a 69 y.o. 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 10/31/2015   Orthostatic hypotension 10/30/2015   Mild dementia (HCC) 07/29/2015   Encephalopathy, metabolic 06/24/2015   Syncope 16/09/930   GERD (gastroesophageal reflux disease)    Essential hypertension    Hyperlipidemia    Depression    DDD (degenerative disc disease), cervical     Past Medical History:  Diagnosis Date   DDD (degenerative disc disease), cervical    Dementia (HCC)    Depression    GERD (gastroesophageal reflux disease)    Hyperlipidemia    Hypertension    Kidney stones     Past Surgical History:  Procedure Laterality Date   kidney stones  1988   NECK SURGERY  2008   Plate/Screws    Social History   Tobacco Use   Smoking status: Former Smoker    Types: Cigarettes   Smokeless tobacco: Never Used  Building services engineer Use: Never used  Substance Use Topics   Alcohol use: No    Alcohol/week: 0.0 standard drinks   Drug use: No    Family History  Problem Relation Age of Onset   Heart attack Mother    Colon cancer Neg Hx    Esophageal cancer Neg Hx     No Known  Allergies  Medication list has been reviewed and updated.  Current Outpatient Medications on File Prior to Visit  Medication Sig Dispense Refill   ALPRAZolam (XANAX) 0.25 MG tablet TAKE 1 TABLET (0.25 MG TOTAL) BY MOUTH 2 (TWO) TIMES DAILY. 180 tablet 1   aspirin 81 MG tablet Take 81 mg by mouth daily.     fenofibrate 160 MG tablet Take 1 tablet (160 mg total) by mouth daily. 90 tablet 1   gabapentin (NEURONTIN) 100 MG capsule Take 1 capsule (100 mg total) by mouth daily. 90 capsule 3   lisinopril (ZESTRIL) 5 MG tablet TAKE 1 TABLET BY MOUTH EVERY DAY 90 tablet 1   metoprolol tartrate (LOPRESSOR) 50 MG tablet TAKE 1 TABLET BY MOUTH TWICE A DAY 180 tablet 2   No current facility-administered medications on file prior to visit.    Review of Systems:  As per HPI- otherwise negative.   Physical Examination: There were no vitals filed for this visit. There were no vitals filed for this visit. There is no height or weight on file to calculate BMI. Ideal Body Weight:    GEN: no acute distress. HEENT: Atraumatic, Normocephalic.  Ears and Nose: No external deformity. CV: RRR, No M/G/R. No JVD. No thrill. No extra heart sounds. PULM: CTA B, no wheezes, crackles, rhonchi. No retractions. No resp. distress. No accessory muscle  use. ABD: S, NT, ND, +BS. No rebound. No HSM. EXTR: No c/c/e PSYCH: Normally interactive. Conversant.    Assessment and Plan: ***  Signed Abbe Amsterdam, MD

## 2020-06-06 ENCOUNTER — Ambulatory Visit: Payer: Medicare Other | Admitting: Family Medicine

## 2020-06-06 DIAGNOSIS — Z0289 Encounter for other administrative examinations: Secondary | ICD-10-CM

## 2020-07-01 DIAGNOSIS — L84 Corns and callosities: Secondary | ICD-10-CM | POA: Diagnosis not present

## 2020-07-01 DIAGNOSIS — G300 Alzheimer's disease with early onset: Secondary | ICD-10-CM | POA: Diagnosis not present

## 2020-07-26 DIAGNOSIS — H2513 Age-related nuclear cataract, bilateral: Secondary | ICD-10-CM | POA: Diagnosis not present

## 2020-07-29 DIAGNOSIS — Z87440 Personal history of urinary (tract) infections: Secondary | ICD-10-CM | POA: Diagnosis not present

## 2020-07-29 DIAGNOSIS — R5381 Other malaise: Secondary | ICD-10-CM | POA: Diagnosis not present

## 2020-07-29 DIAGNOSIS — L84 Corns and callosities: Secondary | ICD-10-CM | POA: Diagnosis not present

## 2020-07-29 DIAGNOSIS — G300 Alzheimer's disease with early onset: Secondary | ICD-10-CM | POA: Diagnosis not present

## 2020-11-18 DIAGNOSIS — M5090 Cervical disc disorder, unspecified, unspecified cervical region: Secondary | ICD-10-CM | POA: Diagnosis not present

## 2020-11-18 DIAGNOSIS — G300 Alzheimer's disease with early onset: Secondary | ICD-10-CM | POA: Diagnosis not present

## 2020-11-18 DIAGNOSIS — Z87440 Personal history of urinary (tract) infections: Secondary | ICD-10-CM | POA: Diagnosis not present

## 2020-11-18 DIAGNOSIS — Z741 Need for assistance with personal care: Secondary | ICD-10-CM | POA: Diagnosis not present

## 2020-11-18 DIAGNOSIS — I10 Essential (primary) hypertension: Secondary | ICD-10-CM | POA: Diagnosis not present

## 2020-11-18 DIAGNOSIS — Z0000 Encounter for general adult medical examination without abnormal findings: Secondary | ICD-10-CM | POA: Diagnosis not present

## 2020-11-18 DIAGNOSIS — M4322 Fusion of spine, cervical region: Secondary | ICD-10-CM | POA: Diagnosis not present

## 2020-11-25 ENCOUNTER — Other Ambulatory Visit: Payer: Self-pay | Admitting: Physician Assistant

## 2020-11-25 DIAGNOSIS — Z1231 Encounter for screening mammogram for malignant neoplasm of breast: Secondary | ICD-10-CM

## 2020-11-27 DIAGNOSIS — Z79899 Other long term (current) drug therapy: Secondary | ICD-10-CM | POA: Diagnosis not present

## 2020-11-27 DIAGNOSIS — N179 Acute kidney failure, unspecified: Secondary | ICD-10-CM | POA: Diagnosis not present

## 2020-11-27 DIAGNOSIS — I10 Essential (primary) hypertension: Secondary | ICD-10-CM | POA: Diagnosis not present

## 2021-01-20 ENCOUNTER — Ambulatory Visit
Admission: RE | Admit: 2021-01-20 | Discharge: 2021-01-20 | Disposition: A | Discharge status: Home / Self Care | Payer: Medicare Other | Source: Ambulatory Visit | Attending: Physician Assistant | Admitting: Physician Assistant

## 2021-01-20 ENCOUNTER — Other Ambulatory Visit: Payer: Self-pay

## 2021-01-20 DIAGNOSIS — Z1231 Encounter for screening mammogram for malignant neoplasm of breast: Secondary | ICD-10-CM | POA: Diagnosis not present

## 2021-02-07 DIAGNOSIS — Z03818 Encounter for observation for suspected exposure to other biological agents ruled out: Secondary | ICD-10-CM | POA: Diagnosis not present

## 2021-03-10 DIAGNOSIS — Z9119 Patient's noncompliance with other medical treatment and regimen: Secondary | ICD-10-CM | POA: Diagnosis not present

## 2021-03-10 DIAGNOSIS — G300 Alzheimer's disease with early onset: Secondary | ICD-10-CM | POA: Diagnosis not present

## 2021-05-05 DIAGNOSIS — S0081XA Abrasion of other part of head, initial encounter: Secondary | ICD-10-CM | POA: Diagnosis not present

## 2021-05-05 DIAGNOSIS — I10 Essential (primary) hypertension: Secondary | ICD-10-CM | POA: Diagnosis not present

## 2021-05-05 DIAGNOSIS — G300 Alzheimer's disease with early onset: Secondary | ICD-10-CM | POA: Diagnosis not present

## 2021-05-15 DIAGNOSIS — R300 Dysuria: Secondary | ICD-10-CM | POA: Diagnosis not present

## 2021-05-20 DIAGNOSIS — N390 Urinary tract infection, site not specified: Secondary | ICD-10-CM | POA: Diagnosis not present

## 2021-06-17 DIAGNOSIS — M25552 Pain in left hip: Secondary | ICD-10-CM | POA: Diagnosis not present

## 2021-06-26 DIAGNOSIS — H2513 Age-related nuclear cataract, bilateral: Secondary | ICD-10-CM | POA: Diagnosis not present

## 2021-08-18 DIAGNOSIS — R2681 Unsteadiness on feet: Secondary | ICD-10-CM | POA: Diagnosis not present

## 2021-08-18 DIAGNOSIS — G300 Alzheimer's disease with early onset: Secondary | ICD-10-CM | POA: Diagnosis not present

## 2021-08-18 DIAGNOSIS — F02818 Dementia in other diseases classified elsewhere, unspecified severity, with other behavioral disturbance: Secondary | ICD-10-CM | POA: Diagnosis not present

## 2021-08-18 DIAGNOSIS — R634 Abnormal weight loss: Secondary | ICD-10-CM | POA: Diagnosis not present

## 2021-08-27 DIAGNOSIS — Z79899 Other long term (current) drug therapy: Secondary | ICD-10-CM | POA: Diagnosis not present

## 2021-08-27 DIAGNOSIS — K643 Fourth degree hemorrhoids: Secondary | ICD-10-CM | POA: Diagnosis not present

## 2021-08-27 DIAGNOSIS — G309 Alzheimer's disease, unspecified: Secondary | ICD-10-CM | POA: Diagnosis not present

## 2021-08-27 DIAGNOSIS — R2681 Unsteadiness on feet: Secondary | ICD-10-CM | POA: Diagnosis not present

## 2021-08-27 DIAGNOSIS — R278 Other lack of coordination: Secondary | ICD-10-CM | POA: Diagnosis not present

## 2021-08-27 DIAGNOSIS — R634 Abnormal weight loss: Secondary | ICD-10-CM | POA: Diagnosis not present

## 2021-08-27 DIAGNOSIS — M6281 Muscle weakness (generalized): Secondary | ICD-10-CM | POA: Diagnosis not present

## 2021-08-28 DIAGNOSIS — M6281 Muscle weakness (generalized): Secondary | ICD-10-CM | POA: Diagnosis not present

## 2021-08-28 DIAGNOSIS — G309 Alzheimer's disease, unspecified: Secondary | ICD-10-CM | POA: Diagnosis not present

## 2021-08-28 DIAGNOSIS — R2681 Unsteadiness on feet: Secondary | ICD-10-CM | POA: Diagnosis not present

## 2021-08-29 DIAGNOSIS — M6281 Muscle weakness (generalized): Secondary | ICD-10-CM | POA: Diagnosis not present

## 2021-08-29 DIAGNOSIS — R2681 Unsteadiness on feet: Secondary | ICD-10-CM | POA: Diagnosis not present

## 2021-08-29 DIAGNOSIS — G309 Alzheimer's disease, unspecified: Secondary | ICD-10-CM | POA: Diagnosis not present

## 2021-08-30 DIAGNOSIS — R278 Other lack of coordination: Secondary | ICD-10-CM | POA: Diagnosis not present

## 2021-08-30 DIAGNOSIS — R2681 Unsteadiness on feet: Secondary | ICD-10-CM | POA: Diagnosis not present

## 2021-08-30 DIAGNOSIS — M6389 Disorders of muscle in diseases classified elsewhere, multiple sites: Secondary | ICD-10-CM | POA: Diagnosis not present

## 2021-09-01 DIAGNOSIS — G309 Alzheimer's disease, unspecified: Secondary | ICD-10-CM | POA: Diagnosis not present

## 2021-09-01 DIAGNOSIS — M6281 Muscle weakness (generalized): Secondary | ICD-10-CM | POA: Diagnosis not present

## 2021-09-01 DIAGNOSIS — R2681 Unsteadiness on feet: Secondary | ICD-10-CM | POA: Diagnosis not present

## 2021-09-02 DIAGNOSIS — M6389 Disorders of muscle in diseases classified elsewhere, multiple sites: Secondary | ICD-10-CM | POA: Diagnosis not present

## 2021-09-02 DIAGNOSIS — M6281 Muscle weakness (generalized): Secondary | ICD-10-CM | POA: Diagnosis not present

## 2021-09-02 DIAGNOSIS — R2681 Unsteadiness on feet: Secondary | ICD-10-CM | POA: Diagnosis not present

## 2021-09-02 DIAGNOSIS — G309 Alzheimer's disease, unspecified: Secondary | ICD-10-CM | POA: Diagnosis not present

## 2021-09-03 DIAGNOSIS — M6281 Muscle weakness (generalized): Secondary | ICD-10-CM | POA: Diagnosis not present

## 2021-09-03 DIAGNOSIS — G309 Alzheimer's disease, unspecified: Secondary | ICD-10-CM | POA: Diagnosis not present

## 2021-09-03 DIAGNOSIS — M6389 Disorders of muscle in diseases classified elsewhere, multiple sites: Secondary | ICD-10-CM | POA: Diagnosis not present

## 2021-09-03 DIAGNOSIS — R2681 Unsteadiness on feet: Secondary | ICD-10-CM | POA: Diagnosis not present

## 2021-09-04 DIAGNOSIS — R2681 Unsteadiness on feet: Secondary | ICD-10-CM | POA: Diagnosis not present

## 2021-09-04 DIAGNOSIS — G309 Alzheimer's disease, unspecified: Secondary | ICD-10-CM | POA: Diagnosis not present

## 2021-09-04 DIAGNOSIS — M6389 Disorders of muscle in diseases classified elsewhere, multiple sites: Secondary | ICD-10-CM | POA: Diagnosis not present

## 2021-09-04 DIAGNOSIS — M6281 Muscle weakness (generalized): Secondary | ICD-10-CM | POA: Diagnosis not present

## 2021-09-05 DIAGNOSIS — M6281 Muscle weakness (generalized): Secondary | ICD-10-CM | POA: Diagnosis not present

## 2021-09-05 DIAGNOSIS — M6389 Disorders of muscle in diseases classified elsewhere, multiple sites: Secondary | ICD-10-CM | POA: Diagnosis not present

## 2021-09-05 DIAGNOSIS — G309 Alzheimer's disease, unspecified: Secondary | ICD-10-CM | POA: Diagnosis not present

## 2021-09-05 DIAGNOSIS — R2681 Unsteadiness on feet: Secondary | ICD-10-CM | POA: Diagnosis not present

## 2021-09-09 DIAGNOSIS — G309 Alzheimer's disease, unspecified: Secondary | ICD-10-CM | POA: Diagnosis not present

## 2021-09-09 DIAGNOSIS — M6389 Disorders of muscle in diseases classified elsewhere, multiple sites: Secondary | ICD-10-CM | POA: Diagnosis not present

## 2021-09-09 DIAGNOSIS — R2681 Unsteadiness on feet: Secondary | ICD-10-CM | POA: Diagnosis not present

## 2021-09-09 DIAGNOSIS — M6281 Muscle weakness (generalized): Secondary | ICD-10-CM | POA: Diagnosis not present

## 2021-09-10 DIAGNOSIS — R2681 Unsteadiness on feet: Secondary | ICD-10-CM | POA: Diagnosis not present

## 2021-09-10 DIAGNOSIS — M6281 Muscle weakness (generalized): Secondary | ICD-10-CM | POA: Diagnosis not present

## 2021-09-10 DIAGNOSIS — M6389 Disorders of muscle in diseases classified elsewhere, multiple sites: Secondary | ICD-10-CM | POA: Diagnosis not present

## 2021-09-10 DIAGNOSIS — G309 Alzheimer's disease, unspecified: Secondary | ICD-10-CM | POA: Diagnosis not present

## 2021-09-11 DIAGNOSIS — R278 Other lack of coordination: Secondary | ICD-10-CM | POA: Diagnosis not present

## 2021-09-11 DIAGNOSIS — G309 Alzheimer's disease, unspecified: Secondary | ICD-10-CM | POA: Diagnosis not present

## 2021-09-11 DIAGNOSIS — M6389 Disorders of muscle in diseases classified elsewhere, multiple sites: Secondary | ICD-10-CM | POA: Diagnosis not present

## 2021-09-11 DIAGNOSIS — R2681 Unsteadiness on feet: Secondary | ICD-10-CM | POA: Diagnosis not present

## 2021-09-11 DIAGNOSIS — M6281 Muscle weakness (generalized): Secondary | ICD-10-CM | POA: Diagnosis not present

## 2021-09-12 DIAGNOSIS — M6389 Disorders of muscle in diseases classified elsewhere, multiple sites: Secondary | ICD-10-CM | POA: Diagnosis not present

## 2021-09-12 DIAGNOSIS — M6281 Muscle weakness (generalized): Secondary | ICD-10-CM | POA: Diagnosis not present

## 2021-09-12 DIAGNOSIS — R2681 Unsteadiness on feet: Secondary | ICD-10-CM | POA: Diagnosis not present

## 2021-09-12 DIAGNOSIS — G309 Alzheimer's disease, unspecified: Secondary | ICD-10-CM | POA: Diagnosis not present

## 2021-09-15 DIAGNOSIS — M6389 Disorders of muscle in diseases classified elsewhere, multiple sites: Secondary | ICD-10-CM | POA: Diagnosis not present

## 2021-09-15 DIAGNOSIS — R278 Other lack of coordination: Secondary | ICD-10-CM | POA: Diagnosis not present

## 2021-09-15 DIAGNOSIS — R2681 Unsteadiness on feet: Secondary | ICD-10-CM | POA: Diagnosis not present

## 2021-09-16 DIAGNOSIS — F02818 Dementia in other diseases classified elsewhere, unspecified severity, with other behavioral disturbance: Secondary | ICD-10-CM | POA: Diagnosis not present

## 2021-09-16 DIAGNOSIS — G300 Alzheimer's disease with early onset: Secondary | ICD-10-CM | POA: Diagnosis not present

## 2021-09-16 DIAGNOSIS — R2681 Unsteadiness on feet: Secondary | ICD-10-CM | POA: Diagnosis not present

## 2021-09-16 DIAGNOSIS — G309 Alzheimer's disease, unspecified: Secondary | ICD-10-CM | POA: Diagnosis not present

## 2021-09-16 DIAGNOSIS — R278 Other lack of coordination: Secondary | ICD-10-CM | POA: Diagnosis not present

## 2021-09-16 DIAGNOSIS — M6389 Disorders of muscle in diseases classified elsewhere, multiple sites: Secondary | ICD-10-CM | POA: Diagnosis not present

## 2021-09-16 DIAGNOSIS — M6281 Muscle weakness (generalized): Secondary | ICD-10-CM | POA: Diagnosis not present

## 2021-09-17 DIAGNOSIS — G309 Alzheimer's disease, unspecified: Secondary | ICD-10-CM | POA: Diagnosis not present

## 2021-09-17 DIAGNOSIS — M6281 Muscle weakness (generalized): Secondary | ICD-10-CM | POA: Diagnosis not present

## 2021-09-17 DIAGNOSIS — R2681 Unsteadiness on feet: Secondary | ICD-10-CM | POA: Diagnosis not present

## 2021-09-17 DIAGNOSIS — M6389 Disorders of muscle in diseases classified elsewhere, multiple sites: Secondary | ICD-10-CM | POA: Diagnosis not present

## 2021-09-18 DIAGNOSIS — G309 Alzheimer's disease, unspecified: Secondary | ICD-10-CM | POA: Diagnosis not present

## 2021-09-18 DIAGNOSIS — M6281 Muscle weakness (generalized): Secondary | ICD-10-CM | POA: Diagnosis not present

## 2021-09-18 DIAGNOSIS — R2681 Unsteadiness on feet: Secondary | ICD-10-CM | POA: Diagnosis not present

## 2021-09-18 DIAGNOSIS — M6389 Disorders of muscle in diseases classified elsewhere, multiple sites: Secondary | ICD-10-CM | POA: Diagnosis not present

## 2021-09-19 DIAGNOSIS — M6281 Muscle weakness (generalized): Secondary | ICD-10-CM | POA: Diagnosis not present

## 2021-09-19 DIAGNOSIS — G309 Alzheimer's disease, unspecified: Secondary | ICD-10-CM | POA: Diagnosis not present

## 2021-09-19 DIAGNOSIS — M6389 Disorders of muscle in diseases classified elsewhere, multiple sites: Secondary | ICD-10-CM | POA: Diagnosis not present

## 2021-09-19 DIAGNOSIS — R2681 Unsteadiness on feet: Secondary | ICD-10-CM | POA: Diagnosis not present

## 2021-09-22 DIAGNOSIS — R2681 Unsteadiness on feet: Secondary | ICD-10-CM | POA: Diagnosis not present

## 2021-09-22 DIAGNOSIS — M6281 Muscle weakness (generalized): Secondary | ICD-10-CM | POA: Diagnosis not present

## 2021-09-22 DIAGNOSIS — M6389 Disorders of muscle in diseases classified elsewhere, multiple sites: Secondary | ICD-10-CM | POA: Diagnosis not present

## 2021-09-22 DIAGNOSIS — G309 Alzheimer's disease, unspecified: Secondary | ICD-10-CM | POA: Diagnosis not present

## 2021-09-23 DIAGNOSIS — R2681 Unsteadiness on feet: Secondary | ICD-10-CM | POA: Diagnosis not present

## 2021-09-23 DIAGNOSIS — M6389 Disorders of muscle in diseases classified elsewhere, multiple sites: Secondary | ICD-10-CM | POA: Diagnosis not present

## 2021-09-24 DIAGNOSIS — M6389 Disorders of muscle in diseases classified elsewhere, multiple sites: Secondary | ICD-10-CM | POA: Diagnosis not present

## 2021-09-24 DIAGNOSIS — R2681 Unsteadiness on feet: Secondary | ICD-10-CM | POA: Diagnosis not present

## 2021-09-25 DIAGNOSIS — M6389 Disorders of muscle in diseases classified elsewhere, multiple sites: Secondary | ICD-10-CM | POA: Diagnosis not present

## 2021-09-25 DIAGNOSIS — R2681 Unsteadiness on feet: Secondary | ICD-10-CM | POA: Diagnosis not present

## 2021-09-26 DIAGNOSIS — M6389 Disorders of muscle in diseases classified elsewhere, multiple sites: Secondary | ICD-10-CM | POA: Diagnosis not present

## 2021-09-26 DIAGNOSIS — R2681 Unsteadiness on feet: Secondary | ICD-10-CM | POA: Diagnosis not present

## 2021-09-28 DIAGNOSIS — R2681 Unsteadiness on feet: Secondary | ICD-10-CM | POA: Diagnosis not present

## 2021-09-28 DIAGNOSIS — M6389 Disorders of muscle in diseases classified elsewhere, multiple sites: Secondary | ICD-10-CM | POA: Diagnosis not present

## 2021-09-29 DIAGNOSIS — M6389 Disorders of muscle in diseases classified elsewhere, multiple sites: Secondary | ICD-10-CM | POA: Diagnosis not present

## 2021-09-29 DIAGNOSIS — R2681 Unsteadiness on feet: Secondary | ICD-10-CM | POA: Diagnosis not present

## 2022-06-18 ENCOUNTER — Emergency Department (HOSPITAL_COMMUNITY)
Admission: EM | Admit: 2022-06-18 | Discharge: 2022-06-18 | Disposition: A | Discharge status: Hospice | Attending: Emergency Medicine | Admitting: Emergency Medicine

## 2022-06-18 ENCOUNTER — Emergency Department (HOSPITAL_COMMUNITY)

## 2022-06-18 ENCOUNTER — Other Ambulatory Visit: Payer: Self-pay

## 2022-06-18 DIAGNOSIS — S0181XA Laceration without foreign body of other part of head, initial encounter: Secondary | ICD-10-CM | POA: Insufficient documentation

## 2022-06-18 DIAGNOSIS — X58XXXA Exposure to other specified factors, initial encounter: Secondary | ICD-10-CM | POA: Diagnosis not present

## 2022-06-18 DIAGNOSIS — Z79899 Other long term (current) drug therapy: Secondary | ICD-10-CM | POA: Insufficient documentation

## 2022-06-18 DIAGNOSIS — F0390 Unspecified dementia without behavioral disturbance: Secondary | ICD-10-CM | POA: Insufficient documentation

## 2022-06-18 DIAGNOSIS — Z7982 Long term (current) use of aspirin: Secondary | ICD-10-CM | POA: Insufficient documentation

## 2022-06-18 DIAGNOSIS — S0990XA Unspecified injury of head, initial encounter: Secondary | ICD-10-CM | POA: Diagnosis present

## 2022-06-18 DIAGNOSIS — I10 Essential (primary) hypertension: Secondary | ICD-10-CM | POA: Diagnosis not present

## 2022-06-18 MED ORDER — HALOPERIDOL LACTATE 5 MG/ML IJ SOLN: 2.0000 mg | Freq: Once | INTRAMUSCULAR | Status: AC

## 2022-06-18 MED ORDER — HALOPERIDOL LACTATE 5 MG/ML IJ SOLN
INTRAMUSCULAR | Status: AC
  Administered 2022-06-18: 2 mg via INTRAMUSCULAR
  Filled 2022-06-18: qty 1

## 2022-06-18 NOTE — ED Notes (Signed)
Ptar called, 3rd in line 

## 2022-06-18 NOTE — ED Notes (Signed)
Patient is resting in bed. Awaiting PTAR

## 2022-06-18 NOTE — ED Triage Notes (Signed)
Pt BIB [facility] from [facility]. Staff at SNF reports pt had been leaning more to the left per normal hitting head on several objects on the wall throughout the day. Presents to ED with several lacerations, no bleeding at this time. Pt GCS 11, at baseline. Nonverbal able to follow commands. HX Dementia, wandering.

## 2022-06-18 NOTE — ED Provider Notes (Signed)
[facility] EMERGENCY DEPARTMENT Provider Note   CSN: [id number] Arrival date & time: 06/18/22  2979     History  Chief Complaint  Patient presents with   Head Injury    Tina Fitzpatrick is a 71 y.o. female.   Head Injury  Patient is presenting to the ED from her memory care facility due to her "brushing her head against a wall multiple times".  Patient reportedly leaning to the left chronically whenever she ambulates, this is not a new problem for her.  Patient reportedly was leaning more to the left than usual and hit her head against the wall multiple times.  Patient had no reported falls.  She been doing this for several days and therefore they brought her to the emergency department for evaluation after she has sustained a small laceration to her forehead.  Patient does not have any hemodynamic instability, fevers, chills, nausea, vomiting, abdominal pain was reported by EMS or nursing care facility.     Home Medications Prior to Admission medications   Medication Sig Start Date End Date Taking? Authorizing Provider  ALPRAZolam (XANAX) 0.25 MG tablet TAKE 1 TABLET (0.25 MG TOTAL) BY MOUTH 2 (TWO) TIMES DAILY. 07/31/19   Copland, Gwenlyn Found, MD  aspirin 81 MG tablet Take 81 mg by mouth daily.    [provider]  fenofibrate 160 MG tablet Take 1 tablet (160 mg total) by mouth daily. 12/11/19   Copland, Gwenlyn Found, MD  gabapentin (NEURONTIN) 100 MG capsule Take 1 capsule (100 mg total) by mouth daily. 12/11/19   Copland, Gwenlyn Found, MD  lisinopril (ZESTRIL) 5 MG tablet TAKE 1 TABLET BY MOUTH EVERY DAY 12/11/19   Copland, Gwenlyn Found, MD  metoprolol tartrate (LOPRESSOR) 50 MG tablet TAKE 1 TABLET BY MOUTH TWICE A DAY 02/16/20   Copland, Gwenlyn Found, MD      Allergies    Patient has no known allergies.    Review of Systems   Review of Systems  Physical Exam Updated Vital Signs BP 95/62 (BP Location: Left Arm)   Pulse 75   Temp 98.9 F (37.2 C) (Oral)    Resp 14   SpO2 97%  Physical Exam Vitals and nursing note reviewed.  Constitutional:      General: She is not in acute distress.    Appearance: She is well-developed.     Comments: Elderly, Chronically ill-appearing  HENT:     Head: Normocephalic.     Comments: Minor abrasions noted to the left temporal region, no significant lacerations requiring repair, no palpable fractures Eyes:     Conjunctiva/sclera: Conjunctivae normal.  Cardiovascular:     Rate and Rhythm: Normal rate and regular rhythm.     Heart sounds: No murmur heard. Pulmonary:     Effort: Pulmonary effort is normal. No respiratory distress.     Breath sounds: Normal breath sounds.  Abdominal:     Palpations: Abdomen is soft.     Tenderness: There is no abdominal tenderness.  Musculoskeletal:        General: No swelling.     Cervical back: Neck supple.  Skin:    General: Skin is warm and dry.     Capillary Refill: Capillary refill takes less than 2 seconds.  Neurological:     Mental Status: She is alert. Mental status is at baseline. She is disoriented.     Comments: Patient is a GCS of 5/4/4  Psychiatric:        Mood and Affect:  Mood normal.     ED Results / Procedures / Treatments   Labs (all labs ordered are listed, but only abnormal results are displayed) Labs Reviewed - No data to display  EKG None  Radiology CT Cervical Spine Wo Contrast  Result Date: 06/18/2022 CLINICAL DATA:  Ataxia, cervical trauma. EXAM: CT CERVICAL SPINE WITHOUT CONTRAST TECHNIQUE: Multidetector CT imaging of the cervical spine was performed without intravenous contrast. Multiplanar CT image reconstructions were also generated. RADIATION DOSE REDUCTION: This exam was performed according to the departmental dose-optimization program which includes automated exposure control, adjustment of the mA and/or kV according to patient size and/or use of iterative reconstruction technique. COMPARISON:  MRI 12/14/2007 FINDINGS: Alignment:  Normal. Skull base and vertebrae: ACDF C5-C7 with instrumentation and solid incorporation of interbody bone graft. Ankylosis of the facet joints at this level. Craniocervical alignment is normal with rotary subluxation noted at C1-2. No acute fracture of the cervical spine. Vertebral body height is preserved. Soft tissues and spinal canal: Streak artifact related to cervical fusion hardware noted. No canal hematoma. No prevertebral soft tissue swelling or paraspinal fluid collections identified. No high-grade canal stenosis. Disc levels: Endplate remodeling at B3-5 and C7-T1 is in keeping with changes of a mild to moderate degenerative disc disease. Prevertebral soft tissues are not thickened on sagittal reformats. Multilevel uncovertebral and facet arthrosis results in multilevel mild neuroforaminal narrowing, most severe on the right at C3-4. Upper chest: Negative. Other: None IMPRESSION: 1. No acute fracture of the cervical spine. 2. ACDF C5-C7 with solid incorporation of interbody bone graft. 3. Multilevel degenerative disc and degenerative joint disease resulting in multilevel mild neuroforaminal narrowing, most severe on the right at C3-4. Electronically Signed   By: Fidela Salisbury M.D.   On: 06/18/2022 21:19   CT HEAD WO CONTRAST (5MM)  Result Date: 06/18/2022 CLINICAL DATA:  Leaning leftward after blunt trauma EXAM: CT HEAD WITHOUT CONTRAST TECHNIQUE: Contiguous axial images were obtained from the base of the skull through the vertex without intravenous contrast. RADIATION DOSE REDUCTION: This exam was performed according to the departmental dose-optimization program which includes automated exposure control, adjustment of the mA and/or kV according to patient size and/or use of iterative reconstruction technique. COMPARISON:  04/17/2020 and previous FINDINGS: Brain: Mild parenchymal atrophy. Patchy areas of hypoattenuation in deep and periventricular white matter bilaterally. Negative for acute  intracranial hemorrhage, mass lesion, acute infarction, midline shift, or mass-effect. Acute infarct may be inapparent on noncontrast CT. Ventricles and sulci symmetric. Vascular: No hyperdense vessel or unexpected calcification. Skull: Normal. Negative for fracture or focal lesion. Sinuses/Orbits: Patchy opacification of ethmoid air cells left greater than right. Negative for fracture. Orbits unremarkable. Other: Left frontal scalp soft tissue swelling IMPRESSION: 1. Negative for bleed or other acute intracranial process. 2. Stable mild atrophy and nonspecific white matter changes. Electronically Signed   By: Lucrezia Europe M.D.   On: 06/18/2022 21:09    Procedures Procedures    Medications Ordered in ED Medications  haloperidol lactate (HALDOL) injection 2 mg (2 mg Intramuscular Given 06/18/22 2201)    ED Course/ Medical Decision Making/ A&P                          Medical Decision Making Amount and/or Complexity of Data Reviewed Independent Historian: EMS Labs:  Decision-making details documented in ED Course. Radiology: ordered. Decision-making details documented in ED Course. ECG/medicine tests:  Decision-making details documented in ED Course.  Risk Prescription drug management.  Medical Decision Making  This patient is Presenting for Evaluation of minor head injury, patient has a past medical history dementia, GERD, HTN which complicates their presentation.  Of which does require a range of treatment options, and is a complaint that involves a moderate risk of morbidity and mortality.  Arrived in ED by:  EMS History obtained from: EMS Limitations in history: Patients history of dementia    At this time I am most concerned for minor head trauma. Also considering intracranial abnormality, skull fracture, progressive dementia, organic delirium . Plan for imaging study workup, observation   Radiologic work-up was significant for: -CT head and C spine with no acute abnormalities     Interventions and Interval History: -Patient was hemodynamically stable, remained at her neurologic baseline, was ambulatory during her time in the emergency department.  Patient's imaging study work-up was unremarkable.  Patient has that her neurologic baseline and has no evidence of significant trauma, additional trauma imaging studies are not felt to be clinically indicated at this time.  Patient has no evidence of infection as been vitally stable, do not feel the patient requires additional laboratory work-up at this time.  Patient is felt to be appropriate for discharge back to her nursing home for further management.   Additional documents reviewed: Clinic notes    Disposition: Due to the patients current presenting symptoms, physical exam findings, and the workup stated above, it is thought that the etiology of the patients current presentation is minor head trauma   Discharge: Patient is felt to be medically appropriate for discharge at this time. Patient was informed of all pertinent physical exam, laboratory, and imaging findings. Patients suspected etiology of their symptom presentation was discussed with the patient and all questions were answered. Patient was instructed to follow up with their primary care doctor as needed for re-evaluation. Patient was given strict return precautions.    The plan for this patient was discussed with Dr. Silverio Lay, who voiced agreement and who oversaw evaluation and treatment of this patient.     Clinical Complexity  A medically appropriate history, review of systems, and physical exam was performed.   I personally reviewed the lab and imaging studies discussed above.   MDM generated using voice dictation software and may contain dictation errors. Please contact me for any clarification or with any questions.               Final Clinical Impression(s) / ED Diagnoses Final diagnoses:  Minor head injury, initial encounter    Rx /  DC Orders ED Discharge Orders     None         Sheranda Seabrooks, [place], MD 06/18/22 2233    Charlynne Pander, MD 06/18/22 2240

## 2022-06-18 NOTE — Discharge Instructions (Addendum)
Please return to the emergency department if you develop any new or worsening symptoms, difficulty walking, weakness on one side of your body or extremities, significant falls or any other concerning symptoms.  Please follow-up with your primary care doctor as needed

## 2022-06-18 NOTE — ED Notes (Signed)
Pt placed on posey sitter alarm. Visible from nurses station. Bed lowest position. Primary RN at bedside.

## 2022-06-18 NOTE — ED Notes (Incomplete)
Patient at this time managed to wandered to waiting room.

## 2022-06-18 NOTE — ED Notes (Signed)
Patient found wandering off to [facility], staff assisted pt back to bed. PT attempts several times to leave bed and cont. To wander off. Pt is uncooperative, kicks everything off bed, including Posey Sitter. Pt was provided a sandwich, and warm blankets to provide comfort. Pt cont to wander off into hallway after being told to remain in bed.   Provider notified.

## 2022-06-21 ENCOUNTER — Other Ambulatory Visit: Payer: Self-pay

## 2022-06-21 ENCOUNTER — Emergency Department (HOSPITAL_COMMUNITY)
Admission: EM | Admit: 2022-06-21 | Discharge: 2022-06-22 | Disposition: A | Discharge status: Skilled Nursing Facility | Attending: Emergency Medicine | Admitting: Emergency Medicine

## 2022-06-21 ENCOUNTER — Emergency Department (HOSPITAL_COMMUNITY)

## 2022-06-21 ENCOUNTER — Encounter (HOSPITAL_COMMUNITY): Payer: Self-pay | Admitting: Emergency Medicine

## 2022-06-21 DIAGNOSIS — F0390 Unspecified dementia without behavioral disturbance: Secondary | ICD-10-CM | POA: Diagnosis not present

## 2022-06-21 DIAGNOSIS — W01198A Fall on same level from slipping, tripping and stumbling with subsequent striking against other object, initial encounter: Secondary | ICD-10-CM | POA: Insufficient documentation

## 2022-06-21 DIAGNOSIS — Z79899 Other long term (current) drug therapy: Secondary | ICD-10-CM | POA: Diagnosis not present

## 2022-06-21 DIAGNOSIS — Y92129 Unspecified place in nursing home as the place of occurrence of the external cause: Secondary | ICD-10-CM | POA: Insufficient documentation

## 2022-06-21 DIAGNOSIS — S0101XA Laceration without foreign body of scalp, initial encounter: Secondary | ICD-10-CM | POA: Diagnosis not present

## 2022-06-21 DIAGNOSIS — S0990XA Unspecified injury of head, initial encounter: Secondary | ICD-10-CM

## 2022-06-21 NOTE — Discharge Instructions (Signed)
[facility] can come out in 1 week.  Laceration needs to be cleaned twice a day with soap and water gently

## 2022-06-21 NOTE — ED Provider Notes (Signed)
[facility]-EMERGENCY DEPT Provider Note   CSN: [id number] Arrival date & time: 06/21/22  2038     History {Add pertinent medical, surgical, social history, OB history to HPI:1} Chief Complaint  Patient presents with   Tina Fitzpatrick is a 71 y.o. female.  Patient with a history of dementia.  She fell today and hit her head.  No loss of consciousness   Fall       Home Medications Prior to Admission medications   Medication Sig Start Date End Date Taking? Authorizing Provider  guaifenesin (ROBITUSSIN) 100 MG/5ML syrup Take 200 mg by mouth every 6 (six) hours as needed for cough.   Yes [provider]  LORazepam (ATIVAN) 0.5 MG tablet Take 0.5 mg by mouth 2 (two) times daily.   Yes [provider]  magnesium hydroxide (MILK OF MAGNESIA) 400 MG/5ML suspension Take 30 mLs by mouth daily as needed for mild constipation.   Yes [provider]  risperiDONE (RISPERDAL) 0.5 MG tablet Take 0.5 mg by mouth 2 (two) times daily.   Yes [provider]  traZODone (DESYREL) 50 MG tablet Take 50 mg by mouth See admin instructions. Take 1 tablet by mouth twice daily and take 25mg  by mouth in the afternoon   Yes [provider]  ALPRAZolam (XANAX) 0.25 MG tablet TAKE 1 TABLET (0.25 MG TOTAL) BY MOUTH 2 (TWO) TIMES DAILY. Patient not taking: Reported on 06/21/2022 07/31/19   Copland, 08/02/19, MD  fenofibrate 160 MG tablet Take 1 tablet (160 mg total) by mouth daily. Patient not taking: Reported on 06/21/2022 12/11/19   Copland, 12/13/19, MD  gabapentin (NEURONTIN) 100 MG capsule Take 1 capsule (100 mg total) by mouth daily. Patient not taking: Reported on 06/21/2022 12/11/19   Copland, 12/13/19, MD  lisinopril (ZESTRIL) 5 MG tablet TAKE 1 TABLET BY MOUTH EVERY DAY Patient not taking: Reported on 06/21/2022 12/11/19   Copland, 12/13/19, MD  metoprolol tartrate (LOPRESSOR) 50 MG tablet TAKE 1 TABLET BY MOUTH TWICE A  DAY Patient not taking: Reported on 06/21/2022 02/16/20   Copland, 04/17/20, MD      Allergies    Patient has no known allergies.    Review of Systems   Review of Systems  Physical Exam Updated Vital Signs BP (!) 157/85   Pulse (!) 103   Temp 98 F (36.7 C) (Axillary)   Resp 18   Wt 76 kg   SpO2 97%   BMI 27.04 kg/m  Physical Exam  ED Results / Procedures / Treatments   Labs (all labs ordered are listed, but only abnormal results are displayed) Labs Reviewed - No data to display  EKG None  Radiology CT Head Wo Contrast  Result Date: 06/21/2022 CLINICAL DATA:  Head trauma, intracranial arterial injury suspected; Neck trauma (Age >= 65y). Unwitnessed fall, scalp laceration. EXAM: CT HEAD WITHOUT CONTRAST CT CERVICAL SPINE WITHOUT CONTRAST TECHNIQUE: Multidetector CT imaging of the head and cervical spine was performed following the standard protocol without intravenous contrast. Multiplanar CT image reconstructions of the cervical spine were also generated. RADIATION DOSE REDUCTION: This exam was performed according to the departmental dose-optimization program which includes automated exposure control, adjustment of the mA and/or kV according to patient size and/or use of iterative reconstruction technique. COMPARISON:  06/18/2022 FINDINGS: CT HEAD FINDINGS Brain: Normal anatomic configuration. Moderate parenchymal volume loss is unchanged. Stable mild periventricular white matter changes are present likely reflecting the sequela of small  vessel ischemia. No abnormal intra or extra-axial mass lesion or fluid collection. No abnormal mass effect or midline shift. No evidence of acute intracranial hemorrhage or infarct. Ventricular size is normal. Cerebellum unremarkable. Vascular: No asymmetric hyperdense vasculature at the skull base. Skull: Intact Sinuses/Orbits: Paranasal sinuses are clear. Orbits are unremarkable. Other: Mastoid air cells and middle ear cavities are clear. Punctate  foci of gas are seen within the left occipital scalp toward the vertex. CT CERVICAL SPINE FINDINGS Alignment: Normal. Skull base and vertebrae: C5-7 anterior cervical discectomy and fusion with instrumentation and solid incorporation of interbody bone graft is again identified. Ankylosis of the facet joints of C5-C7. The T1 vertebral body is incompletely included on this examination. No acute fracture of the cervical spine. Visualized vertebral body height is preserved. Soft tissues and spinal canal: No prevertebral fluid or swelling. No visible canal hematoma. Disc levels: Endplate remodeling at C4-5 and C7-T1 is in keeping with changes of mild to moderate degenerative disc disease. Prevertebral soft tissues are not thickened on sagittal reformats. Spinal canal is widely patent. Multilevel facet hypertrophy results in multilevel mild-to-moderate neuroforaminal narrowing, most severe on the right at C3-4 and bilaterally at C6-7. Upper chest: Largely excluded Other: None IMPRESSION: 1. No acute intracranial abnormality. No calvarial fracture. 2. No acute fracture or listhesis of the cervical spine. Electronically Signed   By: Helyn Numbers M.D.   On: 06/21/2022 23:00   CT Cervical Spine Wo Contrast  Result Date: 06/21/2022 CLINICAL DATA:  Head trauma, intracranial arterial injury suspected; Neck trauma (Age >= 65y). Unwitnessed fall, scalp laceration. EXAM: CT HEAD WITHOUT CONTRAST CT CERVICAL SPINE WITHOUT CONTRAST TECHNIQUE: Multidetector CT imaging of the head and cervical spine was performed following the standard protocol without intravenous contrast. Multiplanar CT image reconstructions of the cervical spine were also generated. RADIATION DOSE REDUCTION: This exam was performed according to the departmental dose-optimization program which includes automated exposure control, adjustment of the mA and/or kV according to patient size and/or use of iterative reconstruction technique. COMPARISON:  06/18/2022  FINDINGS: CT HEAD FINDINGS Brain: Normal anatomic configuration. Moderate parenchymal volume loss is unchanged. Stable mild periventricular white matter changes are present likely reflecting the sequela of small vessel ischemia. No abnormal intra or extra-axial mass lesion or fluid collection. No abnormal mass effect or midline shift. No evidence of acute intracranial hemorrhage or infarct. Ventricular size is normal. Cerebellum unremarkable. Vascular: No asymmetric hyperdense vasculature at the skull base. Skull: Intact Sinuses/Orbits: Paranasal sinuses are clear. Orbits are unremarkable. Other: Mastoid air cells and middle ear cavities are clear. Punctate foci of gas are seen within the left occipital scalp toward the vertex. CT CERVICAL SPINE FINDINGS Alignment: Normal. Skull base and vertebrae: C5-7 anterior cervical discectomy and fusion with instrumentation and solid incorporation of interbody bone graft is again identified. Ankylosis of the facet joints of C5-C7. The T1 vertebral body is incompletely included on this examination. No acute fracture of the cervical spine. Visualized vertebral body height is preserved. Soft tissues and spinal canal: No prevertebral fluid or swelling. No visible canal hematoma. Disc levels: Endplate remodeling at C4-5 and C7-T1 is in keeping with changes of mild to moderate degenerative disc disease. Prevertebral soft tissues are not thickened on sagittal reformats. Spinal canal is widely patent. Multilevel facet hypertrophy results in multilevel mild-to-moderate neuroforaminal narrowing, most severe on the right at C3-4 and bilaterally at C6-7. Upper chest: Largely excluded Other: None IMPRESSION: 1. No acute intracranial abnormality. No calvarial fracture. 2. No acute fracture or listhesis  of the cervical spine. Electronically Signed   By: Fidela Salisbury M.D.   On: 06/21/2022 23:00    Procedures Procedures  {Document cardiac monitor, telemetry assessment procedure when  appropriate:1}  Medications Ordered in ED Medications - No data to display  ED Course/ Medical Decision Making/ A&P  Patient with 3 cm laceration to the occipital area of her head.  It was closed with 6 staples                         Medical Decision Making Amount and/or Complexity of Data Reviewed Radiology: ordered.   Patient with head injury and laceration to the back of her head  {Document critical care time when appropriate:1} {Document review of labs and clinical decision tools ie heart score, Chads2Vasc2 etc:1}  {Document your independent review of radiology images, and any outside records:1} {Document your discussion with family members, caretakers, and with consultants:1} {Document social determinants of health affecting pt's care:1} {Document your decision making why or why not admission, treatments were needed:1} Final Clinical Impression(s) / ED Diagnoses Final diagnoses:  Injury of head, initial encounter    Rx / DC Orders ED Discharge Orders     None

## 2022-06-21 NOTE — ED Triage Notes (Signed)
Pt BIB EMS from [facility] after an unwitnessed fall. Pt has laceration to the back of her head. Pt has dementia.

## 2022-07-02 ENCOUNTER — Emergency Department (HOSPITAL_COMMUNITY)
Admission: EM | Admit: 2022-07-02 | Discharge: 2022-07-02 | Disposition: A | Discharge status: Home / Self Care | Attending: Emergency Medicine | Admitting: Emergency Medicine

## 2022-07-02 ENCOUNTER — Other Ambulatory Visit: Payer: Self-pay

## 2022-07-02 ENCOUNTER — Emergency Department (HOSPITAL_COMMUNITY)

## 2022-07-02 ENCOUNTER — Encounter (HOSPITAL_COMMUNITY): Payer: Self-pay | Admitting: *Deleted

## 2022-07-02 DIAGNOSIS — Z79899 Other long term (current) drug therapy: Secondary | ICD-10-CM | POA: Diagnosis not present

## 2022-07-02 DIAGNOSIS — S0101XA Laceration without foreign body of scalp, initial encounter: Secondary | ICD-10-CM | POA: Insufficient documentation

## 2022-07-02 DIAGNOSIS — I10 Essential (primary) hypertension: Secondary | ICD-10-CM | POA: Diagnosis not present

## 2022-07-02 DIAGNOSIS — Z23 Encounter for immunization: Secondary | ICD-10-CM | POA: Insufficient documentation

## 2022-07-02 DIAGNOSIS — R410 Disorientation, unspecified: Secondary | ICD-10-CM | POA: Insufficient documentation

## 2022-07-02 DIAGNOSIS — W19XXXA Unspecified fall, initial encounter: Secondary | ICD-10-CM | POA: Insufficient documentation

## 2022-07-02 DIAGNOSIS — F0390 Unspecified dementia without behavioral disturbance: Secondary | ICD-10-CM | POA: Diagnosis not present

## 2022-07-02 DIAGNOSIS — D649 Anemia, unspecified: Secondary | ICD-10-CM | POA: Diagnosis not present

## 2022-07-02 DIAGNOSIS — S0990XA Unspecified injury of head, initial encounter: Secondary | ICD-10-CM | POA: Diagnosis present

## 2022-07-02 LAB — CBC WITH DIFFERENTIAL/PLATELET
Abs Immature Granulocytes: 0.03 10*3/uL (ref 0.00–0.07)
Basophils Absolute: 0.1 10*3/uL (ref 0.0–0.1)
Basophils Relative: 1 %
Eosinophils Absolute: 0 10*3/uL (ref 0.0–0.5)
Eosinophils Relative: 0 %
HCT: 31.4 % — ABNORMAL LOW (ref 36.0–46.0)
Hemoglobin: 10.5 g/dL — ABNORMAL LOW (ref 12.0–15.0)
Immature Granulocytes: 0 %
Lymphocytes Relative: 9 %
Lymphs Abs: 0.9 10*3/uL (ref 0.7–4.0)
MCH: 30.6 pg (ref 26.0–34.0)
MCHC: 33.4 g/dL (ref 30.0–36.0)
MCV: 91.5 fL (ref 80.0–100.0)
Monocytes Absolute: 0.6 10*3/uL (ref 0.1–1.0)
Monocytes Relative: 6 %
Neutro Abs: 8.9 10*3/uL — ABNORMAL HIGH (ref 1.7–7.7)
Neutrophils Relative %: 84 %
Platelets: 276 10*3/uL (ref 150–400)
RBC: 3.43 MIL/uL — ABNORMAL LOW (ref 3.87–5.11)
RDW: 13.8 % (ref 11.5–15.5)
WBC: 10.4 10*3/uL (ref 4.0–10.5)
nRBC: 0 % (ref 0.0–0.2)

## 2022-07-02 LAB — BASIC METABOLIC PANEL
Anion gap: 13 (ref 5–15)
BUN: 13 mg/dL (ref 8–23)
CO2: 22 mmol/L (ref 22–32)
Calcium: 9.3 mg/dL (ref 8.9–10.3)
Chloride: 105 mmol/L (ref 98–111)
Creatinine, Ser: 0.7 mg/dL (ref 0.44–1.00)
GFR, Estimated: >60 mL/min (ref >60)
Glucose, Bld: 111 mg/dL — ABNORMAL HIGH (ref 70–99)
Potassium: 4.1 mmol/L (ref 3.5–5.1)
Sodium: 140 mmol/L (ref 135–145)

## 2022-07-02 MED ORDER — LIDOCAINE-EPINEPHRINE-TETRACAINE (LET) TOPICAL GEL
3.0000 mL | Freq: Once | TOPICAL | Status: AC
  Administered 2022-07-02: 3 mL via TOPICAL
  Filled 2022-07-02: qty 3

## 2022-07-02 MED ORDER — TETANUS-DIPHTH-ACELL PERTUSSIS 5-2.5-18.5 LF-MCG/0.5 IM SUSY
0.5000 mL | PREFILLED_SYRINGE | Freq: Once | INTRAMUSCULAR | Status: AC
  Administered 2022-07-02: 0.5 mL via INTRAMUSCULAR
  Filled 2022-07-02: qty 0.5

## 2022-07-02 NOTE — ED Provider Notes (Signed)
[facility] EMERGENCY DEPARTMENT Provider Note   CSN: [id number] Arrival date & time: 07/02/22  9242     History  Chief Complaint  Patient presents with   Marletta Lor    Tina Fitzpatrick is a 71 y.o. female.   Fall   Patient with medical history of hypertension, hyperlipidemia, dementia baseline only oriented to self presents today due to fall.  EMS reported that the fall was unwitnessed at the facility.  Report unknown loss of consciousness but obvious bleed to the back of her head.  I called the facility [facility], I spoke with the nursing aide.  She reports patient fall was actually witnessed by herself.  Patient was combative, trying to get out of bed.  Patient attempted to ambulate and fell forward hitting her head.  Staff denies any loss of consciousness.  Denies any recent changes in medications or systemic symptoms.  Patient is unable to contribute to history due to mental status.  Not on blood thinners per chart review.    Home Medications Prior to Admission medications   Medication Sig Start Date End Date Taking? Authorizing Provider  ALPRAZolam (XANAX) 0.25 MG tablet TAKE 1 TABLET (0.25 MG TOTAL) BY MOUTH 2 (TWO) TIMES DAILY. Patient not taking: Reported on 06/21/2022 07/31/19   Copland, Gwenlyn Found, MD  fenofibrate 160 MG tablet Take 1 tablet (160 mg total) by mouth daily. Patient not taking: Reported on 06/21/2022 12/11/19   Copland, Gwenlyn Found, MD  gabapentin (NEURONTIN) 100 MG capsule Take 1 capsule (100 mg total) by mouth daily. Patient not taking: Reported on 06/21/2022 12/11/19   Copland, Gwenlyn Found, MD  guaifenesin (ROBITUSSIN) 100 MG/5ML syrup Take 200 mg by mouth every 6 (six) hours as needed for cough.    [provider]  lisinopril (ZESTRIL) 5 MG tablet TAKE 1 TABLET BY MOUTH EVERY DAY Patient not taking: Reported on 06/21/2022 12/11/19   Copland, Gwenlyn Found, MD  LORazepam (ATIVAN) 0.5 MG tablet Take 0.5 mg by mouth 2 (two) times daily.     [provider]  magnesium hydroxide (MILK OF MAGNESIA) 400 MG/5ML suspension Take 30 mLs by mouth daily as needed for mild constipation.    [provider]  metoprolol tartrate (LOPRESSOR) 50 MG tablet TAKE 1 TABLET BY MOUTH TWICE A DAY Patient not taking: Reported on 06/21/2022 02/16/20   Copland, Gwenlyn Found, MD  risperiDONE (RISPERDAL) 0.5 MG tablet Take 0.5 mg by mouth 2 (two) times daily.    [provider]  traZODone (DESYREL) 50 MG tablet Take 50 mg by mouth See admin instructions. Take 1 tablet by mouth twice daily and take 25mg  by mouth in the afternoon    [provider]      Allergies    Patient has no known allergies.    Review of Systems   Review of Systems  Physical Exam Updated Vital Signs BP (!) 145/87 (BP Location: Right Arm)   Pulse 98   Temp 97.7 F (36.5 C) (Oral)   Resp 20   Ht 5\' 6"  (1.676 m)   Wt 76 kg   SpO2 100%   BMI 27.04 kg/m  Physical Exam Vitals and nursing note reviewed. Exam conducted with a chaperone present.  Constitutional:      Appearance: Normal appearance.     Comments: Moving eyes, UE and LE extremities. Withdraws from stimuli.  Oriented only to self.  HENT:     Head: Normocephalic.     Comments: 5 cm laceration to left  parietal scalp just inferior to hematoma.  Patient has multiple superficial abrasions to the scalp surrounding the hematoma.   Eyes:     General: No scleral icterus.       Right eye: No discharge.        Left eye: No discharge.     Extraocular Movements: Extraocular movements intact.     Pupils: Pupils are equal, round, and reactive to light.  Cardiovascular:     Rate and Rhythm: Normal rate and regular rhythm.     Pulses: Normal pulses.     Heart sounds: Murmur heard.     No friction rub. No gallop.  Pulmonary:     Effort: Pulmonary effort is normal. No respiratory distress.     Breath sounds: Normal breath sounds.     Comments: Lung sounds are present in all fields Abdominal:      General: Abdomen is flat. Bowel sounds are normal. There is no distension.     Palpations: Abdomen is soft.     Tenderness: There is no abdominal tenderness.  Musculoskeletal:        General: Normal range of motion.     Cervical back: Normal range of motion.  Skin:    General: Skin is warm and dry.     Coloration: Skin is not jaundiced.  Neurological:     Mental Status: She is alert. Mental status is at baseline. She is disoriented.     ED Results / Procedures / Treatments   Labs (all labs ordered are listed, but only abnormal results are displayed) Labs Reviewed  BASIC METABOLIC PANEL - Abnormal; Notable for the following components:      Result Value   Glucose, Bld 111 (*)    All other components within normal limits  CBC WITH DIFFERENTIAL/PLATELET - Abnormal; Notable for the following components:   RBC 3.43 (*)    Hemoglobin 10.5 (*)    HCT 31.4 (*)    Neutro Abs 8.9 (*)    All other components within normal limits    EKG None  Radiology CT Head Wo Contrast  Result Date: 07/02/2022 CLINICAL DATA:  Trauma EXAM: CT HEAD WITHOUT CONTRAST CT CERVICAL SPINE WITHOUT CONTRAST TECHNIQUE: Multidetector CT imaging of the head and cervical spine was performed following the standard protocol without intravenous contrast. Multiplanar CT image reconstructions of the cervical spine were also generated. RADIATION DOSE REDUCTION: This exam was performed according to the departmental dose-optimization program which includes automated exposure control, adjustment of the mA and/or kV according to patient size and/or use of iterative reconstruction technique. COMPARISON:  CT Head 06/21/22, CT C spine 06/21/22 FINDINGS: CT HEAD FINDINGS Brain: No evidence of acute infarction, hemorrhage, hydrocephalus, extra-axial collection or mass lesion/mass effect. Sequela of chronic microvascular ischemic change with advanced generalized volume loss. Vascular: No hyperdense vessel or unexpected calcification.  Skull: There is a soft tissue hematoma and laceration along the left parietal scalp (series 4, image 26). There is no evidence of underlying calvarial fracture. Sinuses/Orbits: Trace bilateral mastoid effusions. Other: There is impacted cerumen in bilateral EACs. CT CERVICAL SPINE FINDINGS Alignment: There is straightening of the normal cervical lordosis. Skull base and vertebrae: Postsurgical changes from C5-C7 ACDF with solid fusion at the surgical levels. There is incomplete fusion of the posterior arch of C1. Soft tissues and spinal canal: No prevertebral fluid or swelling. No visible canal hematoma. Disc levels: No evidence of high-grade spinal canal or neural foraminal stenosis. Upper chest: Negative. Other: None IMPRESSION: 1. Soft tissue hematoma  and laceration along the left parietal scalp. No evidence of underlying calvarial fracture. 2. No CT evidence of intracranial injury. 3. No acute fracture or traumatic malalignment of the cervical spine. Electronically Signed   By: Lorenza Cambridge M.D.   On: 07/02/2022 08:06   CT Cervical Spine Wo Contrast  Result Date: 07/02/2022 CLINICAL DATA:  Trauma EXAM: CT HEAD WITHOUT CONTRAST CT CERVICAL SPINE WITHOUT CONTRAST TECHNIQUE: Multidetector CT imaging of the head and cervical spine was performed following the standard protocol without intravenous contrast. Multiplanar CT image reconstructions of the cervical spine were also generated. RADIATION DOSE REDUCTION: This exam was performed according to the departmental dose-optimization program which includes automated exposure control, adjustment of the mA and/or kV according to patient size and/or use of iterative reconstruction technique. COMPARISON:  CT Head 06/21/22, CT C spine 06/21/22 FINDINGS: CT HEAD FINDINGS Brain: No evidence of acute infarction, hemorrhage, hydrocephalus, extra-axial collection or mass lesion/mass effect. Sequela of chronic microvascular ischemic change with advanced generalized volume  loss. Vascular: No hyperdense vessel or unexpected calcification. Skull: There is a soft tissue hematoma and laceration along the left parietal scalp (series 4, image 26). There is no evidence of underlying calvarial fracture. Sinuses/Orbits: Trace bilateral mastoid effusions. Other: There is impacted cerumen in bilateral EACs. CT CERVICAL SPINE FINDINGS Alignment: There is straightening of the normal cervical lordosis. Skull base and vertebrae: Postsurgical changes from C5-C7 ACDF with solid fusion at the surgical levels. There is incomplete fusion of the posterior arch of C1. Soft tissues and spinal canal: No prevertebral fluid or swelling. No visible canal hematoma. Disc levels: No evidence of high-grade spinal canal or neural foraminal stenosis. Upper chest: Negative. Other: None IMPRESSION: 1. Soft tissue hematoma and laceration along the left parietal scalp. No evidence of underlying calvarial fracture. 2. No CT evidence of intracranial injury. 3. No acute fracture or traumatic malalignment of the cervical spine. Electronically Signed   By: Lorenza Cambridge M.D.   On: 07/02/2022 08:06    Procedures .Marland KitchenLaceration Repair  Date/Time: 07/02/2022 9:10 AM  Performed by: Theron Arista, PA-C Authorized by: Theron Arista, PA-C   Consent:    Consent obtained:  Verbal   Consent given by:  Patient   Risks discussed:  Infection, need for additional repair, pain, poor cosmetic result and poor wound healing   Alternatives discussed:  No treatment and delayed treatment Universal protocol:    Procedure explained and questions answered to patient or proxy's satisfaction: yes     Relevant documents present and verified: yes     Test results available: yes     Imaging studies available: yes     Required blood products, implants, devices, and special equipment available: yes     Site/side marked: yes     Immediately prior to procedure, a time out was called: yes     Patient identity confirmed:  Verbally with  patient Anesthesia:    Anesthesia method:  None Laceration details:    Location:  Scalp   Scalp location:  R parietal   Length (cm):  5   Depth (mm):  4 Pre-procedure details:    Preparation:  Patient was prepped and draped in usual sterile fashion and imaging obtained to evaluate for foreign bodies Exploration:    Limited defect created (wound extended): no     Hemostasis achieved with:  Direct pressure   Imaging outcome: foreign body not noted     Wound exploration: entire depth of wound visualized     Contaminated: no  Treatment:    Area cleansed with:  Povidone-iodine   Amount of cleaning:  Extensive   Irrigation solution:  Sterile saline   Irrigation volume:  1L   Irrigation method:  Pressure wash   Visualized foreign bodies/material removed: no     Debridement:  None   Undermining:  None   Scar revision: no   Skin repair:    Repair method:  Staples   Number of staples:  6 Approximation:    Approximation:  Close Repair type:    Repair type:  Simple Post-procedure details:    Dressing:  Open (no dressing) Comments:     Attending assisted during laceration repair with staples.  Patient has a hematoma r just superior to the start of the laceration. Did not want to staple directly into the hematoma so slight loose approximation in 1 part of the repair.  Hemostasis was achieved.     Medications Ordered in ED Medications  lidocaine-EPINEPHrine-tetracaine (LET) topical gel (has no administration in time range)  Tdap (BOOSTRIX) injection 0.5 mL (0.5 mLs Intramuscular Given 07/02/22 0908)    ED Course/ Medical Decision Making/ A&P                           Medical Decision Making Amount and/or Complexity of Data Reviewed Labs: ordered. Radiology: ordered.  Risk Prescription drug management.   Patient presents due to fall.  Differential includes but not limited to intracranial hemorrhage, basilar skull fracture, C-spine injury, metabolic derangement, arrhythmia,  medication side effect, mechanical fall.  I reviewed external medical records including recent ED visits.  This is patient's third ED visit within the month for fall.  I reviewed home medication list.  Patient is not anticoagulated, on multiple sedative medicines including Xanax, Ativan, trazodone.   Independent historian[facility]  Patient presentation appears consistent with minor head injury due to mechanical fall but given this is patient's third fall in 2-week I do think it is reasonable to check basic labs and EKG as well as CT imaging for fall/trauma to head.    I ordered, viewed and interpreted laboratory work-up. -CBC without leukocytosis.  Patient has an anemia 10.5, last h/h 05/01/20 showed hgb 12.9 so this is slightly decreased. -BMP without AKI or electrolyte derangement   I ordered, viewed CT imaging of head and cervical spine.  Agree with radiologist interpretation.  Patient has left parietal laceration but no acute intracranial hemorrhage or cervical spine injury.  No signs of additional trauma on exam so do not think any extremity imaging is indicated.  Her lungs are clear to auscultation as well so do not think she had a pneumothorax.  No systemic symptoms, she is afebrile does not appear septic.  No signs of metabolic derangement, AKI or underlying infection.  She is slightly anemic but there is no abdominal tenderness, chest wall tenderness or contusions that be suggestive of intrathoracic or intra-abdominal trauma especially given this was a ground-level fall and she is not anticoagulated I do not think additional imaging is indicated.  Advice outpatient follow-up regarding the anemia, this could be baseline given we only have labs from 2 years ago to compare to.    At this time I feel patient is stabilized and appropriate for outpatient follow-up.    Discussed HPI, physical exam and plan of care for this patient with attending Tanda RockersSamuel Gray. The attending  physician evaluated this patient as part of a shared visit and agrees  with plan of care.         Final Clinical Impression(s) / ED Diagnoses Final diagnoses:  Laceration of scalp, initial encounter  Anemia, unspecified type    Rx / DC Orders ED Discharge Orders     None         Theron Arista, Cordelia Poche 07/02/22 1158    Tilden Fossa, MD 07/02/22 2021

## 2022-07-02 NOTE — ED Notes (Signed)
Patient transported to CT 

## 2022-07-02 NOTE — ED Triage Notes (Signed)
Patient presents to ed via [facility] states patient had an unwitnessed fall hitting the back of her head , hematoma with bleeding, questionable loc. Patient currently at baseline. Patient is confused attempting to pull bandage off; mittens applied, attempted to get off stretcher her.

## 2022-07-02 NOTE — Discharge Instructions (Addendum)
She has 6 staples in her head which will need to be removed in the next 5 to 7 days.  Please follow-up with your primary.  Additionally patient was slightly anemic with a hemoglobin of 10.5 -please follow-up with primary for repeat laboratory work-up as indicated.  Return to the ED for additional symptoms or falls.  Patient is on multiple sedative agents, be aware that Ativan and trazodone can increase fall risk so use sparingly.  Please be aware there are some small abrasions on her scalp, these will likely bleed when you wash your hair.  Apply pressure and it will stop.

## 2022-07-02 NOTE — ED Notes (Signed)
Called [facility] for hospice transport. ETA coming now.

## 2022-12-01 ENCOUNTER — Emergency Department (HOSPITAL_COMMUNITY)

## 2022-12-01 ENCOUNTER — Emergency Department (HOSPITAL_COMMUNITY)
Admission: EM | Admit: 2022-12-01 | Discharge: 2022-12-01 | Disposition: A | Discharge status: Home / Self Care | Attending: Emergency Medicine | Admitting: Emergency Medicine

## 2022-12-01 DIAGNOSIS — I10 Essential (primary) hypertension: Secondary | ICD-10-CM | POA: Insufficient documentation

## 2022-12-01 DIAGNOSIS — S0990XA Unspecified injury of head, initial encounter: Secondary | ICD-10-CM | POA: Diagnosis present

## 2022-12-01 DIAGNOSIS — S0101XA Laceration without foreign body of scalp, initial encounter: Secondary | ICD-10-CM | POA: Insufficient documentation

## 2022-12-01 DIAGNOSIS — F0390 Unspecified dementia without behavioral disturbance: Secondary | ICD-10-CM | POA: Insufficient documentation

## 2022-12-01 DIAGNOSIS — W19XXXA Unspecified fall, initial encounter: Secondary | ICD-10-CM | POA: Insufficient documentation

## 2022-12-01 DIAGNOSIS — Z79899 Other long term (current) drug therapy: Secondary | ICD-10-CM | POA: Insufficient documentation

## 2022-12-01 MED ORDER — HALOPERIDOL LACTATE 5 MG/ML IJ SOLN
5.0000 mg | Freq: Once | INTRAMUSCULAR | Status: AC
  Administered 2022-12-01: 5 mg via INTRAVENOUS
  Filled 2022-12-01: qty 1

## 2022-12-01 NOTE — Discharge Instructions (Signed)
CT of the head did not show any bleeding today, but if Tina Fitzpatrick starts to have vomiting or change in her normal behavior she would need to return for repeat scan.  Tina Fitzpatrick was given haldol here for agitation so will be sleeping and a fall risk for the next 6-8 hours so she needs to be watched closely so she does not fall again.

## 2022-12-01 NOTE — ED Provider Notes (Signed)
[facility] Provider Note   CSN: [id number] Arrival date & time: 12/01/22  0703     History  Chief Complaint  Patient presents with   Fall/head lac    Tina Fitzpatrick is a 72 y.o. female.  Pt is a 72y/o female with a hx of HTN, HLD, dementia who is presenting today with EMS after a fall.  Staff heard the pt fall and then found her in her room and she was bleeding from the back of her head.  Staff states pt is at her baseline and she typically is walking and roaming around but when she is made to stay in the bed she becomes very agitated.  EMS reported they gave her 5 mg of Versed IM and transport which she did calm down some but here patient is very active in the bed and not happy about being restrained.  Staff reported bleeding on the back of her head.  Otherwise they denied any recent changes in her behavior.  She does have a history of occasional falls.  Last tetanus shot was last year.  The history is provided by the EMS personnel.       Home Medications Prior to Admission medications   Medication Sig Start Date End Date Taking? Authorizing Provider  ALPRAZolam (XANAX) 0.25 MG tablet TAKE 1 TABLET (0.25 MG TOTAL) BY MOUTH 2 (TWO) TIMES DAILY. Patient not taking: Reported on 06/21/2022 07/31/19   Copland, Gay Filler, MD  fenofibrate 160 MG tablet Take 1 tablet (160 mg total) by mouth daily. Patient not taking: Reported on 06/21/2022 12/11/19   Copland, Gay Filler, MD  gabapentin (NEURONTIN) 100 MG capsule Take 1 capsule (100 mg total) by mouth daily. Patient not taking: Reported on 06/21/2022 12/11/19   Copland, Gay Filler, MD  guaifenesin (ROBITUSSIN) 100 MG/5ML syrup Take 200 mg by mouth every 6 (six) hours as needed for cough.    [provider]  lisinopril (ZESTRIL) 5 MG tablet TAKE 1 TABLET BY MOUTH EVERY DAY Patient not taking: Reported on 06/21/2022 12/11/19   Copland, Gay Filler, MD  LORazepam (ATIVAN) 0.5 MG tablet Take 0.5 mg  by mouth 2 (two) times daily.    [provider]  magnesium hydroxide (MILK OF MAGNESIA) 400 MG/5ML suspension Take 30 mLs by mouth daily as needed for mild constipation.    [provider]  metoprolol tartrate (LOPRESSOR) 50 MG tablet TAKE 1 TABLET BY MOUTH TWICE A DAY Patient not taking: Reported on 06/21/2022 02/16/20   Copland, Gay Filler, MD  risperiDONE (RISPERDAL) 0.5 MG tablet Take 0.5 mg by mouth 2 (two) times daily.    [provider]  traZODone (DESYREL) 50 MG tablet Take 50 mg by mouth See admin instructions. Take 1 tablet by mouth twice daily and take 25mg  by mouth in the afternoon    [provider]      Allergies    Patient has no known allergies.    Review of Systems   Review of Systems  Physical Exam Updated Vital Signs BP 123/68 (BP Location: Right Arm)   Pulse 78   Temp 98 F (36.7 C) (Axillary)   Resp (!) 26   SpO2 98%  Physical Exam Vitals and nursing note reviewed.  Constitutional:      Appearance: She is well-developed.     Comments: Agitated constantly trying to sit up moving around in the bed trying to flail her arms and legs  HENT:  Head: Normocephalic.   Eyes:     Pupils: Pupils are equal, round, and reactive to light.  Cardiovascular:     Rate and Rhythm: Normal rate and regular rhythm.     Heart sounds: Normal heart sounds. No murmur heard.    No friction rub.  Pulmonary:     Effort: Pulmonary effort is normal.     Breath sounds: Normal breath sounds. No wheezing or rales.  Abdominal:     General: Bowel sounds are normal. There is no distension.     Palpations: Abdomen is soft.     Tenderness: There is no abdominal tenderness. There is no guarding or rebound.  Musculoskeletal:        General: No tenderness. Normal range of motion.     Comments: No edema  Skin:    General: Skin is warm and dry.     Findings: No rash.  Neurological:     Mental Status: She is alert.     Cranial Nerves: No cranial nerve  deficit.     Comments: Patient is awake and will intermittently answer questions.  Noted to move all extremities without difficulty  Psychiatric:     Comments: Agitated requiring restraints for her physical safety but can be report directed when talked with constantly     ED Results / Procedures / Treatments   Labs (all labs ordered are listed, but only abnormal results are displayed) Labs Reviewed - No data to display  EKG EKG Interpretation  Date/Time:  Tuesday December 01 2022 07:16:08 EDT Ventricular Rate:  84 PR Interval:  160 QRS Duration: 101 QT Interval:  368 QTC Calculation: 435 R Axis:   44 Text Interpretation: Sinus rhythm No significant change since last tracing Confirmed by Blanchie Dessert [id number]) on 12/01/2022 7:18:39 AM  Radiology CT Head Wo Contrast  Result Date: 12/01/2022 CLINICAL DATA:  Provided history: Head trauma, minor. Fall. Laceration. EXAM: CT HEAD WITHOUT CONTRAST TECHNIQUE: Contiguous axial images were obtained from the base of the skull through the vertex without intravenous contrast. RADIATION DOSE REDUCTION: This exam was performed according to the departmental dose-optimization program which includes automated exposure control, adjustment of the mA and/or kV according to patient size and/or use of iterative reconstruction technique. COMPARISON:  Head CT 07/02/2022. FINDINGS: Motion degraded exam. Additionally, portions of the posterior fossa and skull base are excluded from the field of view inferiorly. Within these limitations, findings are as follows. Brain: Generalized cerebral atrophy. Mild patchy and ill-defined hypoattenuation within the cerebral white matter, nonspecific but compatible with chronic small vessel disease. There is no acute intracranial hemorrhage. No demarcated cortical infarct. No extra-axial fluid collection. No evidence of an intracranial mass. No midline shift. Vascular: No hyperdense vessel. Skull: No fracture or aggressive osseous  lesion. Sinuses/Orbits: No mass or acute finding within the imaged orbits. No significant paranasal sinus disease at the imaged levels. Other: Left parietal scalp staples and hematoma. IMPRESSION: 1. Motion degraded exam. Additionally, portions of the posterior fossa and skull base are excluded from the field of view inferiorly. 2. Within these limitations, no acute intracranial abnormality is identified. 3. Left parietal scalp staples and hematoma. 4. Parenchymal atrophy and chronic small vessel disease. Electronically Signed   By: Kellie Simmering D.O.   On: 12/01/2022 08:18    Procedures Procedures  LACERATION REPAIR Performed by: [employer] Authorized by: Blanchie Dessert Consent: Verbal consent obtained. Risks and benefits: risks, benefits and alternatives were discussed Consent given by: patient Patient identity confirmed: provided demographic data Prepped  and Draped in normal sterile fashion Wound explored  Laceration Location: occipital scalp  Laceration Length: 3cm  No Foreign Bodies seen or palpated  Anesthesia: none Irrigation method: syringe Amount of cleaning: standard  Skin closure: staples  Number of sutures: 3  Technique: staples  Patient tolerance: Patient tolerated the procedure well with no immediate complications.    Medications Ordered in ED Medications  haloperidol lactate (HALDOL) injection 5 mg (5 mg Intravenous Given 12/01/22 0730)    ED Course/ Medical Decision Making/ A&P                             Medical Decision Making Amount and/or Complexity of Data Reviewed Independent Historian: EMS Radiology: ordered and independent interpretation performed. Decision-making details documented in ED Course.  Risk Prescription drug management.   Pt with multiple medical problems and comorbidities and presenting today with a complaint that caries a high risk for morbidity and mortality.  Here today after a fall at her facility which was  unwitnessed.  Patient is at her baseline at this time.  She is agitated and does not like being made to stay in the bed.  Vital signs are reassuring.  Laceration noted to the back of the head.  Does not take any anticoagulation and tetanus shot is up-to-date.  Patient required Haldol to get her calm enough to undergo CT scan.  I independently interpreted her EKG which shows no evidence of QT prolongation. I have independently visualized and interpreted pt's images today. CT of head neg for acute bleed but is some artifact however pt does not sit still and is agitated.  This was improved with haldol and pt is now resting.  At this time feel pt is stable for d/c.  Staple removal in 7-10 days        Final Clinical Impression(s) / ED Diagnoses Final diagnoses:  Fall, initial encounter  Scalp laceration, initial encounter    Rx / DC Orders ED Discharge Orders     None         Blanchie Dessert, MD 12/01/22 [phone]

## 2022-12-01 NOTE — ED Notes (Signed)
Multiple attempts made to give report to [facility]

## 2022-12-01 NOTE — ED Notes (Signed)
DC instructions provided to PTAR.  1 final attempt made to contact facility unsuccessfully. Pt DC.

## 2022-12-01 NOTE — ED Triage Notes (Signed)
PT BIB [facility] for an unwitnessed fall with a head laceratiion.  PT is from [facility] with advanced dementia.  She walks around the facility for hrs and becomes combative if not allowed to.  Per EMS the staff heard a noise and found her leaning against a wall bleeding from the back of her head.   BP was 96/54 on scene.  Pt was given 5 of versed and 500ML of NS, pt BP was 115/80 on arrival.

## 2022-12-06 ENCOUNTER — Emergency Department (HOSPITAL_COMMUNITY)

## 2022-12-06 ENCOUNTER — Encounter (HOSPITAL_COMMUNITY): Payer: Self-pay

## 2022-12-06 ENCOUNTER — Emergency Department (HOSPITAL_COMMUNITY)
Admission: EM | Admit: 2022-12-06 | Discharge: 2022-12-07 | Disposition: A | Discharge status: Skilled Nursing Facility | Attending: Emergency Medicine | Admitting: Emergency Medicine

## 2022-12-06 ENCOUNTER — Other Ambulatory Visit: Payer: Self-pay

## 2022-12-06 DIAGNOSIS — W01198A Fall on same level from slipping, tripping and stumbling with subsequent striking against other object, initial encounter: Secondary | ICD-10-CM | POA: Insufficient documentation

## 2022-12-06 DIAGNOSIS — W19XXXA Unspecified fall, initial encounter: Secondary | ICD-10-CM

## 2022-12-06 DIAGNOSIS — S0990XA Unspecified injury of head, initial encounter: Secondary | ICD-10-CM | POA: Diagnosis present

## 2022-12-06 DIAGNOSIS — S0003XA Contusion of scalp, initial encounter: Secondary | ICD-10-CM | POA: Insufficient documentation

## 2022-12-06 DIAGNOSIS — F0390 Unspecified dementia without behavioral disturbance: Secondary | ICD-10-CM | POA: Insufficient documentation

## 2022-12-06 DIAGNOSIS — Y92129 Unspecified place in nursing home as the place of occurrence of the external cause: Secondary | ICD-10-CM | POA: Insufficient documentation

## 2022-12-06 MED ORDER — MIDAZOLAM HCL 2 MG/2ML IJ SOLN
1.0000 mg | Freq: Once | INTRAMUSCULAR | Status: AC
  Administered 2022-12-06: 1 mg via INTRAMUSCULAR
  Filled 2022-12-06: qty 2

## 2022-12-06 MED ORDER — MIDAZOLAM HCL 2 MG/2ML IJ SOLN
2.0000 mg | Freq: Once | INTRAMUSCULAR | Status: AC
  Administered 2022-12-06: 2 mg via INTRAMUSCULAR
  Filled 2022-12-06: qty 2

## 2022-12-06 MED ORDER — OLANZAPINE 10 MG IM SOLR
10.0000 mg | Freq: Once | INTRAMUSCULAR | Status: AC
  Administered 2022-12-06: 10 mg via INTRAMUSCULAR
  Filled 2022-12-06 (×2): qty 10

## 2022-12-06 MED ORDER — HALOPERIDOL LACTATE 5 MG/ML IJ SOLN
5.0000 mg | Freq: Four times a day (QID) | INTRAMUSCULAR | Status: DC | PRN
  Administered 2022-12-06: 5 mg via INTRAMUSCULAR
  Filled 2022-12-06: qty 1

## 2022-12-06 MED ORDER — HALOPERIDOL LACTATE 5 MG/ML IJ SOLN
5.0000 mg | INTRAMUSCULAR | Status: AC
  Administered 2022-12-06: 5 mg via INTRAMUSCULAR
  Filled 2022-12-06: qty 1

## 2022-12-06 MED ORDER — DIPHENHYDRAMINE HCL 50 MG/ML IJ SOLN
25.0000 mg | Freq: Once | INTRAMUSCULAR | Status: AC
  Administered 2022-12-06: 25 mg via INTRAMUSCULAR
  Filled 2022-12-06: qty 1

## 2022-12-06 MED ORDER — LORAZEPAM 2 MG/ML IJ SOLN
1.0000 mg | Freq: Once | INTRAMUSCULAR | Status: AC
  Administered 2022-12-06: 1 mg via INTRAMUSCULAR
  Filled 2022-12-06: qty 1

## 2022-12-06 NOTE — ED Notes (Signed)
Sitter at beside. Patient calm and staying in the bed at this time. Still fidgeting with blankets and cords.

## 2022-12-06 NOTE — ED Notes (Signed)
Patient is laying still at this time. RN let CT know patient is ready for scan

## 2022-12-06 NOTE — ED Notes (Signed)
Patient changed out of soiled clothing and sheets.

## 2022-12-06 NOTE — Discharge Instructions (Signed)
Follow up with your doctor in the office.  

## 2022-12-06 NOTE — ED Notes (Signed)
Patient has on yellow arm band, no slip socks, and was placed on the posey alarm. Patient pulled the posey alarm out from under her and this RN placed it back under her.

## 2022-12-06 NOTE — ED Notes (Signed)
Ptar called pt to [facility] no ETA

## 2022-12-06 NOTE — ED Provider Notes (Signed)
[facility] Provider Note   CSN: [id number] Arrival date & time: 12/06/22  1826     History  Chief Complaint  Patient presents with   Lytle Michaels    Tina Fitzpatrick is a 72 y.o. female.  72 yo F with a chief complaints of a fall.  The patient was ambulating at her nursing facility and she lost her balance and fell and struck the back of her head.  She had recently fallen come to the hospital for something similar.  She was then encouraged to come to the hospital for evaluation.   Fall       Home Medications Prior to Admission medications   Medication Sig Start Date End Date Taking? Authorizing Provider  ALPRAZolam (XANAX) 0.25 MG tablet TAKE 1 TABLET (0.25 MG TOTAL) BY MOUTH 2 (TWO) TIMES DAILY. Patient not taking: Reported on 06/21/2022 07/31/19   Copland, Gay Filler, MD  fenofibrate 160 MG tablet Take 1 tablet (160 mg total) by mouth daily. Patient not taking: Reported on 06/21/2022 12/11/19   Copland, Gay Filler, MD  gabapentin (NEURONTIN) 100 MG capsule Take 1 capsule (100 mg total) by mouth daily. Patient not taking: Reported on 06/21/2022 12/11/19   Copland, Gay Filler, MD  guaifenesin (ROBITUSSIN) 100 MG/5ML syrup Take 200 mg by mouth every 6 (six) hours as needed for cough.    [provider]  lisinopril (ZESTRIL) 5 MG tablet TAKE 1 TABLET BY MOUTH EVERY DAY Patient not taking: Reported on 06/21/2022 12/11/19   Copland, Gay Filler, MD  LORazepam (ATIVAN) 0.5 MG tablet Take 0.5 mg by mouth 2 (two) times daily.    [provider]  magnesium hydroxide (MILK OF MAGNESIA) 400 MG/5ML suspension Take 30 mLs by mouth daily as needed for mild constipation.    [provider]  metoprolol tartrate (LOPRESSOR) 50 MG tablet TAKE 1 TABLET BY MOUTH TWICE A DAY Patient not taking: Reported on 06/21/2022 02/16/20   Copland, Gay Filler, MD  risperiDONE (RISPERDAL) 0.5 MG tablet Take 0.5 mg by mouth 2 (two) times daily.    [provider]  traZODone (DESYREL) 50 MG tablet Take 50 mg by mouth See admin instructions. Take 1 tablet by mouth twice daily and take 25mg  by mouth in the afternoon    [provider]      Allergies    Patient has no known allergies.    Review of Systems   Review of Systems  Physical Exam Updated Vital Signs BP (!) 134/94   Pulse (!) 110   Temp 98.2 F (36.8 C) (Oral)   Resp (!) 24   Ht 5\' 6"  (1.676 m)   Wt 76 kg   SpO2 97%   BMI 27.04 kg/m  Physical Exam Vitals and nursing note reviewed.  Constitutional:      General: She is not in acute distress.    Appearance: She is well-developed. She is not diaphoretic.  HENT:     Head: Normocephalic.     Comments: Left occipital hematoma.  There are staples already in place to the same area.  No obvious dehiscence of the wound. Eyes:     Pupils: Pupils are equal, round, and reactive to light.  Cardiovascular:     Rate and Rhythm: Normal rate and regular rhythm.     Heart sounds: No murmur heard.    No friction rub. No gallop.  Pulmonary:     Effort: Pulmonary effort is normal.  Breath sounds: No wheezing or rales.  Abdominal:     General: There is no distension.     Palpations: Abdomen is soft.     Tenderness: There is no abdominal tenderness.  Musculoskeletal:        General: No tenderness.     Cervical back: Normal range of motion and neck supple.  Skin:    General: Skin is warm and dry.  Neurological:     Mental Status: She is alert and oriented to person, place, and time.  Psychiatric:        Behavior: Behavior normal.     ED Results / Procedures / Treatments   Labs (all labs ordered are listed, but only abnormal results are displayed) Labs Reviewed - No data to display  EKG None  Radiology No results found.  Procedures .Critical Care  Performed by: Deno Etienne, DO Authorized by: Deno Etienne, DO   Critical care provider statement:    Critical care time (minutes):  35   Critical care time  was exclusive of:  Separately billable procedures and treating other patients   Critical care was time spent personally by me on the following activities:  Development of treatment plan with patient or surrogate, discussions with consultants, evaluation of patient's response to treatment, examination of patient, ordering and review of laboratory studies, ordering and review of radiographic studies, ordering and performing treatments and interventions, pulse oximetry, re-evaluation of patient's condition and review of old charts   Care discussed with: admitting provider       Medications Ordered in ED Medications  midazolam (VERSED) injection 1 mg (1 mg Intramuscular Given 12/06/22 1835)  haloperidol lactate (HALDOL) injection 5 mg (5 mg Intramuscular Given 12/06/22 1951)  diphenhydrAMINE (BENADRYL) injection 25 mg (25 mg Intramuscular Given 12/06/22 1952)  midazolam (VERSED) injection 2 mg (2 mg Intramuscular Given 12/06/22 1953)  OLANZapine (ZYPREXA) injection 10 mg (10 mg Intramuscular Given 12/06/22 2018)  LORazepam (ATIVAN) injection 1 mg (1 mg Intramuscular Given 12/06/22 2018)    ED Course/ Medical Decision Making/ A&P                             Medical Decision Making Amount and/or Complexity of Data Reviewed Radiology: ordered.  Risk Prescription drug management.   72 yo F with a chief complaints of a fall.  Patient has advanced dementia and she had unfortunately suffered a fall recently that required staples to her scalp.  Patient required a large amount of sedation to be able to comply with medical direction.  Awaiting CT scan of the head.  Patient care was signed out to Dr. Alvino Chapel, please see his note for further details care in the ED.  The patients results and plan were reviewed and discussed.   Any x-rays performed were independently reviewed by myself.   Differential diagnosis were considered with the presenting HPI.  Medications  midazolam (VERSED) injection 1 mg  (1 mg Intramuscular Given 12/06/22 1835)  haloperidol lactate (HALDOL) injection 5 mg (5 mg Intramuscular Given 12/06/22 1951)  diphenhydrAMINE (BENADRYL) injection 25 mg (25 mg Intramuscular Given 12/06/22 1952)  midazolam (VERSED) injection 2 mg (2 mg Intramuscular Given 12/06/22 1953)  OLANZapine (ZYPREXA) injection 10 mg (10 mg Intramuscular Given 12/06/22 2018)  LORazepam (ATIVAN) injection 1 mg (1 mg Intramuscular Given 12/06/22 2018)    Vitals:   12/06/22 1843 12/06/22 1845  BP: (!) 134/94 (!) 134/94  Pulse: (!) 102 (!) 110  Resp: Marland Kitchen)  32 (!) 24  Temp: 98.2 F (36.8 C)   TempSrc: Oral   SpO2: 99% 97%  Weight:  76 kg  Height:  5\' 6"  (1.676 m)    Final diagnoses:  Scalp hematoma, initial encounter    Admission/ observation were discussed with the admitting physician, patient and/or family and they are comfortable with the plan.          Final Clinical Impression(s) / ED Diagnoses Final diagnoses:  Scalp hematoma, initial encounter    Rx / DC Orders ED Discharge Orders     None         Deno Etienne, DO 12/06/22 2040

## 2022-12-06 NOTE — ED Notes (Signed)
Patient is still agitated. Scooting down in the bed saying she needs to get out of here. Patient in restraints, has had medication twice. See MAR. EDP is aware.

## 2022-12-06 NOTE — ED Triage Notes (Signed)
Patient bib [facility] from [facility] after she had a fall. She has a a C-collar, she has a laceration on the back of her head on top of the staples that were placed on 3/19 for her previous fall. Patient is not on blood thinners, and has dementia. She is at her normal baseline due to the dementia. On arrival to the ED she is attempting to get out of the bed.

## 2022-12-07 NOTE — ED Provider Notes (Signed)
  Physical Exam  BP 122/77   Pulse 72   Temp (!) 97.5 F (36.4 C) (Axillary)   Resp 15   Ht 5\' 6"  (1.676 m)   Wt 76 kg   SpO2 100%   BMI 27.04 kg/m   Physical Exam  Procedures  Procedures  ED Course / MDM    Medical Decision Making Amount and/or Complexity of Data Reviewed Radiology: ordered.  Risk Prescription drug management.   Received in signout.  Fall.  Head CT reassuring.  Does not appear to any closing of wound.  Mental status improving.  Discharge back to memory care.       Davonna Belling, MD 12/07/22 0030

## 2022-12-30 ENCOUNTER — Other Ambulatory Visit: Payer: Self-pay

## 2022-12-30 ENCOUNTER — Encounter (HOSPITAL_COMMUNITY): Payer: Self-pay

## 2022-12-30 ENCOUNTER — Emergency Department (HOSPITAL_COMMUNITY)

## 2022-12-30 ENCOUNTER — Emergency Department (HOSPITAL_COMMUNITY)
Admission: EM | Admit: 2022-12-30 | Discharge: 2022-12-30 | Disposition: A | Discharge status: Skilled Nursing Facility | Attending: Emergency Medicine | Admitting: Emergency Medicine

## 2022-12-30 DIAGNOSIS — Y92129 Unspecified place in nursing home as the place of occurrence of the external cause: Secondary | ICD-10-CM | POA: Insufficient documentation

## 2022-12-30 DIAGNOSIS — F0390 Unspecified dementia without behavioral disturbance: Secondary | ICD-10-CM | POA: Diagnosis not present

## 2022-12-30 DIAGNOSIS — S0101XA Laceration without foreign body of scalp, initial encounter: Secondary | ICD-10-CM | POA: Diagnosis not present

## 2022-12-30 DIAGNOSIS — W19XXXA Unspecified fall, initial encounter: Secondary | ICD-10-CM | POA: Insufficient documentation

## 2022-12-30 MED ORDER — CLONAZEPAM 0.5 MG PO TABS
0.5000 mg | ORAL_TABLET | Freq: Once | ORAL | Status: AC
  Administered 2022-12-30: 0.5 mg via ORAL
  Filled 2022-12-30: qty 1

## 2022-12-30 MED ORDER — RISPERIDONE 0.25 MG PO TABS
0.2500 mg | ORAL_TABLET | Freq: Once | ORAL | Status: AC
  Administered 2022-12-30: 0.25 mg via ORAL
  Filled 2022-12-30: qty 1

## 2022-12-30 NOTE — ED Notes (Signed)
Paperwork and discharge instructions given to PTAR for pt transfer back to [facility]

## 2022-12-30 NOTE — ED Notes (Signed)
Mittens placed on both hands to distract and prevent pt from grabbing side rails and cords

## 2022-12-30 NOTE — ED Notes (Signed)
Attempted to call report facility phone line hung up twice [phone]

## 2022-12-30 NOTE — Progress Notes (Signed)
Orthopedic Tech Progress Note Patient Details:  Tina Fitzpatrick 10/18/1950 [id number]  Patient ID: Tina Fitzpatrick, female   DOB: 1951/08/22, 72 y.o.   MRN: [id number] Level II; not currently needed. Darleen Crocker 12/30/2022, 9:13 PM

## 2022-12-30 NOTE — ED Notes (Signed)
Staples placed by provider to close laceration to back of head . Hair washed and cleaned PTAR called to transfer back to [facility]

## 2022-12-30 NOTE — ED Notes (Signed)
Patient pending PTAR return to [facility] carefacility

## 2022-12-30 NOTE — Discharge Instructions (Signed)
5 staples were applied to the back of her head.  Keep the area clean with soap and water.  Do not scrub near the staples so as to not remove them by accident.  Otherwise, follow-up with her primary care physician or the facility physician for staple removal in 5-7 days.

## 2022-12-30 NOTE — ED Provider Notes (Signed)
[facility] EMERGENCY DEPARTMENT AT [facility] Provider Note   CSN: [id number] Arrival date & time: 12/30/22  2049     History  Chief Complaint  Patient presents with   Fall    Arrived via [facility] from [facility] Continuity of care after unwitnessed fall with head injury    Tina Fitzpatrick is a 72 y.o. female.  HPI 72 year old female presents after an unwitnessed fall at her Nursing Facility. Has a history of dementia. Acting at baseline. Has a scalp hematoma/laceration. No blood thinners. No further history is available due to her significant history of dementia.   Home Medications Prior to Admission medications   Medication Sig Start Date End Date Taking? Authorizing Provider  ALPRAZolam (XANAX) 0.25 MG tablet TAKE 1 TABLET (0.25 MG TOTAL) BY MOUTH 2 (TWO) TIMES DAILY. Patient not taking: Reported on 06/21/2022 07/31/19   Copland, Gwenlyn Found, MD  fenofibrate 160 MG tablet Take 1 tablet (160 mg total) by mouth daily. Patient not taking: Reported on 06/21/2022 12/11/19   Copland, Gwenlyn Found, MD  gabapentin (NEURONTIN) 100 MG capsule Take 1 capsule (100 mg total) by mouth daily. Patient not taking: Reported on 06/21/2022 12/11/19   Copland, Gwenlyn Found, MD  guaifenesin (ROBITUSSIN) 100 MG/5ML syrup Take 200 mg by mouth every 6 (six) hours as needed for cough.    [provider]  lisinopril (ZESTRIL) 5 MG tablet TAKE 1 TABLET BY MOUTH EVERY DAY Patient not taking: Reported on 06/21/2022 12/11/19   Copland, Gwenlyn Found, MD  LORazepam (ATIVAN) 0.5 MG tablet Take 0.5 mg by mouth 2 (two) times daily.    [provider]  magnesium hydroxide (MILK OF MAGNESIA) 400 MG/5ML suspension Take 30 mLs by mouth daily as needed for mild constipation.    [provider]  metoprolol tartrate (LOPRESSOR) 50 MG tablet TAKE 1 TABLET BY MOUTH TWICE A DAY Patient not taking: Reported on 06/21/2022 02/16/20   Copland, Gwenlyn Found, MD  risperiDONE (RISPERDAL) 0.5 MG tablet Take 0.5 mg  by mouth 2 (two) times daily.    [provider]  traZODone (DESYREL) 50 MG tablet Take 50 mg by mouth See admin instructions. Take 1 tablet by mouth twice daily and take  by mouth in the afternoon    [provider]      Allergies    Patient has no known allergies.    Review of Systems   Review of Systems  Unable to perform ROS: Dementia    Physical Exam Updated Vital Signs BP 133/73   Pulse 66   Temp 98.5 F (36.9 C) (Oral)   Resp 18   Ht  (1.676 m)   Wt 59 kg   SpO2 96%   BMI 20.98 kg/m  Physical Exam Vitals and nursing note reviewed.  Constitutional:      Appearance: She is well-developed.  HENT:     Head: Normocephalic. Contusion and laceration present.   Cardiovascular:     Rate and Rhythm: Normal rate and regular rhythm.     Heart sounds: Normal heart sounds.  Pulmonary:     Effort: Pulmonary effort is normal.     Breath sounds: Normal breath sounds.  Abdominal:     General: There is no distension.     Palpations: Abdomen is soft.     Tenderness: There is no abdominal tenderness.  Musculoskeletal:     Cervical back: No spinous process tenderness or muscular tenderness.     Right hip: No tenderness.  Normal range of motion.     Left hip: No tenderness. Normal range of motion.  Skin:    General: Skin is warm and dry.  Neurological:     Mental Status: She is alert. She is disoriented.     Comments: Patient is disoriented/demented. She has equal strength in all 4 extremities     ED Results / Procedures / Treatments   Labs (all labs ordered are listed, but only abnormal results are displayed) Labs Reviewed - No data to display   EKG None  Radiology CT Head Wo Contrast  Result Date: 12/30/2022 CLINICAL DATA:  Head trauma EXAM: CT HEAD WITHOUT CONTRAST CT CERVICAL SPINE WITHOUT CONTRAST TECHNIQUE: Multidetector CT imaging of the head and cervical spine was performed following the standard protocol without intravenous contrast.  Multiplanar CT image reconstructions of the cervical spine were also generated. RADIATION DOSE REDUCTION: This exam was performed according to the departmental dose-optimization program which includes automated exposure control, adjustment of the mA and/or kV according to patient size and/or use of iterative reconstruction technique. COMPARISON:  12/06/2022 FINDINGS: CT HEAD FINDINGS Brain: There is no mass, hemorrhage or extra-axial collection. There is generalized atrophy without lobar predilection. There is hypoattenuation of the periventricular white matter, most commonly indicating chronic ischemic microangiopathy. Vascular: No abnormal hyperdensity of the major intracranial arteries or dural venous sinuses. No intracranial atherosclerosis. Skull: Left parietal scalp hematoma.  No skull fracture. Sinuses/Orbits: No fluid levels or advanced mucosal thickening of the visualized paranasal sinuses. No mastoid or middle ear effusion. The orbits are normal. CT CERVICAL SPINE FINDINGS Motion degraded study. Alignment: No static subluxation. Facets are aligned. Occipital condyles are normally positioned. Skull base and vertebrae: No acute fracture. Limited visualization of C5-7 hardware due to motion. Soft tissues and spinal canal: No prevertebral fluid or swelling. No visible canal hematoma. Disc levels: No advanced spinal canal or neural foraminal stenosis. Upper chest: No pneumothorax, pulmonary nodule or pleural effusion. Other: Normal visualized paraspinal cervical soft tissues. IMPRESSION: 1. No acute intracranial abnormality. 2. Left parietal scalp hematoma without skull fracture. 3. Motion degraded cervical spine examination with no visible acute fracture or static subluxation of the cervical spine. Electronically Signed   By: Deatra Robinson M.D.   On: 12/30/2022 21:58   CT Cervical Spine Wo Contrast  Result Date: 12/30/2022 CLINICAL DATA:  Head trauma EXAM: CT HEAD WITHOUT CONTRAST CT CERVICAL SPINE WITHOUT  CONTRAST TECHNIQUE: Multidetector CT imaging of the head and cervical spine was performed following the standard protocol without intravenous contrast. Multiplanar CT image reconstructions of the cervical spine were also generated. RADIATION DOSE REDUCTION: This exam was performed according to the departmental dose-optimization program which includes automated exposure control, adjustment of the mA and/or kV according to patient size and/or use of iterative reconstruction technique. COMPARISON:  12/06/2022 FINDINGS: CT HEAD FINDINGS Brain: There is no mass, hemorrhage or extra-axial collection. There is generalized atrophy without lobar predilection. There is hypoattenuation of the periventricular white matter, most commonly indicating chronic ischemic microangiopathy. Vascular: No abnormal hyperdensity of the major intracranial arteries or dural venous sinuses. No intracranial atherosclerosis. Skull: Left parietal scalp hematoma.  No skull fracture. Sinuses/Orbits: No fluid levels or advanced mucosal thickening of the visualized paranasal sinuses. No mastoid or middle ear effusion. The orbits are normal. CT CERVICAL SPINE FINDINGS Motion degraded study. Alignment: No static subluxation. Facets are aligned. Occipital condyles are normally positioned. Skull base and vertebrae: No acute fracture. Limited visualization of C5-7 hardware due to motion. Soft tissues and  spinal canal: No prevertebral fluid or swelling. No visible canal hematoma. Disc levels: No advanced spinal canal or neural foraminal stenosis. Upper chest: No pneumothorax, pulmonary nodule or pleural effusion. Other: Normal visualized paraspinal cervical soft tissues. IMPRESSION: 1. No acute intracranial abnormality. 2. Left parietal scalp hematoma without skull fracture. 3. Motion degraded cervical spine examination with no visible acute fracture or static subluxation of the cervical spine. Electronically Signed   By: Deatra Robinson M.D.   On: 12/30/2022  21:58    Procedures .Marland KitchenLaceration Repair  Date/Time: 12/30/2022 10:24 PM  Performed by: Pricilla Loveless, MD Authorized by: Pricilla Loveless, MD   Anesthesia:    Anesthesia method:  None Laceration details:    Location:  Scalp   Scalp location:  Occipital   Length (cm):  4 Exploration:    Limited defect created (wound extended): no     Hemostasis achieved with:  Direct pressure   Imaging outcome: foreign body not noted   Skin repair:    Repair method:  Staples   Number of staples:  5 Approximation:    Approximation:  Close Repair type:    Repair type:  Simple Post-procedure details:    Dressing:  Open (no dressing)   Procedure completion:  Tolerated well, no immediate complications     Medications Ordered in ED Medications  clonazePAM (KLONOPIN) tablet 0.5 mg (0.5 mg Oral Given 12/30/22 2117)  risperiDONE (RISPERDAL) tablet 0.25 mg (0.25 mg Oral Given 12/30/22 2117)    ED Course/ Medical Decision Making/ A&P Clinical Course as of 12/30/22 2225  Wed Dec 30, 2022  2136 I discussed with staff at [facility].  Given she is in hospice, she would not want any type of workup to explain why she falls as she has been having falls from her dementia.  CT scan would be okay as well as treatment of the laceration but that was her primary goal and sending to the ER. Will cancel labs. [SG]    Clinical Course User Index [SG] Pricilla Loveless, MD                             Medical Decision Making Amount and/or Complexity of Data Reviewed External Data Reviewed: notes. Labs: ordered. Radiology: ordered and independent interpretation performed.    Details: Patient's head CT and C-spine CT show no obvious head bleed or fracture  Risk Prescription drug management.   Patient has had multiple falls before, often resulting in similar presentations.  As above I discussed with her hospice caregivers and they recommend no labs or invasive workup.  CT head shows no head bleed.  Staples were  applied.  Bleeding seems controlled.  Updated the husband, Hedda Crumbley, over the phone.  Will send her back to her facility.        Final Clinical Impression(s) / ED Diagnoses Final diagnoses:  Laceration of scalp, initial encounter    Rx / DC Orders ED Discharge Orders     None         Pricilla Loveless, MD 12/30/22 2227

## 2022-12-30 NOTE — ED Notes (Signed)
Patient taken to CT. Ct staff notified of dementia and inabilty to follow directions and fall risk

## 2022-12-30 NOTE — ED Notes (Signed)
Pt arrived via [facility] from [place] continuity of care facilty after unwitnessed fall. Per staff pt was on floor at least 30 minutes prior to bein g found. Pt has hematoma and blodd to back of skull. Patient has dementia and is unable to communicate hx of what happened. Pt has DNR form. High risk fall precautions initiated to include socks sign arm band bed alarm bed locked in lowest position possible

## 2023-06-15 ENCOUNTER — Emergency Department (HOSPITAL_COMMUNITY)
Admission: EM | Admit: 2023-06-15 | Discharge: 2023-06-15 | Disposition: A | Discharge status: Home / Self Care | Attending: Emergency Medicine | Admitting: Emergency Medicine

## 2023-06-15 ENCOUNTER — Emergency Department (HOSPITAL_COMMUNITY)

## 2023-06-15 ENCOUNTER — Other Ambulatory Visit: Payer: Self-pay

## 2023-06-15 DIAGNOSIS — R451 Restlessness and agitation: Secondary | ICD-10-CM | POA: Insufficient documentation

## 2023-06-15 DIAGNOSIS — S0990XA Unspecified injury of head, initial encounter: Secondary | ICD-10-CM | POA: Diagnosis present

## 2023-06-15 DIAGNOSIS — I6782 Cerebral ischemia: Secondary | ICD-10-CM | POA: Diagnosis not present

## 2023-06-15 DIAGNOSIS — W19XXXA Unspecified fall, initial encounter: Secondary | ICD-10-CM | POA: Diagnosis not present

## 2023-06-15 DIAGNOSIS — Y92129 Unspecified place in nursing home as the place of occurrence of the external cause: Secondary | ICD-10-CM | POA: Diagnosis not present

## 2023-06-15 DIAGNOSIS — R945 Abnormal results of liver function studies: Secondary | ICD-10-CM | POA: Insufficient documentation

## 2023-06-15 DIAGNOSIS — S0101XA Laceration without foreign body of scalp, initial encounter: Secondary | ICD-10-CM | POA: Diagnosis not present

## 2023-06-15 DIAGNOSIS — G319 Degenerative disease of nervous system, unspecified: Secondary | ICD-10-CM | POA: Insufficient documentation

## 2023-06-15 LAB — CBC WITH DIFFERENTIAL/PLATELET
Abs Immature Granulocytes: 0.02 10*3/uL (ref 0.00–0.07)
Basophils Absolute: 0.1 10*3/uL (ref 0.0–0.1)
Basophils Relative: 1 %
Eosinophils Absolute: 0.1 10*3/uL (ref 0.0–0.5)
Eosinophils Relative: 1 %
HCT: 37.9 % (ref 36.0–46.0)
Hemoglobin: 12.4 g/dL (ref 12.0–15.0)
Immature Granulocytes: 0 %
Lymphocytes Relative: 25 %
Lymphs Abs: 1.6 10*3/uL (ref 0.7–4.0)
MCH: 31.2 pg (ref 26.0–34.0)
MCHC: 32.7 g/dL (ref 30.0–36.0)
MCV: 95.5 fL (ref 80.0–100.0)
Monocytes Absolute: 0.4 10*3/uL (ref 0.1–1.0)
Monocytes Relative: 7 %
Neutro Abs: 4.1 10*3/uL (ref 1.7–7.7)
Neutrophils Relative %: 66 %
Platelets: 218 10*3/uL (ref 150–400)
RBC: 3.97 MIL/uL (ref 3.87–5.11)
RDW: 12.8 % (ref 11.5–15.5)
WBC: 6.3 10*3/uL (ref 4.0–10.5)
nRBC: 0 % (ref 0.0–0.2)

## 2023-06-15 LAB — COMPREHENSIVE METABOLIC PANEL
ALT: 17 U/L (ref 0–44)
AST: 22 U/L (ref 15–41)
Albumin: 3.7 g/dL (ref 3.5–5.0)
Alkaline Phosphatase: 49 U/L (ref 38–126)
Anion gap: 9 (ref 5–15)
BUN: 12 mg/dL (ref 8–23)
CO2: 22 mmol/L (ref 22–32)
Calcium: 8.1 mg/dL — ABNORMAL LOW (ref 8.9–10.3)
Chloride: 104 mmol/L (ref 98–111)
Creatinine, Ser: 0.35 mg/dL — ABNORMAL LOW (ref 0.44–1.00)
GFR, Estimated: >60 mL/min (ref >60)
Glucose, Bld: 85 mg/dL (ref 70–99)
Potassium: 3.2 mmol/L — ABNORMAL LOW (ref 3.5–5.1)
Sodium: 135 mmol/L (ref 135–145)
Total Bilirubin: 0.8 mg/dL (ref 0.3–1.2)
Total Protein: 6.3 g/dL — ABNORMAL LOW (ref 6.5–8.1)

## 2023-06-15 LAB — URINALYSIS, ROUTINE W REFLEX MICROSCOPIC
Bilirubin Urine: NEGATIVE
Glucose, UA: NEGATIVE mg/dL
Hgb urine dipstick: NEGATIVE
Ketones, ur: NEGATIVE mg/dL
Leukocytes,Ua: NEGATIVE
Nitrite: NEGATIVE
Protein, ur: NEGATIVE mg/dL
Specific Gravity, Urine: 1.009 (ref 1.005–1.030)
pH: 8 (ref 5.0–8.0)

## 2023-06-15 LAB — RAPID HIV SCREEN (HIV 1/2 AB+AG)
HIV 1/2 Antibodies: NONREACTIVE
HIV-1 P24 Antigen - HIV24: NONREACTIVE

## 2023-06-15 LAB — HEPATITIS PANEL, ACUTE
HCV Ab: NONREACTIVE
Hep A IgM: NONREACTIVE
Hep B C IgM: NONREACTIVE
Hepatitis B Surface Ag: NONREACTIVE

## 2023-06-15 MED ORDER — PROPRANOLOL HCL 20 MG PO TABS: 10.0000 mg | ORAL_TABLET | Freq: Two times a day (BID) | ORAL | Status: DC

## 2023-06-15 MED ORDER — HALOPERIDOL LACTATE 5 MG/ML IJ SOLN
2.0000 mg | Freq: Once | INTRAMUSCULAR | Status: AC
  Administered 2023-06-15: 2 mg via INTRAVENOUS

## 2023-06-15 MED ORDER — RISPERIDONE 0.5 MG PO TABS: 0.5000 mg | ORAL_TABLET | Freq: Two times a day (BID) | ORAL | Status: DC

## 2023-06-15 MED ORDER — LORAZEPAM 2 MG/ML IJ SOLN
0.5000 mg | Freq: Once | INTRAMUSCULAR | Status: AC
  Administered 2023-06-15: 0.5 mg via INTRAVENOUS
  Filled 2023-06-15: qty 1

## 2023-06-15 MED ORDER — TRAZODONE HCL 100 MG PO TABS: 50.0000 mg | ORAL_TABLET | ORAL | Status: DC

## 2023-06-15 MED ORDER — TRAZODONE HCL 100 MG PO TABS: 50.0000 mg | ORAL_TABLET | Freq: Every day | ORAL | Status: DC

## 2023-06-15 MED ORDER — HALOPERIDOL LACTATE 5 MG/ML IJ SOLN
2.0000 mg | Freq: Once | INTRAMUSCULAR | Status: AC
  Administered 2023-06-15: 2 mg via INTRAVENOUS
  Filled 2023-06-15: qty 1

## 2023-06-15 MED ORDER — POTASSIUM CHLORIDE CRYS ER 20 MEQ PO TBCR
40.0000 meq | EXTENDED_RELEASE_TABLET | Freq: Once | ORAL | Status: DC
  Filled 2023-06-15: qty 2

## 2023-06-15 MED ORDER — TRAZODONE HCL 100 MG PO TABS: 100.0000 mg | ORAL_TABLET | Freq: Every day | ORAL | Status: DC

## 2023-06-15 MED ORDER — TRAZODONE HCL 50 MG PO TABS: 25.0000 mg | ORAL_TABLET | ORAL | Status: DC

## 2023-06-15 MED ORDER — LACTATED RINGERS IV SOLN: INTRAVENOUS | Status: DC

## 2023-06-15 MED ORDER — LACTATED RINGERS IV BOLUS
1000.0000 mL | Freq: Once | INTRAVENOUS | Status: AC
  Administered 2023-06-15: 1000 mL via INTRAVENOUS

## 2023-06-15 MED ORDER — HALOPERIDOL LACTATE 5 MG/ML IJ SOLN
5.0000 mg | Freq: Once | INTRAMUSCULAR | Status: DC
  Filled 2023-06-15: qty 1

## 2023-06-15 MED ORDER — LORAZEPAM 2 MG/ML IJ SOLN
1.0000 mg | Freq: Once | INTRAMUSCULAR | Status: DC
  Filled 2023-06-15: qty 1

## 2023-06-15 NOTE — ED Provider Notes (Signed)
[place] EMERGENCY DEPARTMENT AT [facility] Provider Note   CSN: [id number] Arrival date & time: 06/15/23  0008     History  Chief Complaint  Patient presents with   Marletta Lor    Tina Fitzpatrick is a 72 y.o. female.  Patient presents to the emergency department for evaluation after a fall.  Patient sent to the ED by ambulance after a fall at the nursing home.  Patient with laceration to the posterior aspect of her scalp.  EMS reports that the patient was agitated and combative for them, administered Versed and Haldol.  At arrival she is calm.       Home Medications Prior to Admission medications   Medication Sig Start Date End Date Taking? Authorizing Provider  ALPRAZolam (XANAX) 0.25 MG tablet TAKE 1 TABLET (0.25 MG TOTAL) BY MOUTH 2 (TWO) TIMES DAILY. Patient not taking: Reported on 06/21/2022 07/31/19   Copland, Gwenlyn Found, MD  fenofibrate 160 MG tablet Take 1 tablet (160 mg total) by mouth daily. Patient not taking: Reported on 06/21/2022 12/11/19   Copland, Gwenlyn Found, MD  gabapentin (NEURONTIN) 100 MG capsule Take 1 capsule (100 mg total) by mouth daily. Patient not taking: Reported on 06/21/2022 12/11/19   Copland, Gwenlyn Found, MD  guaifenesin (ROBITUSSIN) 100 MG/5ML syrup Take 200 mg by mouth every 6 (six) hours as needed for cough.    [provider]  lisinopril (ZESTRIL) 5 MG tablet TAKE 1 TABLET BY MOUTH EVERY DAY Patient not taking: Reported on 06/21/2022 12/11/19   Copland, Gwenlyn Found, MD  LORazepam (ATIVAN) 0.5 MG tablet Take 0.5 mg by mouth 2 (two) times daily.    [provider]  magnesium hydroxide (MILK OF MAGNESIA) 400 MG/5ML suspension Take 30 mLs by mouth daily as needed for mild constipation.    [provider]  metoprolol tartrate (LOPRESSOR) 50 MG tablet TAKE 1 TABLET BY MOUTH TWICE A DAY Patient not taking: Reported on 06/21/2022 02/16/20   Copland, Gwenlyn Found, MD  risperiDONE (RISPERDAL) 0.5 MG tablet Take 0.5 mg by mouth 2 (two)  times daily.    [provider]  traZODone (DESYREL) 50 MG tablet Take 50 mg by mouth See admin instructions. Take 1 tablet by mouth twice daily and take 25mg  by mouth in the afternoon    [provider]      Allergies    Patient has no known allergies.    Review of Systems   Review of Systems  Physical Exam Updated Vital Signs BP (!) 148/92   Pulse 84   Temp 97.9 F (36.6 C) (Oral)   Resp 18   Ht 5\' 6"  (1.676 m)   Wt 59 kg   SpO2 100%   BMI 20.99 kg/m  Physical Exam Vitals and nursing note reviewed.  Constitutional:      General: She is not in acute distress.    Appearance: She is well-developed.  HENT:     Head: Normocephalic. Laceration (Posterior scalp) present.      Mouth/Throat:     Mouth: Mucous membranes are moist.  Eyes:     General: Vision grossly intact. Gaze aligned appropriately.     Extraocular Movements: Extraocular movements intact.     Conjunctiva/sclera: Conjunctivae normal.  Cardiovascular:     Rate and Rhythm: Normal rate and regular rhythm.     Pulses: Normal pulses.     Heart sounds: Normal heart sounds, S1 normal and S2 normal. No murmur heard.    No friction rub. No  gallop.  Pulmonary:     Effort: Pulmonary effort is normal. No respiratory distress.     Breath sounds: Normal breath sounds.  Abdominal:     General: Bowel sounds are normal.     Palpations: Abdomen is soft.     Tenderness: There is no abdominal tenderness. There is no guarding or rebound.     Hernia: No hernia is present.  Musculoskeletal:        General: No swelling.     Cervical back: Full passive range of motion without pain, normal range of motion and neck supple. No spinous process tenderness or muscular tenderness. Normal range of motion.     Right lower leg: No edema.     Left lower leg: No edema.  Skin:    General: Skin is warm and dry.     Capillary Refill: Capillary refill takes less than 2 seconds.     Findings: No ecchymosis, erythema, rash or  wound.  Neurological:     General: No focal deficit present.     Mental Status: She is alert and oriented to person, place, and time.     GCS: GCS eye subscore is 4. GCS verbal subscore is 5. GCS motor subscore is 6.     Cranial Nerves: Cranial nerves 2-12 are intact.     Sensory: Sensation is intact.     Motor: Motor function is intact.     Coordination: Coordination is intact.  Psychiatric:        Attention and Perception: Attention normal.        Mood and Affect: Mood normal.        Speech: Speech normal.        Behavior: Behavior normal.     ED Results / Procedures / Treatments   Labs (all labs ordered are listed, but only abnormal results are displayed) Labs Reviewed  CBC WITH DIFFERENTIAL/PLATELET  URINALYSIS, ROUTINE W REFLEX MICROSCOPIC  COMPREHENSIVE METABOLIC PANEL    EKG None  Radiology CT CERVICAL SPINE WO CONTRAST  Result Date: 06/15/2023 CLINICAL DATA:  Neck trauma (Age >= 65y).  Fall. EXAM: CT CERVICAL SPINE WITHOUT CONTRAST TECHNIQUE: Multidetector CT imaging of the cervical spine was performed without intravenous contrast. Multiplanar CT image reconstructions were also generated. RADIATION DOSE REDUCTION: This exam was performed according to the departmental dose-optimization program which includes automated exposure control, adjustment of the mA and/or kV according to patient size and/or use of iterative reconstruction technique. COMPARISON:  12/30/2022 FINDINGS: Alignment: No subluxation. Skull base and vertebrae: No acute fracture. No primary bone lesion or focal pathologic process. Soft tissues and spinal canal: No prevertebral fluid or swelling. No visible canal hematoma. Disc levels: Prior ACDF C5-C7. Diffuse degenerative disc and facet disease. Upper chest: No acute findings Other: None IMPRESSION: Degenerative and postoperative changes.  No acute bony abnormality. Electronically Signed   By: Charlett Nose M.D.   On: 06/15/2023 01:28   CT HEAD WO CONTRAST  ( )  Result Date: 06/15/2023 CLINICAL DATA:  Head trauma, minor (Age >= 65y).  Fall. EXAM: CT HEAD WITHOUT CONTRAST TECHNIQUE: Contiguous axial images were obtained from the base of the skull through the vertex without intravenous contrast. RADIATION DOSE REDUCTION: This exam was performed according to the departmental dose-optimization program which includes automated exposure control, adjustment of the mA and/or kV according to patient size and/or use of iterative reconstruction technique. COMPARISON:  12/30/2022 FINDINGS: Brain: There is atrophy and chronic small vessel disease changes. No acute intracranial abnormality. Specifically, no hemorrhage, hydrocephalus,  mass lesion, acute infarction, or significant intracranial injury. Vascular: No hyperdense vessel or unexpected calcification. Skull: No acute calvarial abnormality. Sinuses/Orbits: No acute findings Other: Soft tissue swelling in the posterior scalp. IMPRESSION: Atrophy, chronic microvascular disease. No acute intracranial abnormality. Electronically Signed   By: Charlett Nose M.D.   On: 06/15/2023 01:26    Procedures .Marland KitchenLaceration Repair  Date/Time: 06/15/2023 6:37 AM  Performed by: Gilda Crease, MD Authorized by: Gilda Crease, MD   Consent:    Consent obtained:  Emergent situation Universal protocol:    Site/side marked: yes     Immediately prior to procedure, a time out was called: yes     Patient identity confirmed:  Hospital-assigned identification number Anesthesia:    Anesthesia method:  None Laceration details:    Location:  Scalp   Scalp location:  Occipital   Length (cm):  2 Pre-procedure details:    Preparation:  Patient was prepped and draped in usual sterile fashion and imaging obtained to evaluate for foreign bodies Treatment:    Area cleansed with:  Chlorhexidine   Irrigation solution:  Sterile saline   Debridement:  None Skin repair:    Repair method:  Staples   Number of staples:   3 Approximation:    Approximation:  Close Repair type:    Repair type:  Simple Post-procedure details:    Dressing:  Open (no dressing)   Procedure completion:  Tolerated well, no immediate complications     Medications Ordered in ED Medications  haloperidol lactate (HALDOL) injection 5 mg (0 mg Intramuscular Hold 06/15/23 7829)    ED Course/ Medical Decision Making/ A&P                                 Medical Decision Making Amount and/or Complexity of Data Reviewed Labs: ordered. Radiology: ordered.  Risk Prescription drug management.   Patient presents after a fall.  Patient moving all extremities, appears comfortable at arrival.  She had received Haldol and Versed for sedation due to agitation on scene.  Patient underwent CT head and cervical spine which did not show any acute abnormality.  Laceration on the posterior scalp was stapled.  Patient has become awake and alert after her sedation wore off and she is now very agitated requiring restraints.  Will redose Haldol, perform basic workup.  She may need psychiatric evaluation if she cannot be safely transported back to the nursing home.  Will sign to oncoming ER physician.        Final Clinical Impression(s) / ED Diagnoses Final diagnoses:  Fall, initial encounter  Laceration of occipital scalp, initial encounter  Agitation    Rx / DC Orders ED Discharge Orders     None         Oriya Kettering, Canary Brim, MD 06/15/23 [phone]

## 2023-06-15 NOTE — Discharge Instructions (Signed)
Have staples removed in 7 days

## 2023-06-15 NOTE — ED Provider Notes (Signed)
Patient signed to me by prior provider.  Patient at baseline has dementia.  Nurses spoke with nursing home and patient at baseline likes to wander.  Was given Haldol here is doing much better at this time.  Will discharge back to her facility   Lorre Nick, MD 06/15/23 1232

## 2023-06-15 NOTE — ED Provider Notes (Signed)
72 yo female presenting with a fall History of dementia Intermittent tachycardia in setting of agitation, given sedative medications earlier Pending [facility] transport back to nursing facility    Terald Sleeper, MD 06/16/23 [phone]

## 2023-06-15 NOTE — ED Notes (Signed)
[facility] was called to give report.

## 2023-06-15 NOTE — ED Triage Notes (Signed)
Pt in c-collar on arrival

## 2023-06-15 NOTE — ED Notes (Signed)
Pt in non-violent restraints upon my taking this pt this morning.  I have checked pt and placed on the heart monitor and pulse ox and started bp.  Pt will not answer any questions verbal and only shakes her head.  Unsure if she comprehends my questions.

## 2023-06-15 NOTE — ED Triage Notes (Addendum)
Pt BIB EMS for fall at her facility, pt is from [facility]. The fall was witnessed, no LOC and no thinners. Pt hit the back of her head. Pt is bleeding on posterior side of head and there is a knot on the posterior side of the head. 2.5 mg haldol and 5 mg midazolam IM given en route for agitation.

## 2023-06-15 NOTE — ED Notes (Addendum)
Transport has arrived for pt, pt at baseline, pt appears to be in NAD at this time. Report called by previous RN

## 2023-06-15 NOTE — ED Notes (Signed)
[facility] called for pt transport. 

## 2023-06-15 NOTE — ED Notes (Signed)
Spoke with Geni Bers, at [facility]. Pt is at baseline at this time. Pt has dementia and does not communicate and is disorientedx4.

## 2023-06-15 NOTE — ED Notes (Signed)
Checked pt and she is now on pulse ox and vitals stable

## 2023-06-15 NOTE — ED Notes (Signed)
Pt getting out of bed after being told to lay back down. Pt became agitated and tried to leave the room.

## 2023-06-15 NOTE — ED Notes (Signed)
Pt becoming way more active this afternoon, pt is still in soft restraints attempting to get up, pt been fighting it for a few hours now and heart rate shot to 140 but  talked to pt and informed her to calm down and that she was okay, pt heart rate is now at 128, will continue to monitor.

## 2023-06-15 NOTE — ED Notes (Signed)
Pt placed in bilateral restraints after consistently trying to get out of bed. Pt tried to be reoriented with no success at this time.

## 2023-06-15 NOTE — Progress Notes (Signed)
[facility] liaison note     This patient is a current hospice patient with [facility].    Liaison will continue to follow for any discharge planning needs and to coordinate continuation of hospice care.    Please don't hesitate to call with any Hospice related questions or concerns.    Thank you for the opportunity to participate in this patient's care.  Glenna Fellows, BSN, RN, OCN [employer] [phone]

## 2023-06-15 NOTE — ED Notes (Signed)
Got order for iv fluids due to unable to get urine

## 2023-06-15 NOTE — ED Notes (Addendum)
I went to change pt, I encourage pt to eat, pt asked for apple sauce, pt ate all apple sauce. Pt also asked me for apple juice, proved pt with apple juice and she drank it all after not eating and drinking all day.

## 2023-06-15 NOTE — ED Notes (Signed)
Pt has not eaten all day, I assisted and tried feeding apple apple sauce, pt ate one bite and said "no more"

## 2023-06-20 ENCOUNTER — Emergency Department (HOSPITAL_COMMUNITY)
Admission: EM | Admit: 2023-06-20 | Discharge: 2023-06-20 | Disposition: A | Discharge status: Home / Self Care | Attending: Emergency Medicine | Admitting: Emergency Medicine

## 2023-06-20 ENCOUNTER — Encounter (HOSPITAL_COMMUNITY): Payer: Self-pay

## 2023-06-20 DIAGNOSIS — I10 Essential (primary) hypertension: Secondary | ICD-10-CM | POA: Insufficient documentation

## 2023-06-20 DIAGNOSIS — Z79899 Other long term (current) drug therapy: Secondary | ICD-10-CM | POA: Insufficient documentation

## 2023-06-20 DIAGNOSIS — F0390 Unspecified dementia without behavioral disturbance: Secondary | ICD-10-CM | POA: Diagnosis not present

## 2023-06-20 DIAGNOSIS — Z5189 Encounter for other specified aftercare: Secondary | ICD-10-CM

## 2023-06-20 DIAGNOSIS — Z4800 Encounter for change or removal of nonsurgical wound dressing: Secondary | ICD-10-CM | POA: Diagnosis not present

## 2023-06-20 DIAGNOSIS — Z4802 Encounter for removal of sutures: Secondary | ICD-10-CM | POA: Diagnosis present

## 2023-06-20 MED ORDER — CEPHALEXIN 500 MG PO CAPS: 500.0000 mg | ORAL_CAPSULE | Freq: Three times a day (TID) | ORAL | 0 refills | Status: DC

## 2023-06-20 NOTE — ED Triage Notes (Signed)
Pt was BIB [facility] from [facility] d/t a wound on left side of head - pt needed staples removed.  Dr Preston Fleeting called to [facility] to remove staples d/t pt combative.

## 2023-06-20 NOTE — ED Notes (Signed)
Called [facility] to transport patient to [facility].  °

## 2023-06-20 NOTE — ED Provider Notes (Signed)
[name] Provider Note   CSN: [id number] Arrival date & time: 06/20/23  0037     History  Chief Complaint  Patient presents with   Wound Check    Tina Fitzpatrick is a 72 y.o. female.  The history is provided by the nursing home. The history is limited by the condition of the patient (Dementia).  Wound Check  She has history of hypertension, hyperlipidemia, dementia and was in the emergency department recently for a fall with scalp laceration and placement of staples.  Apparently, she has been picking at the staples and the wound has been bleeding and facility wanted it reevaluated.  Patient is not able to give any history.   Home Medications Prior to Admission medications   Medication Sig Start Date End Date Taking? Authorizing Provider  cephALEXin (KEFLEX) 500 MG capsule Take 1 capsule (500 mg total) by mouth 3 (three) times daily. 06/20/23  Yes Dione Booze, MD  acetaminophen (TYLENOL) 500 MG tablet Take 500 mg by mouth in the morning, at noon, and at bedtime.    [provider]  clonazePAM (KLONOPIN) 0.5 MG tablet Take 0.5 mg by mouth daily as needed (agitation).    [provider]  gabapentin (NEURONTIN) 400 MG capsule Take 400 mg by mouth 3 (three) times daily.    [provider]  hydrocortisone cream 1 % Apply 1 Application topically 2 (two) times daily. For 10 days    [provider]  ipratropium-albuterol (DUONEB) 0.5-2.5 (3) MG/3ML SOLN Take 3 mLs by nebulization every 6 (six) hours as needed (wheezing/SOB).    [provider]  Petrolatum-Zinc Oxide 57-17 % PSTE Apply 1 Application topically See admin instructions. 1 application to buttocks and perineal area with each incontinent episode on every shift    [provider]  propranolol (INDERAL) 10 MG tablet Take 10 mg by mouth 2 (two) times daily. Hold for pulse under 55    [provider]  traZODone (DESYREL) 100 MG  tablet Take 100 mg by mouth at bedtime.    [provider]  traZODone (DESYREL) 50 MG tablet Take 25-50 mg by mouth See admin instructions. Take 50 mg by mouth every morning and 25 mg by mouth every afternoon.    [provider]  UNABLE TO FIND Take 2 Doses by mouth in the morning, at noon, and at bedtime. Med Name: [facility]    [provider]      Allergies    Patient has no known allergies.    Review of Systems   Review of Systems  Unable to perform ROS: Dementia    Physical Exam Updated Vital Signs BP (!) 140/63 (BP Location: Right Arm)   Pulse 60   Temp (!) 97.5 F (36.4 C) (Axillary)   Resp (!) 22   Ht 5\' 6"  (1.676 m)   Wt 59 kg   SpO2 100%   BMI 20.99 kg/m  Physical Exam Vitals and nursing note reviewed.   72 year old female, resting comfortably and in no acute distress. Vital signs are normal. Oxygen saturation is 100%, which is normal. Head is normocephalic.  Wound is present on the occipital scalp with 3 staples still in place.  Slight purulent drainage is noted.  Wound appears to be adequately close to where it is safe to remove staples. Neck is nontender and supple. Lungs are clear without rales, wheezes, or rhonchi. Chest is nontender. Heart has regular rate and rhythm without  murmur. Neurologic: Awake but agitated and combative, moves all extremities equally.  ED Results / Procedures / Treatments    Procedures .Suture Removal  Date/Time: 06/20/2023 12:49 AM  Performed by: Dione Booze, MD Authorized by: Dione Booze, MD   Consent:    Consent obtained: Implied consent. Universal protocol:    Procedure explained and questions answered to patient or proxy's satisfaction: yes     Relevant documents present and verified: yes     Required blood products, implants, devices, and special equipment available: yes     Site/side marked: yes     Immediately prior to procedure, a time out was called: yes     Patient identity confirmed:   Hospital-assigned identification number and arm band Location:    Location:  Head/neck   Head/neck location:  Scalp Procedure details:    Wound appearance:  Purulent   Number of staples removed:  3 Post-procedure details:    Post-removal:  Dressing applied   Procedure completion:  Tolerated well, no immediate complications     Medications Ordered in ED Medications - No data to display  ED Course/ Medical Decision Making/ A&P                                 Medical Decision Making Risk Prescription drug management.   Stapled wound with concern for early infection.  Also, patient is constantly picking at the staples and I feel that she will have better wound healing with removal of the staples.  Therefore, I remove the staples.  Because of some purulent drainage noted, I am discharging her with a prescription for cephalexin.  I have reviewed her past records and note that staples were placed in an ED visit on 06/15/2023.  Final Clinical Impression(s) / ED Diagnoses Final diagnoses:  Encounter for wound re-check  Removal of staples    Rx / DC Orders ED Discharge Orders          Ordered    cephALEXin (KEFLEX) 500 MG capsule  3 times daily        06/20/23 0043              Dione Booze, MD 06/20/23 0104

## 2023-06-20 NOTE — Discharge Instructions (Addendum)
Staples have been removed, but there is some concern for infection where the staples were placed.  I have prescribed an antibiotic, cephalexin.  Please watch the wound for signs of infection.  If it seems like it is getting worse, she should be reevaluated.

## 2023-07-11 NOTE — Progress Notes (Signed)
Increased liver function tests

## 2023-07-14 ENCOUNTER — Inpatient Hospital Stay (HOSPITAL_COMMUNITY)
Admission: EM | Admit: 2023-07-14 | Discharge: 2023-07-20 | DRG: 177 | Disposition: A | Discharge status: Skilled Nursing Facility | Source: Skilled Nursing Facility | Attending: Internal Medicine | Admitting: Internal Medicine

## 2023-07-14 ENCOUNTER — Emergency Department (HOSPITAL_COMMUNITY)

## 2023-07-14 ENCOUNTER — Other Ambulatory Visit: Payer: Self-pay

## 2023-07-14 ENCOUNTER — Encounter (HOSPITAL_COMMUNITY): Payer: Self-pay

## 2023-07-14 DIAGNOSIS — G9341 Metabolic encephalopathy: Secondary | ICD-10-CM | POA: Diagnosis present

## 2023-07-14 DIAGNOSIS — E785 Hyperlipidemia, unspecified: Secondary | ICD-10-CM | POA: Diagnosis present

## 2023-07-14 DIAGNOSIS — F03918 Unspecified dementia, unspecified severity, with other behavioral disturbance: Secondary | ICD-10-CM | POA: Diagnosis present

## 2023-07-14 DIAGNOSIS — Z79899 Other long term (current) drug therapy: Secondary | ICD-10-CM | POA: Diagnosis not present

## 2023-07-14 DIAGNOSIS — I7121 Aneurysm of the ascending aorta, without rupture: Secondary | ICD-10-CM | POA: Diagnosis present

## 2023-07-14 DIAGNOSIS — I10 Essential (primary) hypertension: Secondary | ICD-10-CM | POA: Diagnosis present

## 2023-07-14 DIAGNOSIS — R296 Repeated falls: Secondary | ICD-10-CM | POA: Diagnosis present

## 2023-07-14 DIAGNOSIS — R531 Weakness: Secondary | ICD-10-CM | POA: Diagnosis not present

## 2023-07-14 DIAGNOSIS — Z8249 Family history of ischemic heart disease and other diseases of the circulatory system: Secondary | ICD-10-CM

## 2023-07-14 DIAGNOSIS — Z781 Physical restraint status: Secondary | ICD-10-CM

## 2023-07-14 DIAGNOSIS — Z7189 Other specified counseling: Secondary | ICD-10-CM | POA: Diagnosis not present

## 2023-07-14 DIAGNOSIS — Z87891 Personal history of nicotine dependence: Secondary | ICD-10-CM

## 2023-07-14 DIAGNOSIS — Z1152 Encounter for screening for COVID-19: Secondary | ICD-10-CM

## 2023-07-14 DIAGNOSIS — F0393 Unspecified dementia, unspecified severity, with mood disturbance: Secondary | ICD-10-CM | POA: Diagnosis present

## 2023-07-14 DIAGNOSIS — Z515 Encounter for palliative care: Secondary | ICD-10-CM | POA: Diagnosis not present

## 2023-07-14 DIAGNOSIS — J189 Pneumonia, unspecified organism: Secondary | ICD-10-CM | POA: Diagnosis not present

## 2023-07-14 DIAGNOSIS — Z66 Do not resuscitate: Secondary | ICD-10-CM | POA: Diagnosis present

## 2023-07-14 DIAGNOSIS — F32A Depression, unspecified: Secondary | ICD-10-CM | POA: Diagnosis present

## 2023-07-14 DIAGNOSIS — A419 Sepsis, unspecified organism: Principal | ICD-10-CM

## 2023-07-14 DIAGNOSIS — J690 Pneumonitis due to inhalation of food and vomit: Secondary | ICD-10-CM | POA: Diagnosis present

## 2023-07-14 DIAGNOSIS — K219 Gastro-esophageal reflux disease without esophagitis: Secondary | ICD-10-CM | POA: Diagnosis present

## 2023-07-14 DIAGNOSIS — F05 Delirium due to known physiological condition: Secondary | ICD-10-CM | POA: Diagnosis present

## 2023-07-14 DIAGNOSIS — I712 Thoracic aortic aneurysm, without rupture, unspecified: Secondary | ICD-10-CM

## 2023-07-14 DIAGNOSIS — I959 Hypotension, unspecified: Secondary | ICD-10-CM | POA: Diagnosis not present

## 2023-07-14 LAB — COMPREHENSIVE METABOLIC PANEL
ALT: 18 U/L (ref 0–44)
AST: 20 U/L (ref 15–41)
Albumin: 3.7 g/dL (ref 3.5–5.0)
Alkaline Phosphatase: 59 U/L (ref 38–126)
Anion gap: 9 (ref 5–15)
BUN: 18 mg/dL (ref 8–23)
CO2: 24 mmol/L (ref 22–32)
Calcium: 9.1 mg/dL (ref 8.9–10.3)
Chloride: 103 mmol/L (ref 98–111)
Creatinine, Ser: 0.5 mg/dL (ref 0.44–1.00)
GFR, Estimated: >60 mL/min (ref >60)
Glucose, Bld: 118 mg/dL — ABNORMAL HIGH (ref 70–99)
Potassium: 4.3 mmol/L (ref 3.5–5.1)
Sodium: 136 mmol/L (ref 135–145)
Total Bilirubin: 0.7 mg/dL (ref 0.3–1.2)
Total Protein: 6.5 g/dL (ref 6.5–8.1)

## 2023-07-14 LAB — RESP PANEL BY RT-PCR (RSV, FLU A&B, COVID)  RVPGX2
Influenza A by PCR: NEGATIVE
Influenza B by PCR: NEGATIVE
Resp Syncytial Virus by PCR: NEGATIVE
SARS Coronavirus 2 by RT PCR: NEGATIVE

## 2023-07-14 LAB — URINALYSIS, ROUTINE W REFLEX MICROSCOPIC
Bilirubin Urine: NEGATIVE
Glucose, UA: NEGATIVE mg/dL
Hgb urine dipstick: NEGATIVE
Ketones, ur: NEGATIVE mg/dL
Leukocytes,Ua: NEGATIVE
Nitrite: NEGATIVE
Protein, ur: NEGATIVE mg/dL
Specific Gravity, Urine: 1.015 (ref 1.005–1.030)
pH: 6 (ref 5.0–8.0)

## 2023-07-14 LAB — CBC WITH DIFFERENTIAL/PLATELET
Abs Immature Granulocytes: 0.06 10*3/uL (ref 0.00–0.07)
Basophils Absolute: 0.1 10*3/uL (ref 0.0–0.1)
Basophils Relative: 1 %
Eosinophils Absolute: 0.1 10*3/uL (ref 0.0–0.5)
Eosinophils Relative: 1 %
HCT: 33.5 % — ABNORMAL LOW (ref 36.0–46.0)
Hemoglobin: 11.3 g/dL — ABNORMAL LOW (ref 12.0–15.0)
Immature Granulocytes: 1 %
Lymphocytes Relative: 11 %
Lymphs Abs: 1.2 10*3/uL (ref 0.7–4.0)
MCH: 31.6 pg (ref 26.0–34.0)
MCHC: 33.7 g/dL (ref 30.0–36.0)
MCV: 93.6 fL (ref 80.0–100.0)
Monocytes Absolute: 0.4 10*3/uL (ref 0.1–1.0)
Monocytes Relative: 4 %
Neutro Abs: 8.7 10*3/uL — ABNORMAL HIGH (ref 1.7–7.7)
Neutrophils Relative %: 82 %
Platelets: 253 10*3/uL (ref 150–400)
RBC: 3.58 MIL/uL — ABNORMAL LOW (ref 3.87–5.11)
RDW: 12.7 % (ref 11.5–15.5)
WBC: 10.5 10*3/uL (ref 4.0–10.5)
nRBC: 0 % (ref 0.0–0.2)

## 2023-07-14 LAB — CBG MONITORING, ED: Glucose-Capillary: 77 mg/dL (ref 70–99)

## 2023-07-14 LAB — LACTIC ACID, PLASMA: Lactic Acid, Venous: 1.9 mmol/L (ref 0.5–1.9)

## 2023-07-14 MED ORDER — HALOPERIDOL LACTATE 5 MG/ML IJ SOLN
2.0000 mg | Freq: Once | INTRAMUSCULAR | Status: AC
  Administered 2023-07-14: 2 mg via INTRAVENOUS
  Filled 2023-07-14: qty 1

## 2023-07-14 MED ORDER — ACETAMINOPHEN 325 MG PO TABS
650.0000 mg | ORAL_TABLET | Freq: Four times a day (QID) | ORAL | Status: DC | PRN
  Administered 2023-07-19 – 2023-07-20 (×2): 650 mg via ORAL
  Filled 2023-07-14 (×2): qty 2

## 2023-07-14 MED ORDER — ACETAMINOPHEN 650 MG RE SUPP
650.0000 mg | Freq: Once | RECTAL | Status: AC
  Administered 2023-07-14: 650 mg via RECTAL
  Filled 2023-07-14: qty 1

## 2023-07-14 MED ORDER — SODIUM CHLORIDE 0.9 % IV SOLN
500.0000 mg | Freq: Once | INTRAVENOUS | Status: AC
  Administered 2023-07-14: 500 mg via INTRAVENOUS
  Filled 2023-07-14: qty 5

## 2023-07-14 MED ORDER — IOHEXOL 300 MG/ML  SOLN
100.0000 mL | Freq: Once | INTRAMUSCULAR | Status: AC | PRN
  Administered 2023-07-14: 100 mL via INTRAVENOUS

## 2023-07-14 MED ORDER — ACETAMINOPHEN 650 MG RE SUPP: 650.0000 mg | RECTAL | Status: DC | PRN

## 2023-07-14 MED ORDER — LORAZEPAM 2 MG/ML IJ SOLN
1.0000 mg | Freq: Once | INTRAMUSCULAR | Status: AC
  Administered 2023-07-14: 1 mg via INTRAVENOUS
  Filled 2023-07-14: qty 1

## 2023-07-14 MED ORDER — SODIUM CHLORIDE 0.9 % IV SOLN: 500.0000 mg | INTRAVENOUS | Status: DC

## 2023-07-14 MED ORDER — ENOXAPARIN SODIUM 40 MG/0.4ML IJ SOSY
40.0000 mg | PREFILLED_SYRINGE | INTRAMUSCULAR | Status: DC
  Administered 2023-07-14 – 2023-07-19 (×6): 40 mg via SUBCUTANEOUS
  Filled 2023-07-14 (×6): qty 0.4

## 2023-07-14 MED ORDER — SODIUM CHLORIDE 0.9 % IV BOLUS
1000.0000 mL | Freq: Once | INTRAVENOUS | Status: AC
  Administered 2023-07-14: 1000 mL via INTRAVENOUS

## 2023-07-14 MED ORDER — CEFTRIAXONE SODIUM 2 G IJ SOLR
2.0000 g | INTRAMUSCULAR | Status: DC
  Administered 2023-07-14 – 2023-07-18 (×5): 2 g via INTRAVENOUS
  Filled 2023-07-14 (×5): qty 20

## 2023-07-14 NOTE — Assessment & Plan Note (Addendum)
-  secondary to pneumonia in setting of dementia -hold Gabapentin

## 2023-07-14 NOTE — Assessment & Plan Note (Signed)
-  Hypotensive to 80s with EMS. Has been normotensive in ED.  -Hold any overnight antihypertensives

## 2023-07-14 NOTE — ED Notes (Signed)
.ED TO INPATIENT HANDOFF REPORT  Name/Age/Gender Tina Fitzpatrick 72 y.o. female  Code Status    Code Status Orders  (From admission, onward)           Start     Ordered   07/14/23 2133  Do not attempt resuscitation (DNR)- Limited -Do Not Intubate (DNI)  (Code Status)  Continuous       Question Answer Comment  If pulseless and not breathing No CPR or chest compressions.   In Pre-Arrest Conditions (Patient Is Breathing and Has A Pulse) Do not intubate. Provide all appropriate non-invasive medical interventions. Avoid ICU transfer unless indicated or required.   Consent: Discussion documented in EHR or advanced directives reviewed      07/14/23 2132           Code Status History     Date Active Date Inactive Code Status Order ID Comments User Context   07/14/2023 2131 07/14/2023 2132 Full Code [id number]  Anselm Jungling, DO ED   09/09/2017 0320 09/11/2017 1756 DNR [id number]  Eduard Clos, MD Inpatient   10/30/2015 1957 11/02/2015 1742 Full Code [id number]  Marinda Elk, MD ED       Home/SNF/Other Nursing Home  Chief Complaint Community acquired pneumonia [J18.9]  Level of Care/Admitting Diagnosis ED Disposition     ED Disposition  Admit   Condition  --   Comment  Hospital Area: [facility] [[id number]]  Level of Care: Med-Surg [16]  May admit patient to [facility] or [facility] if equivalent level of care is available:: No  Covid Evaluation: Asymptomatic - no recent exposure (last 10 days) testing not required  Diagnosis: Community acquired pneumonia [[id number]]  Admitting Physician: Anselm Jungling [[id number]]  Attending Physician: Anselm Jungling [[id number]]  Certification:: I certify this patient will need inpatient services for at least 2 midnights  Expected Medical Readiness: 07/17/2023          Medical History Past Medical History:  Diagnosis Date   DDD (degenerative disc disease), cervical    Dementia (HCC)    Depression    GERD  (gastroesophageal reflux disease)    Hyperlipidemia    Hypertension    Kidney stones     Allergies No Known Allergies  IV Location/Drains/Wounds Patient Lines/Drains/Airways Status     Active Line/Drains/Airways     Name Placement date Placement time Site Days   Peripheral IV 07/14/23 20 G Anterior;Left Forearm 07/14/23  1534  Forearm  less than 1   Peripheral IV 07/14/23 20 G Anterior;Right Forearm 07/14/23  1529  Forearm  less than 1            Labs/Imaging Results for orders placed or performed during the hospital encounter of 07/14/23 (from the past 48 hour(s))  CBC with Differential     Status: Abnormal   Collection Time: 07/14/23  3:35 PM  Result Value Ref Range   WBC 10.5 4.0 - 10.5 K/uL   RBC 3.58 (L) 3.87 - 5.11 MIL/uL   Hemoglobin 11.3 (L) 12.0 - 15.0 g/dL   HCT 53.6 (L) 64.4 - 03.4 %   MCV 93.6 80.0 - 100.0 fL   MCH 31.6 26.0 - 34.0 pg   MCHC 33.7 30.0 - 36.0 g/dL   RDW 74.2 59.5 - 63.8 %   Platelets 253 150 - 400 K/uL   nRBC 0.0 0.0 - 0.2 %   Neutrophils Relative % 82 %   Neutro Abs 8.7 (H) 1.7 - 7.7 K/uL  Lymphocytes Relative 11 %   Lymphs Abs 1.2 0.7 - 4.0 K/uL   Monocytes Relative 4 %   Monocytes Absolute 0.4 0.1 - 1.0 K/uL   Eosinophils Relative 1 %   Eosinophils Absolute 0.1 0.0 - 0.5 K/uL   Basophils Relative 1 %   Basophils Absolute 0.1 0.0 - 0.1 K/uL   Immature Granulocytes 1 %   Abs Immature Granulocytes 0.06 0.00 - 0.07 K/uL    Comment: Performed at [facility], [address] [place], [place] [address]  Comprehensive metabolic panel     Status: Abnormal   Collection Time: 07/14/23  3:35 PM  Result Value Ref Range   Sodium 136 135 - 145 mmol/L   Potassium 4.3 3.5 - 5.1 mmol/L   Chloride 103 98 - 111 mmol/L   CO2 24 22 - 32 mmol/L   Glucose, Bld 118 (H) 70 - 99 mg/dL    Comment: Glucose reference range applies only to samples taken after fasting for at least 8 hours.   BUN 18 8 - 23 mg/dL   Creatinine, Ser 4.78  0.44 - 1.00 mg/dL   Calcium 9.1 8.9 - 29.5 mg/dL   Total Protein 6.5 6.5 - 8.1 g/dL   Albumin 3.7 3.5 - 5.0 g/dL   AST 20 15 - 41 U/L   ALT 18 0 - 44 U/L   Alkaline Phosphatase 59 38 - 126 U/L   Total Bilirubin 0.7 0.3 - 1.2 mg/dL   GFR, Estimated >62 >13 mL/min    Comment: (NOTE) Calculated using the CKD-EPI Creatinine Equation (2021)    Anion gap 9 5 - 15    Comment: Performed at [facility], [address], [place], [place] [address]  Urinalysis, Routine w reflex microscopic -Urine, Catheterized     Status: None   Collection Time: 07/14/23  3:40 PM  Result Value Ref Range   Color, Urine YELLOW YELLOW   APPearance CLEAR CLEAR   Specific Gravity, Urine 1.015 1.005 - 1.030   pH 6.0 5.0 - 8.0   Glucose, UA NEGATIVE NEGATIVE mg/dL   Hgb urine dipstick NEGATIVE NEGATIVE   Bilirubin Urine NEGATIVE NEGATIVE   Ketones, ur NEGATIVE NEGATIVE mg/dL   Protein, ur NEGATIVE NEGATIVE mg/dL   Nitrite NEGATIVE NEGATIVE   Leukocytes,Ua NEGATIVE NEGATIVE    Comment: Performed at [facility], [address], [place], [place] [address]  CBG monitoring, ED     Status: None   Collection Time: 07/14/23  3:42 PM  Result Value Ref Range   Glucose-Capillary 77 70 - 99 mg/dL    Comment: Glucose reference range applies only to samples taken after fasting for at least 8 hours.  Lactic acid, plasma     Status: None   Collection Time: 07/14/23  3:48 PM  Result Value Ref Range   Lactic Acid, Venous 1.9 0.5 - 1.9 mmol/L    Comment: Performed at [facility], [address], [place], [place] [address]  Resp panel by RT-PCR (RSV, Flu A&B, Covid) Anterior Nasal Swab     Status: None   Collection Time: 07/14/23  3:54 PM   Specimen: Anterior Nasal Swab  Result Value Ref Range   SARS Coronavirus 2 by RT PCR NEGATIVE NEGATIVE    Comment: (NOTE) SARS-CoV-2 target nucleic acids are NOT DETECTED.  The SARS-CoV-2 RNA is generally detectable in upper  respiratory specimens during the acute phase of infection. The lowest concentration of SARS-CoV-2 viral copies this assay can detect  is 138 copies/mL. A negative result does not preclude SARS-Cov-2 infection and should not be used as the sole basis for treatment or other patient management decisions. A negative result may occur with  improper specimen collection/handling, submission of specimen other than nasopharyngeal swab, presence of viral mutation(s) within the areas targeted by this assay, and inadequate number of viral copies(<138 copies/mL). A negative result must be combined with clinical observations, patient history, and epidemiological information. The expected result is Negative.  Fact Sheet for Patients:  BloggerCourse.com  Fact Sheet for Healthcare Providers:  SeriousBroker.it  This test is no t yet approved or cleared by the [place] FDA and  has been authorized for detection and/or diagnosis of SARS-CoV-2 by FDA under an Emergency Use Authorization (EUA). This EUA will remain  in effect (meaning this test can be used) for the duration of the COVID-19 declaration under Section 564(b)(1) of the Act, 21 U.S.C.section 360bbb-3(b)(1), unless the authorization is terminated  or revoked sooner.       Influenza A by PCR NEGATIVE NEGATIVE   Influenza B by PCR NEGATIVE NEGATIVE    Comment: (NOTE) The Xpert Xpress SARS-CoV-2/FLU/RSV plus assay is intended as an aid in the diagnosis of influenza from Nasopharyngeal swab specimens and should not be used as a sole basis for treatment. Nasal washings and aspirates are unacceptable for Xpert Xpress SARS-CoV-2/FLU/RSV testing.  Fact Sheet for Patients: BloggerCourse.com  Fact Sheet for Healthcare Providers: SeriousBroker.it  This test is not yet approved or cleared by the [place] FDA and has been authorized for  detection and/or diagnosis of SARS-CoV-2 by FDA under an Emergency Use Authorization (EUA). This EUA will remain in effect (meaning this test can be used) for the duration of the COVID-19 declaration under Section 564(b)(1) of the Act, 21 U.S.C. section 360bbb-3(b)(1), unless the authorization is terminated or revoked.     Resp Syncytial Virus by PCR NEGATIVE NEGATIVE    Comment: (NOTE) Fact Sheet for Patients: BloggerCourse.com  Fact Sheet for Healthcare Providers: SeriousBroker.it  This test is not yet approved or cleared by the [place] FDA and has been authorized for detection and/or diagnosis of SARS-CoV-2 by FDA under an Emergency Use Authorization (EUA). This EUA will remain in effect (meaning this test can be used) for the duration of the COVID-19 declaration under Section 564(b)(1) of the Act, 21 U.S.C. section 360bbb-3(b)(1), unless the authorization is terminated or revoked.  Performed at [facility], [address] [place], [place] [address]    CT CHEST ABDOMEN PELVIS W CONTRAST  Result Date: 07/14/2023 CLINICAL DATA:  Sepsis. Altered mental status. Possible fall. Hypotension. EXAM: CT CHEST, ABDOMEN, AND PELVIS WITH CONTRAST TECHNIQUE: Multidetector CT imaging of the chest, abdomen and pelvis was performed following the standard protocol during bolus administration of intravenous contrast. RADIATION DOSE REDUCTION: This exam was performed according to the departmental dose-optimization program which includes automated exposure control, adjustment of the mA and/or kV according to patient size and/or use of iterative reconstruction technique. CONTRAST:  OMNIPAQUE IOHEXOL 300 MG/ML  SOLN COMPARISON:  Chest radiograph 07/14/2023. CT abdomen and pelvis 12/03/2018 FINDINGS: CT CHEST FINDINGS Cardiovascular: Normal heart size. No pericardial effusions. Ascending thoracic aortic aneurysm measuring 4.1  cm AP diameter. No dissection. Great vessel origins are patent. Calcification of the aorta and coronary arteries. Mediastinum/Nodes: No enlarged mediastinal, hilar, or axillary lymph nodes. Thyroid gland, trachea, and esophagus demonstrate no significant findings. Lungs/Pleura: Mild dependent atelectasis in the lung bases. Focal area of peribronchial infiltration in  the right upper lung centrally. This may represent focal pneumonia or possibly limited aspiration. No pleural effusions. No pneumothorax. Musculoskeletal: Degenerative changes in the spine. No focal bone lesions. Postoperative changes in the cervical spine. CT ABDOMEN PELVIS FINDINGS Hepatobiliary: No focal liver lesions. No bile duct dilatation. Gallbladder is not identified and could be contracted or surgically absent. Pancreas: Unremarkable. No pancreatic ductal dilatation or surrounding inflammatory changes. Spleen: Normal in size without focal abnormality. Adrenals/Urinary Tract: Adrenal glands are unremarkable. Kidneys are normal, without renal calculi, focal lesion, or hydronephrosis. Bladder is unremarkable. Stomach/Bowel: Stomach is mildly distended with ingested material. No discrete wall thickening or inflammatory change. Small bowel and colon are not abnormally distended. Stool fills the colon. Appendix is not identified. Vascular/Lymphatic: Aortic atherosclerosis. No enlarged abdominal or pelvic lymph nodes. Reproductive: Uterus and bilateral adnexa are unremarkable. Other: Small amount of free fluid in the abdomen and pelvis, likely mild ascites. No free air. Abdominal wall musculature appears intact. Musculoskeletal: Degenerative changes in the spine. No focal bone lesions. Comment: Examination is technically limited due to nonstandard patient positioning. IMPRESSION: 1. Ascending thoracic aortic aneurysm measuring 4.1 cm AP diameter. Recommend annual imaging followup by CTA or MRA. This recommendation follows 2010  ACCF/AHA/AATS/ACR/ASA/SCA/SCAI/SIR/STS/SVM Guidelines for the Diagnosis and Management of Patients with Thoracic Aortic Disease. Circulation. 2010; 121: Z610-R604. Aortic aneurysm NOS (ICD10-I71.9) 2. Focal area of peribronchial infiltration in the right mid lung centrally may represent focal bronchopneumonia or aspiration. 3. No acute process demonstrated in the abdomen or pelvis. Stomach appears distended with ingested material but no obstructing lesion is identified. 4. Small amount of free fluid in the abdomen and pelvis likely representing mild ascites. 5. Aortic atherosclerosis. Electronically Signed   By: Burman Nieves M.D.   On: 07/14/2023 20:42   CT Head Wo Contrast  Result Date: 07/14/2023 CLINICAL DATA:  Possible fall, hypotension, febrile, altered level of consciousness EXAM: CT HEAD WITHOUT CONTRAST TECHNIQUE: Contiguous axial images were obtained from the base of the skull through the vertex without intravenous contrast. RADIATION DOSE REDUCTION: This exam was performed according to the departmental dose-optimization program which includes automated exposure control, adjustment of the mA and/or kV according to patient size and/or use of iterative reconstruction technique. COMPARISON:  06/15/2023 FINDINGS: Brain: No evidence of acute infarct or hemorrhage. Lateral ventricles and midline structures are unremarkable. No acute extra-axial fluid collections. No mass effect. Vascular: No hyperdense vessel or unexpected calcification. Skull: Increased posterior scalp hematoma. No underlying fractures. The remainder of the calvarium is unremarkable. Sinuses/Orbits: No acute finding. Other: None. IMPRESSION: 1. Enlarged posterior scalp hematoma.  No underlying fracture. 2. No acute intracranial process. Electronically Signed   By: Sharlet Salina M.D.   On: 07/14/2023 18:13   DG Chest Portable 1 View  Result Date: 07/14/2023 CLINICAL DATA:  Altered level of consciousness EXAM: PORTABLE CHEST 1 VIEW  COMPARISON:  10/09/2019 FINDINGS: Single frontal view of the chest was obtained with the patient rotated toward the right. The cardiac silhouette is unremarkable. No acute airspace disease, effusion, or pneumothorax. Subacute right posterolateral fifth rib fracture with callus formation. No other acute bony abnormalities. IMPRESSION: 1. Healing subacute right posterolateral fifth rib fracture. 2. Otherwise no acute intrathoracic process. Electronically Signed   By: Sharlet Salina M.D.   On: 07/14/2023 18:11    Pending Labs Unresulted Labs (From admission, onward)     Start     Ordered   07/15/23 0500  CBC  Tomorrow morning,   R  07/14/23 2131   07/15/23 0500  Basic metabolic panel  Tomorrow morning,   R        07/14/23 2131   07/14/23 1507  Urine Culture  Once,   URGENT       Question:  Indication  Answer:  Sepsis   07/14/23 1506   07/14/23 1506  Culture, blood (routine x 2)  BLOOD CULTURE X 2,   R (with STAT occurrences)      07/14/23 1506            Vitals/Pain Today's Vitals   07/14/23 1515 07/14/23 1700 07/14/23 1924 07/14/23 2100  BP:   (!) 99/51 119/62  Pulse:   60 76  Resp:   16 16  Temp: 100.2 F (37.9 C)  (!) 97.1 F (36.2 C) 98.5 F (36.9 C)  TempSrc: Rectal  Axillary Oral  SpO2:   100% 99%  Weight:      Height:      PainSc:  6       Isolation Precautions No active isolations  Medications Medications  cefTRIAXone (ROCEPHIN) 2 g in sodium chloride 0.9 % 100 mL IVPB (0 g Intravenous Stopped 07/14/23 1620)  enoxaparin (LOVENOX) injection 40 mg (40 mg Subcutaneous Given 07/14/23 2210)  azithromycin (ZITHROMAX) 500 mg in sodium chloride 0.9 % 250 mL IVPB (has no administration in time range)  acetaminophen (TYLENOL) suppository 650 mg (has no administration in time range)  acetaminophen (TYLENOL) tablet 650 mg (has no administration in time range)  sodium chloride 0.9 % bolus 1,000 mL (0 mLs Intravenous Stopped 07/14/23 1649)  acetaminophen (TYLENOL)  suppository 650 mg (650 mg Rectal Given 07/14/23 1551)  LORazepam (ATIVAN) injection 1 mg (1 mg Intravenous Given 07/14/23 1551)  haloperidol lactate (HALDOL) injection 2 mg (2 mg Intravenous Given 07/14/23 1705)  iohexol (OMNIPAQUE) 300 MG/ML solution 100 mL (100 mLs Intravenous Contrast Given 07/14/23 1855)  azithromycin (ZITHROMAX) 500 mg in sodium chloride 0.9 % 250 mL IVPB (0 mg Intravenous Stopped 07/14/23 2305)    Mobility walks with device

## 2023-07-14 NOTE — H&P (Addendum)
History and Physical    Patient: Tina Fitzpatrick NWG:[id number] DOB: September 20, 1950 DOA: 07/14/2023 DOS: the patient was seen and examined on 07/14/2023 PCP: Patient, No Pcp Per  Patient coming from: [facility]   Chief Complaint:  Chief Complaint  Patient presents with   Altered Mental Status   HPI: Tina Fitzpatrick is a 72 y.o. female with medical history significant of dementia, HTN, HLD, depression and GERD who presents with AMS.   Husband reports that SNF just told him she fell. She has falls frequently and has unsteady gait but does not use assistive device. He is not able to clarify her baseline dementia.  Reportedly on EMS arrival she was hypotensive to SBP 80s with improvement with fluids. She was combative with EMS.   She was last evaluated in ED at the beginning of the month for a fall with scalp laceration requiring staples that later had to be treated for cellulitis.   On arrival to ED, she was febrile 100.39F, WBC of 10.5. Lactic within normal limits.   BMP unremarkable.   UA negative. Negative COVID/Flu/RSV   CT head with enlarged posterior scalp hematoma. No fracture or intracranial process.   CXR with subacute right posterolateral fifth rib fracture but no acute process.   CT chest/abdomen/pelvis  with Ascending thoracic aortic aneurysm measuring 4.1 cm AP diameter. Focal area of peribronchial infiltration in the right mid lung centrally may represent focal bronchopneumonia or aspiration.  She was started on IV Rocephin and Azithromycin and administered IV fluids. Had to receive Haldol and be put on soft restraints due to agitation with staff.  Review of Systems: unable to review all systems due to the inability of the patient to answer questions. Past Medical History:  Diagnosis Date   DDD (degenerative disc disease), cervical    Dementia (HCC)    Depression    GERD (gastroesophageal reflux disease)    Hyperlipidemia    Hypertension    Kidney stones     Past Surgical History:  Procedure Laterality Date   kidney stones  1988   NECK SURGERY  2008   Plate/Screws   Social History:  reports that she has quit smoking. Her smoking use included cigarettes. She has never used smokeless tobacco. She reports that she does not drink alcohol and does not use drugs.  No Known Allergies  Family History  Problem Relation Age of Onset   Heart attack Mother    Colon cancer Neg Hx    Esophageal cancer Neg Hx     Prior to Admission medications   Medication Sig Start Date End Date Taking? Authorizing Provider  acetaminophen (TYLENOL) 500 MG tablet Take 500 mg by mouth in the morning, at noon, and at bedtime.   Yes [provider]  clonazePAM (KLONOPIN) 0.5 MG tablet Take 0.5 mg by mouth daily as needed (agitation).   Yes [provider]  gabapentin (NEURONTIN) 400 MG capsule Take 400 mg by mouth 3 (three) times daily.   Yes [provider]  ipratropium-albuterol (DUONEB) 0.5-2.5 (3) MG/3ML SOLN Take 3 mLs by nebulization every 6 (six) hours as needed (wheezing/SOB).   Yes [provider]  neomycin-bacitracin-polymyxin [phone] OINT Apply 1 Application topically See admin instructions. Apply a pea-sized amount of ointment to wound on scalp once a day after cleansing and drying- until healed   Yes [provider]  NON FORMULARY Apply 1 application  topically See admin instructions. Zinc paste 17%- Apply to buttocks/perineal area with incontinent care- every shift (  per [facility])   Yes [provider]  Nutritional Supplements (NUTRITIONAL SHAKE) LIQD Take 2 Bottles by mouth See admin instructions. Drink 2 cartons/bottles by mouth three times a day   Yes [provider]  Povidone-Iodine Paint Sponge 10 % SWAB Apply 1 application  topically See admin instructions. Apply to scalp laceration once a day. Cleanse gently with swab, allow to dry, then apply a pea-sized amount of triple antibiotic ointment  to the wound. Apply until healed.   Yes [provider]  propranolol (INDERAL) 10 MG tablet Take 10 mg by mouth See admin instructions. Take 10 mg by mouth two times a day and hold for pulse under 55   Yes [provider]  traZODone (DESYREL) 100 MG tablet Take 100 mg by mouth at bedtime.   Yes [provider]  traZODone (DESYREL) 50 MG tablet Take 25-50 mg by mouth See admin instructions. Take 50 mg by mouth every morning and 25 mg by mouth every afternoon.   Yes [provider]  cephALEXin (KEFLEX) 500 MG capsule Take 1 capsule (500 mg total) by mouth 3 (three) times daily. Patient not taking: Reported on 07/14/2023 06/20/23   Dione Booze, MD    Physical Exam: Vitals:   07/14/23 1505 07/14/23 1515 07/14/23 1924 07/14/23 2100  BP: 120/76  (!) 99/51 119/62  Pulse: 73  60 76  Resp: (!) 22  16 16   Temp:  100.2 F (37.9 C) (!) 97.1 F (36.2 C) 98.5 F (36.9 C)  TempSrc:  Rectal Axillary Oral  SpO2: 98%  100% 99%  Weight:      Height:       Constitutional: NAD, calm, comfortable, lying in bed with 4-point soft restraints. Received Haldo earlier.  Eyes: closed eyes throughout evaluation ENMT: Mucous membranes are moist. Neck: normal, supple Respiratory: clear to auscultation bilaterally, no wheezing, no crackles. Normal respiratory effort. No accessory muscle use.  Cardiovascular: Regular rate and rhythm, no murmurs / rubs / gallops. No extremity edema.   Abdomen: no grimace with palpation Musculoskeletal: no clubbing / cyanosis. No joint deformity upper and lower extremities.   Skin: no rashes, lesions, ulcers. No induration. Dry crusting blood to posterior scalp  Neurologic: Asleep but moves to noxious stimuli Psychiatric: unable to access Data Reviewed:  See HPI  Assessment and Plan: * CAP (community acquired pneumonia) -CT chest with Focal area of peribronchial infiltration in the right mid lung centrally may represent focal bronchopneumonia  or aspiration. WBC also upward trending with increase abs neutrophils.  -continue IV Rocephin and Azithromcyin  Thoracic aortic aneurysm without rupture (HCC) -Ascending thoracic aortic aneurysm measuring 4.1 cm AP diameter seen on CT abd/pelvis. Recommend annual imaging followup by CTA or MRA.  Dementia with behavioral disturbance [facility]) -husband not able to clarify her baseline but likely is severe -She was combative with EMS and in ED. Received Haldol and placed on soft restraints. Will continue restraints until she clinically improves.   Hypotension -Hypotensive to 80s with EMS. Has been normotensive in ED.  -Hold any overnight antihypertensives   Acute metabolic encephalopathy -secondary to pneumonia in setting of dementia -hold Gabapentin      Advance Care Planning:   Code Status: Limited: Do not attempt resuscitation (DNR) -DNR-LIMITED -Do Not Intubate/DNI   Consults: none  Family Communication:  Husband over the phone  Severity of Illness: The appropriate patient status for this patient is INPATIENT. Inpatient status is judged to be reasonable and necessary in order to provide the required intensity  of service to ensure the patient's safety. The patient's presenting symptoms, physical exam findings, and initial radiographic and laboratory data in the context of their chronic comorbidities is felt to place them at high risk for further clinical deterioration. Furthermore, it is not anticipated that the patient will be medically stable for discharge from the hospital within 2 midnights of admission.   * I certify that at the point of admission it is my clinical judgment that the patient will require inpatient hospital care spanning beyond 2 midnights from the point of admission due to high intensity of service, high risk for further deterioration and high frequency of surveillance required.*  Author: Anselm Jungling, DO 07/14/2023 9:47 PM  For on call review [email or web address].

## 2023-07-14 NOTE — Progress Notes (Signed)
Elink is following code sepsis 

## 2023-07-14 NOTE — Assessment & Plan Note (Signed)
-  husband not able to clarify her baseline but likely is severe -She was combative with EMS and in ED. Received Haldol and placed on soft restraints. Will continue restraints until she clinically improves.

## 2023-07-14 NOTE — ED Provider Notes (Signed)
[facility] EMERG[facility]Y DEPARTMENT AT [facility] Provider Note   CSN: [id number] Arrival date & time: 07/14/23  1449     History  Chief Complaint  Patient presents with   Altered Mental Status    Tina Fitzpatrick is a 72 y.o. female.  Pt is a 72 yo female with pmhx significant for gerd, htn, hld, depression, kidney stones, and dementia.  EMS was called due to AMS.  Pt is unable to give any hx due to dementia.  Pt's bp in the 80s for EMS initially and temp 101.  Pt given 500 cc NS en route.  Bp has improved.         Home Medications Prior to Admission medications   Medication Sig Start Date End Date Taking? Authorizing Provider  acetaminophen (TYLENOL) 500 MG tablet Take 500 mg by mouth in the morning, at noon, and at bedtime.   Yes [provider]  clonazePAM (KLONOPIN) 0.5 MG tablet Take 0.5 mg by mouth daily as needed (agitation).   Yes [provider]  gabapentin (NEURONTIN) 400 MG capsule Take 400 mg by mouth 3 (three) times daily.   Yes [provider]  ipratropium-albuterol (DUONEB) 0.5-2.5 (3) MG/3ML SOLN Take 3 mLs by nebulization every 6 (six) hours as needed (wheezing/SOB).   Yes [provider]  neomycin-bacitracin-polymyxin 3.5-989 037 2119 OINT Apply 1 Application topically See admin instructions. Apply a pea-sized amount of ointment to wound on scalp once a day after cleansing and drying- until healed   Yes [provider]  NON FORMULARY Apply 1 application  topically See admin instructions. Zinc paste 17%- Apply to buttocks/perineal area with incontinent care- every shift (per [facility])   Yes [provider]  Nutritional Supplements (NUTRITIONAL SHAKE) LIQD Take 2 Bottles by mouth See admin instructions. Drink 2 cartons/bottles by mouth three times a day   Yes [provider]  Povidone-Iodine Paint Sponge 10 % SWAB Apply 1 application  topically See admin instructions. Apply to scalp laceration once a day.  Cleanse gently with swab, allow to dry, then apply a pea-sized amount of triple antibiotic ointment to the wound. Apply until healed.   Yes [provider]  propranolol (INDERAL) 10 MG tablet Take 10 mg by mouth See admin instructions. Take 10 mg by mouth two times a day and hold for pulse under 55   Yes [provider]  traZODone (DESYREL) 100 MG tablet Take 100 mg by mouth at bedtime.   Yes [provider]  traZODone (DESYREL) 50 MG tablet Take 25-50 mg by mouth See admin instructions. Take 50 mg by mouth every morning and 25 mg by mouth every afternoon.   Yes [provider]  cephALEXin (KEFLEX) 500 MG capsule Take 1 capsule (500 mg total) by mouth 3 (three) times daily. Patient not taking: Reported on 07/14/2023 06/20/23   Dione Booze, MD      Allergies    Patient has no known allergies.    Review of Systems   Review of Systems  Unable to perform ROS: Dementia  All other systems reviewed and are negative.   Physical Exam Updated Vital Signs BP 119/62   Pulse 76   Temp 98.5 F (36.9 C) (Oral)   Resp 16   Ht 5\' 6"  (1.676 m)   Wt 59 kg   SpO2 99%   BMI 20.99 kg/m  Physical Exam Vitals and nursing note reviewed.  Constitutional:      Appearance: Normal appearance.  HENT:  Head: Normocephalic.     Comments: Healing wound to occiput    Right Ear: External ear normal.     Left Ear: External ear normal.     Nose: Nose normal.     Mouth/Throat:     Mouth: Mucous membranes are dry.     Pharynx: Oropharynx is clear.  Eyes:     Extraocular Movements: Extraocular movements intact.     Conjunctiva/sclera: Conjunctivae normal.     Pupils: Pupils are equal, round, and reactive to light.  Cardiovascular:     Rate and Rhythm: Normal rate and regular rhythm.     Pulses: Normal pulses.     Heart sounds: Normal heart sounds.  Pulmonary:     Effort: Pulmonary effort is normal.     Breath sounds: Normal breath sounds.  Abdominal:     General:  Abdomen is flat. Bowel sounds are normal.     Palpations: Abdomen is soft.  Musculoskeletal:        General: Normal range of motion.     Cervical back: Normal range of motion and neck supple.  Skin:    General: Skin is warm.     Capillary Refill: Capillary refill takes less than 2 seconds.  Neurological:     Mental Status: She is alert. She is disoriented.  Psychiatric:        Behavior: Behavior is agitated and aggressive.     ED Results / Procedures / Treatments   Labs (all labs ordered are listed, but only abnormal results are displayed) Labs Reviewed  CBC WITH DIFFERENTIAL/PLATELET - Abnormal; Notable for the following components:      Result Value   RBC 3.58 (*)    Hemoglobin 11.3 (*)    HCT 33.5 (*)    Neutro Abs 8.7 (*)    All other components within normal limits  COMPREHENSIVE METABOLIC PANEL - Abnormal; Notable for the following components:   Glucose, Bld 118 (*)    All other components within normal limits  RESP PANEL BY RT-PCR (RSV, FLU A&B, COVID)  RVPGX2  CULTURE, BLOOD (ROUTINE X 2)  CULTURE, BLOOD (ROUTINE X 2)  URINE CULTURE  URINALYSIS, ROUTINE W REFLEX MICROSCOPIC  LACTIC ACID, PLASMA  CBC  BASIC METABOLIC PANEL  CBG MONITORING, ED    EKG None  Radiology CT CHEST ABDOMEN PELVIS W CONTRAST  Result Date: 07/14/2023 CLINICAL DATA:  Sepsis. Altered mental status. Possible fall. Hypotension. EXAM: CT CHEST, ABDOMEN, AND PELVIS WITH CONTRAST TECHNIQUE: Multidetector CT imaging of the chest, abdomen and pelvis was performed following the standard protocol during bolus administration of intravenous contrast. RADIATION DOSE REDUCTION: This exam was performed according to the departmental dose-optimization program which includes automated exposure control, adjustment of the mA and/or kV according to patient size and/or use of iterative reconstruction technique. CONTRAST:  OMNIPAQUE IOHEXOL 300 MG/ML  SOLN COMPARISON:  Chest radiograph 07/14/2023. CT  abdomen and pelvis 12/03/2018 FINDINGS: CT CHEST FINDINGS Cardiovascular: Normal heart size. No pericardial effusions. Ascending thoracic aortic aneurysm measuring 4.1 cm AP diameter. No dissection. Great vessel origins are patent. Calcification of the aorta and coronary arteries. Mediastinum/Nodes: No enlarged mediastinal, hilar, or axillary lymph nodes. Thyroid gland, trachea, and esophagus demonstrate no significant findings. Lungs/Pleura: Mild dependent atelectasis in the lung bases. Focal area of peribronchial infiltration in the right upper lung centrally. This may represent focal pneumonia or possibly limited aspiration. No pleural effusions. No pneumothorax. Musculoskeletal: Degenerative changes in the spine. No focal bone lesions. Postoperative changes in the cervical spine.  CT ABDOMEN PELVIS FINDINGS Hepatobiliary: No focal liver lesions. No bile duct dilatation. Gallbladder is not identified and could be contracted or surgically absent. Pancreas: Unremarkable. No pancreatic ductal dilatation or surrounding inflammatory changes. Spleen: Normal in size without focal abnormality. Adrenals/Urinary Tract: Adrenal glands are unremarkable. Kidneys are normal, without renal calculi, focal lesion, or hydronephrosis. Bladder is unremarkable. Stomach/Bowel: Stomach is mildly distended with ingested material. No discrete wall thickening or inflammatory change. Small bowel and colon are not abnormally distended. Stool fills the colon. Appendix is not identified. Vascular/Lymphatic: Aortic atherosclerosis. No enlarged abdominal or pelvic lymph nodes. Reproductive: Uterus and bilateral adnexa are unremarkable. Other: Small amount of free fluid in the abdomen and pelvis, likely mild ascites. No free air. Abdominal wall musculature appears intact. Musculoskeletal: Degenerative changes in the spine. No focal bone lesions. Comment: Examination is technically limited due to nonstandard patient positioning. IMPRESSION: 1.  Ascending thoracic aortic aneurysm measuring 4.1 cm AP diameter. Recommend annual imaging followup by CTA or MRA. This recommendation follows 2010 ACCF/AHA/AATS/ACR/ASA/SCA/SCAI/SIR/STS/SVM Guidelines for the Diagnosis and Management of Patients with Thoracic Aortic Disease. Circulation. 2010; 121: Z610-R604. Aortic aneurysm NOS (ICD10-I71.9) 2. Focal area of peribronchial infiltration in the right mid lung centrally may represent focal bronchopneumonia or aspiration. 3. No acute process demonstrated in the abdomen or pelvis. Stomach appears distended with ingested material but no obstructing lesion is identified. 4. Small amount of free fluid in the abdomen and pelvis likely representing mild ascites. 5. Aortic atherosclerosis. Electronically Signed   By: Burman Nieves M.D.   On: 07/14/2023 20:42   CT Head Wo Contrast  Result Date: 07/14/2023 CLINICAL DATA:  Possible fall, hypotension, febrile, altered level of consciousness EXAM: CT HEAD WITHOUT CONTRAST TECHNIQUE: Contiguous axial images were obtained from the base of the skull through the vertex without intravenous contrast. RADIATION DOSE REDUCTION: This exam was performed according to the departmental dose-optimization program which includes automated exposure control, adjustment of the mA and/or kV according to patient size and/or use of iterative reconstruction technique. COMPARISON:  06/15/2023 FINDINGS: Brain: No evidence of acute infarct or hemorrhage. Lateral ventricles and midline structures are unremarkable. No acute extra-axial fluid collections. No mass effect. Vascular: No hyperdense vessel or unexpected calcification. Skull: Increased posterior scalp hematoma. No underlying fractures. The remainder of the calvarium is unremarkable. Sinuses/Orbits: No acute finding. Other: None. IMPRESSION: 1. Enlarged posterior scalp hematoma.  No underlying fracture. 2. No acute intracranial process. Electronically Signed   By: Sharlet Salina M.D.   On:  07/14/2023 18:13   DG Chest Portable 1 View  Result Date: 07/14/2023 CLINICAL DATA:  Altered level of consciousness EXAM: PORTABLE CHEST 1 VIEW COMPARISON:  10/09/2019 FINDINGS: Single frontal view of the chest was obtained with the patient rotated toward the right. The cardiac silhouette is unremarkable. No acute airspace disease, effusion, or pneumothorax. Subacute right posterolateral fifth rib fracture with callus formation. No other acute bony abnormalities. IMPRESSION: 1. Healing subacute right posterolateral fifth rib fracture. 2. Otherwise no acute intrathoracic process. Electronically Signed   By: Sharlet Salina M.D.   On: 07/14/2023 18:11    Procedures Procedures    Medications Ordered in ED Medications  cefTRIAXone (ROCEPHIN) 2 g in sodium chloride 0.9 % 100 mL IVPB (0 g Intravenous Stopped 07/14/23 1620)  azithromycin (ZITHROMAX) 500 mg in sodium chloride 0.9 % 250 mL IVPB (has no administration in time range)  enoxaparin (LOVENOX) injection 40 mg (has no administration in time range)  sodium chloride 0.9 % bolus 1,000 mL (0 mLs  Intravenous Stopped 07/14/23 1649)  acetaminophen (TYLENOL) suppository 650 mg (650 mg Rectal Given 07/14/23 1551)  LORazepam (ATIVAN) injection 1 mg (1 mg Intravenous Given 07/14/23 1551)  haloperidol lactate (HALDOL) injection 2 mg (2 mg Intravenous Given 07/14/23 1705)  iohexol (OMNIPAQUE) 300 MG/ML solution 100 mL (100 mLs Intravenous Contrast Given 07/14/23 1855)    ED Course/ Medical Decision Making/ A&P                                 Medical Decision Making Amount and/or Complexity of Data Reviewed Labs: ordered. Radiology: ordered.  Risk OTC drugs. Prescription drug management. Decision regarding hospitalization.   This patient presents to the ED for concern of AMS, this involves an extensive number of treatment options, and is a complaint that carries with it a high risk of complications and morbidity.  The differential diagnosis  includes sepsis, infection, electrolyte abn, head injury   Co morbidities that complicate the patient evaluation  gerd, htn, hld, depression, kidney stones, and dementia   Additional history obtained:  Additional history obtained from epic chart review External records from outside source obtained and reviewed including EMS report   Lab Tests:  I Ordered, and personally interpreted labs.  The pertinent results include:  ua nl, cbc nl, cmp nl, ua nl   Imaging Studies ordered:  I ordered imaging studies including cxr, ct head  I independently visualized and interpreted imaging which showed  CXR: . Healing subacute right posterolateral fifth rib fracture.  2. Otherwise no acute intrathoracic process.  CT head:  Enlarged posterior scalp hematoma.  No underlying fracture.  2. No acute intracranial process.  CT chest/abd/pelvis:  Ascending thoracic aortic aneurysm measuring 4.1 cm AP diameter.  Recommend annual imaging followup by CTA or MRA. This recommendation  follows 2010 ACCF/AHA/AATS/ACR/ASA/SCA/SCAI/SIR/STS/SVM Guidelines  for the Diagnosis and Management of Patients with Thoracic Aortic  Disease. Circulation. 2010; 121: O130-Q657. Aortic aneurysm NOS  (ICD10-I71.9)  2. Focal area of peribronchial infiltration in the right mid lung  centrally may represent focal bronchopneumonia or aspiration.  3. No acute process demonstrated in the abdomen or pelvis. Stomach  appears distended with ingested material but no obstructing lesion  is identified.  4. Small amount of free fluid in the abdomen and pelvis likely  representing mild ascites.  5. Aortic atherosclerosis.   I agree with the radiologist interpretation   Cardiac Monitoring:  The patient was maintained on a cardiac monitor.  I personally viewed and interpreted the cardiac monitored which showed an underlying rhythm of: nsr   Medicines ordered and prescription drug management:  I ordered medication including  rocephin/zithromax  for cap  Reevaluation of the patient after these medicines showed that the patient improved I have reviewed the patients home medicines and have made adjustments as needed   Test Considered:  ct   Critical Interventions:  abx   Consultations Obtained:  I requested consultation with the hospitalist (Dr. Cyndia Bent),  and discussed lab and imaging findings as well as pertinent plan - she will admit   Problem List / ED Course:  Encephalopathy:  likely due to the pna.  Pt required ativan and haldol and soft restraints to keep herself and staff safe. CAP:  pt given rocephin/zithromax Hypotension:  improved after fluids.     Reevaluation:  After the interventions noted above, I reevaluated the patient and found that they have :improved   Social Determinants of Health:  Lives  in a facility   Dispostion:  After consideration of the diagnostic results and the patients response to treatment, I feel that the patent would benefit from admission.  CRITICAL CARE Performed by: Jacalyn Lefevre   Total critical care time: 30 minutes  Critical care time was exclusive of separately billable procedures and treating other patients.  Critical care was necessary to treat or prevent imminent or life-threatening deterioration.  Critical care was time spent personally by me on the following activities: development of treatment plan with patient and/or surrogate as well as nursing, discussions with consultants, evaluation of patient's response to treatment, examination of patient, obtaining history from patient or surrogate, ordering and performing treatments and interventions, ordering and review of laboratory studies, ordering and review of radiographic studies, pulse oximetry and re-evaluation of patient's condition.           Final Clinical Impression(s) / ED Diagnoses Final diagnoses:  Sepsis with encephalopathy without septic shock, due to unspecified organism [facility])   Community acquired pneumonia of right middle lobe of lung    Rx / DC Orders ED Discharge Orders     None         Jacalyn Lefevre, MD 07/14/23 2143

## 2023-07-14 NOTE — ED Triage Notes (Signed)
Patient brought in by EMS due to altered mental status. Per EMS, patient may have sustained a fall but unknown. Pt was febrile and hypotensive with EMS. Pt is combative with EMS. A&O X 1.

## 2023-07-14 NOTE — Assessment & Plan Note (Signed)
-  CT chest with Focal area of peribronchial infiltration in the right mid lung centrally may represent focal bronchopneumonia or aspiration. WBC also upward trending with increase abs neutrophils.  -continue IV Rocephin and Azithromcyin

## 2023-07-14 NOTE — Assessment & Plan Note (Signed)
-  Ascending thoracic aortic aneurysm measuring 4.1 cm AP diameter seen on CT abd/pelvis. Recommend annual imaging followup by CTA or MRA.

## 2023-07-14 NOTE — ED Notes (Signed)
Pt is in CT will get vital on return

## 2023-07-14 NOTE — ED Notes (Signed)
Pt bed alarm has been activated

## 2023-07-15 DIAGNOSIS — G9341 Metabolic encephalopathy: Secondary | ICD-10-CM | POA: Diagnosis not present

## 2023-07-15 LAB — BASIC METABOLIC PANEL
Anion gap: 6 (ref 5–15)
BUN: 13 mg/dL (ref 8–23)
CO2: 23 mmol/L (ref 22–32)
Calcium: 8.6 mg/dL — ABNORMAL LOW (ref 8.9–10.3)
Chloride: 107 mmol/L (ref 98–111)
Creatinine, Ser: 0.4 mg/dL — ABNORMAL LOW (ref 0.44–1.00)
GFR, Estimated: >60 mL/min (ref >60)
Glucose, Bld: 91 mg/dL (ref 70–99)
Potassium: 4.1 mmol/L (ref 3.5–5.1)
Sodium: 136 mmol/L (ref 135–145)

## 2023-07-15 LAB — BLOOD CULTURE ID PANEL (REFLEXED) - BCID2

## 2023-07-15 LAB — URINE CULTURE: Culture: NO GROWTH

## 2023-07-15 LAB — CBC
HCT: 34.1 % — ABNORMAL LOW (ref 36.0–46.0)
Hemoglobin: 10.8 g/dL — ABNORMAL LOW (ref 12.0–15.0)
MCH: 31.6 pg (ref 26.0–34.0)
MCHC: 31.7 g/dL (ref 30.0–36.0)
MCV: 99.7 fL (ref 80.0–100.0)
Platelets: 153 10*3/uL (ref 150–400)
RBC: 3.42 MIL/uL — ABNORMAL LOW (ref 3.87–5.11)
RDW: 12.8 % (ref 11.5–15.5)
WBC: 6.2 10*3/uL (ref 4.0–10.5)
nRBC: 0 % (ref 0.0–0.2)

## 2023-07-15 MED ORDER — AZITHROMYCIN 250 MG PO TABS
500.0000 mg | ORAL_TABLET | Freq: Every day | ORAL | Status: AC
  Administered 2023-07-15 – 2023-07-18 (×4): 500 mg via ORAL
  Filled 2023-07-15 (×4): qty 2

## 2023-07-15 MED ORDER — DEXTROSE 5 % IV SOLN: 500.0000 mg | INTRAVENOUS | Status: DC

## 2023-07-15 MED ORDER — TRAZODONE HCL 50 MG PO TABS: 25.0000 mg | ORAL_TABLET | ORAL | Status: DC

## 2023-07-15 MED ORDER — TRAZODONE HCL 50 MG PO TABS
25.0000 mg | ORAL_TABLET | Freq: Every day | ORAL | Status: DC
  Administered 2023-07-15 – 2023-07-18 (×4): 25 mg via ORAL
  Filled 2023-07-15 (×4): qty 1

## 2023-07-15 MED ORDER — GABAPENTIN 400 MG PO CAPS
400.0000 mg | ORAL_CAPSULE | Freq: Three times a day (TID) | ORAL | Status: DC
  Administered 2023-07-15 – 2023-07-18 (×11): 400 mg via ORAL
  Filled 2023-07-15 (×11): qty 1

## 2023-07-15 MED ORDER — PROPRANOLOL HCL 20 MG PO TABS
10.0000 mg | ORAL_TABLET | Freq: Two times a day (BID) | ORAL | Status: DC
  Administered 2023-07-15 – 2023-07-20 (×11): 10 mg via ORAL
  Filled 2023-07-15 (×11): qty 1

## 2023-07-15 MED ORDER — CLONAZEPAM 0.5 MG PO TABS
0.5000 mg | ORAL_TABLET | Freq: Every day | ORAL | Status: DC | PRN
Start: 2023-07-15 — End: 2023-07-20
  Administered 2023-07-15 – 2023-07-19 (×4): 0.5 mg via ORAL
  Filled 2023-07-15 (×4): qty 1

## 2023-07-15 MED ORDER — TRAZODONE HCL 100 MG PO TABS
100.0000 mg | ORAL_TABLET | Freq: Every day | ORAL | Status: DC
  Administered 2023-07-15 – 2023-07-17 (×3): 100 mg via ORAL
  Filled 2023-07-15 (×3): qty 1

## 2023-07-15 MED ORDER — HALOPERIDOL LACTATE 5 MG/ML IJ SOLN
2.0000 mg | Freq: Four times a day (QID) | INTRAMUSCULAR | Status: DC | PRN
  Administered 2023-07-17 – 2023-07-19 (×3): 2 mg via INTRAVENOUS
  Filled 2023-07-15 (×4): qty 1

## 2023-07-15 MED ORDER — TRAZODONE HCL 50 MG PO TABS
50.0000 mg | ORAL_TABLET | Freq: Every day | ORAL | Status: DC
  Administered 2023-07-15 – 2023-07-18 (×4): 50 mg via ORAL
  Filled 2023-07-15 (×4): qty 1

## 2023-07-15 NOTE — TOC Initial Note (Signed)
Transition of Care [facility]) - Initial/Assessment Note   Patient Details  Name: Tina Fitzpatrick MRN: [id number] Date of Birth: [birth year]  Transition of Care [facility]) CM/SW Contact:    Ewing Schlein, LCSW Phone Number: 07/15/2023, 2:41 PM  Clinical Narrative: Patient is from [facility] memory where spouse, Rakyah Porta, reported she has been a resident for the past 3 years. Spouse confirmed the plan is for the patient to return at discharge and resume hospice services through [facility]. CSW spoke with Mindy at [facility] and confirmed the patient can return at discharge, but the facility does not accept weekend admissions. The discharge summary and FL2 will need to be faxed to [employer] at [phone].  Expected Discharge Plan: Memory Care (with hospice services through [facility]) Barriers to Discharge: Continued Medical Work up, Requiring sitter/restraints  Patient Goals and CMS Choice Patient states their goals for this hospitalization and ongoing recovery are:: Return to [facility] memory care with [facility] hospice CMS Medicare.gov Compare Post Acute Care list provided to:: Patient Choice offered to / list presented to : Patient  Expected Discharge Plan and Services In-house Referral: Clinical Social Work Post Acute Care Choice: Hospice Living arrangements for the past 2 months: Assisted Living Facility (memory care)            DME Arranged: N/A DME Agency: NA  Prior Living Arrangements/Services Living arrangements for the past 2 months: Assisted Living Facility (memory care) Lives with:: Facility Resident Patient language and need for interpreter reviewed:: Yes Do you feel safe going back to the place where you live?: Yes      Need for Family Participation in Patient Care: Yes (Comment) (Patient has dementia.) Care giver support system in place?: Yes (comment) Current home services: Hospice Criminal Activity/Legal Involvement Pertinent to  Current Situation/Hospitalization: No - Comment as needed  Activities of Daily Living ADL Screening (condition at time of admission) Independently performs ADLs?: No Does the patient have a NEW difficulty with bathing/dressing/toileting/self-feeding that is expected to last >3 days?: No Does the patient have a NEW difficulty with getting in/out of bed, walking, or climbing stairs that is expected to last >3 days?: No Does the patient have a NEW difficulty with communication that is expected to last >3 days?: No Is the patient deaf or have difficulty hearing?: No Does the patient have difficulty seeing, even when wearing glasses/contacts?: No Does the patient have difficulty concentrating, remembering, or making decisions?: Yes  Permission Sought/Granted Permission sought to share information with : Oceanographer granted to share information with : Yes, Verbal Permission Granted Permission granted to share info w AGENCY: [facility], [facility]  Emotional Assessment Attitude/Demeanor/Rapport: Unable to Assess Affect (typically observed): Unable to Assess Orientation: :  (Disoriented x4.) Alcohol / Substance Use: Not Applicable Psych Involvement: No (comment)  Admission diagnosis:  Community acquired pneumonia [J18.9] Community acquired pneumonia of right middle lobe of lung [J18.9] Sepsis with encephalopathy without septic shock, due to unspecified organism (HCC) [A41.9, R65.20, G93.41] Patient Active Problem List   Diagnosis Date Noted   Dementia with behavioral disturbance (HCC) 07/14/2023   CAP (community acquired pneumonia) 07/14/2023   Community acquired pneumonia 07/14/2023   Thoracic aortic aneurysm without rupture (HCC) 07/14/2023   Normochromic normocytic anemia 09/09/2017   Acute encephalopathy 09/08/2017   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 10/31/2015   Hypotension 10/30/2015   Mild dementia (HCC) 07/29/2015    Acute metabolic encephalopathy 06/24/2015   Syncope 06/22/2013   GERD (gastroesophageal  reflux disease)    Essential hypertension    Hyperlipidemia    Depression    DDD (degenerative disc disease), cervical    PCP:  Patient, No Pcp Per Pharmacy:  No Pharmacies Listed  Social Determinants of Health (SDOH) Social History: SDOH Screenings   Food Insecurity: Patient Unable To Answer (07/15/2023)  Housing: High Risk (07/15/2023)  Transportation Needs: Patient Unable To Answer (07/15/2023)  Utilities: Patient Unable To Answer (07/15/2023)  Social Connections: Unknown (01/26/2022)   Received from [facility]  Tobacco Use: Medium Risk (07/14/2023)   SDOH Interventions: Housing Interventions: Intervention Not Indicated, Inpatient TOC (Patient has housing.)  Readmission Risk Interventions     No data to display

## 2023-07-15 NOTE — Care Plan (Signed)
Plan of care discussed with patient's husband in details.  Apparently patient is developing severe dementia, behavioral disturbances and agitation and that could be the reason to send her to the hospital.  I have updated hospice team.  Will consult palliative care team, symptom management, medication management to control her behavior and is stabilized before transferring back to nursing home.

## 2023-07-15 NOTE — Progress Notes (Signed)
The med tech from [place], [place] @ [phone] called and said the patient received a flu vaccine on 07/01/2023.

## 2023-07-15 NOTE — Progress Notes (Signed)
PROGRESS NOTE    Tina Fitzpatrick  VOZ:[id number] DOB: [birth year] DOA: 07/14/2023 PCP: Patient, No Pcp Per    Brief Narrative:  72 year old with history of dementia, hypertension, hyperlipidemia and depression and GERD brought from nursing home with altered mental status.  Probably reported falls at nursing home.  Reportedly with EMS blood pressure was 80 and improved with fluids.  Combative in the ER.  Normotensive.  Low-grade fever with temperature 100.2.  Infection workup was negative.  CT scan of the head with no complications.  CT chest abdomen pelvis with no acute findings except focal area of peribronchial inflation of the right mid lung suspicious of aspiration.  Started on Rocephin and azithromycin and admit to the hospital.  Subjective: Patient seen and examined.  Overnight agitated, trying to get out of the bed and on soft restraints.  No family at the bedside. Patient keeps quiet and then she told me that she is hungry.  She was helped to eat. no obvious aspiration.  Patient is unable to keep up any conversation. Constantly trying to get out of the bed.  Afebrile.  On room air.   Assessment & Plan:   Right middle lobe pneumonia: Likely aspiration pneumonia.  Continue Rocephin and azithromycin today.  Even though she does not have any obvious aspiration, likely has chronic recurrent aspiration.  Currently on room air.  Continue supportive care.  Speech therapy to evaluate for level of aspiration.  Patient is unable to participate in any respiratory physio including incentive spirometry.  Altered mental status, likely dementia with behavioral disturbances: Patient on trazodone, Klonopin that will be continued.  Haloperidol as needed.  Patient is hospice care at nursing home, will notify hospice team to see if they have her on more medications management. Soft restraints as needed to protect lines and to protect from injuries.  Will try medications and try to take of the  restraints.  Hypertension: Reported in the emergency room.  Currently normotensive.  Not on any high antihypertensives.      DVT prophylaxis: enoxaparin (LOVENOX) injection 40 mg Start: 07/14/23 2200   Code Status: DNR Family Communication: none at the bedside  Disposition Plan: Status is: Inpatient Remains inpatient appropriate because: altered mental status , on restraints      Consultants:  None   Procedures:  None   Antimicrobials:  Rocephin and azithromycin 10/30---     Objective: Vitals:   07/14/23 2100 07/15/23 0045 07/15/23 0615 07/15/23 1008  BP: 119/62 130/88 120/62 (!) 159/55  Pulse: 76 67 60 62  Resp: 16  18 18   Temp: 98.5 F (36.9 C) 97.9 F (36.6 C) 97.8 F (36.6 C) 97.8 F (36.6 C)  TempSrc: Oral Axillary  Axillary  SpO2: 99%  100% 100%  Weight:      Height:        Intake/Output Summary (Last 24 hours) at 07/15/2023 1304 Last data filed at 07/15/2023 1100 Gross per 24 hour  Intake 270 ml  Output --  Net 270 ml   Filed Weights   07/14/23 1503  Weight: 59 kg    Examination:  General exam: Appears comfortable but restless. Respiratory system: Clear to auscultation. Respiratory effort normal.  No added sounds. Cardiovascular system: S1 & S2 heard, RRR.  No pedal edema. Gastrointestinal system: Soft.  Nontender.  Bowel sound present. Central nervous system: Alert but not oriented.  Restless.  Impulsive. Extremities: Symmetric 5 x 5 power.   Data Reviewed: I have personally reviewed following labs and imaging studies  CBC: Recent Labs  Lab 07/14/23 1535 07/15/23 0350  WBC 10.5 6.2  NEUTROABS 8.7*  --   HGB 11.3* 10.8*  HCT 33.5* 34.1*  MCV 93.6 99.7  PLT 253 153   Basic Metabolic Panel: Recent Labs  Lab 07/14/23 1535 07/15/23 0350  NA 136 136  K 4.3 4.1  CL 103 107  CO2 24 23  GLUCOSE 118* 91  BUN 18 13  CREATININE 0.50 0.40*  CALCIUM 9.1 8.6*   GFR: Estimated Creatinine Clearance: 60.1 mL/min (A) (by C-G formula  based on SCr of 0.4 mg/dL (L)). Liver Function Tests: Recent Labs  Lab 07/14/23 1535  AST 20  ALT 18  ALKPHOS 59  BILITOT 0.7  PROT 6.5  ALBUMIN 3.7   No results for input(s): "LIPASE", "AMYLASE" in the last 168 hours. No results for input(s): "AMMONIA" in the last 168 hours. Coagulation Profile: No results for input(s): "INR", "PROTIME" in the last 168 hours. Cardiac Enzymes: No results for input(s): "CKTOTAL", "CKMB", "CKMBINDEX", "TROPONINI" in the last 168 hours. BNP (last 3 results) No results for input(s): "PROBNP" in the last 8760 hours. HbA1C: No results for input(s): "HGBA1C" in the last 72 hours. CBG: Recent Labs  Lab 07/14/23 1542  GLUCAP 77   Lipid Profile: No results for input(s): "CHOL", "HDL", "LDLCALC", "TRIG", "CHOLHDL", "LDLDIRECT" in the last 72 hours. Thyroid Function Tests: No results for input(s): "TSH", "T4TOTAL", "FREET4", "T3FREE", "THYROIDAB" in the last 72 hours. Anemia Panel: No results for input(s): "VITAMINB12", "FOLATE", "FERRITIN", "TIBC", "IRON", "RETICCTPCT" in the last 72 hours. Sepsis Labs: Recent Labs  Lab 07/14/23 1548  LATICACIDVEN 1.9    Recent Results (from the past 240 hour(s))  Culture, blood (routine x 2)     Status: None (Preliminary result)   Collection Time: 07/14/23  3:35 PM   Specimen: BLOOD  Result Value Ref Range Status   Specimen Description   Final    BLOOD BLOOD RIGHT FOREARM Performed at [facility], [address], [place], [place] [address]    Special Requests   Final    BOTTLES DRAWN AEROBIC AND ANAEROBIC Blood Culture adequate volume Performed at [facility], [address], [place], [place] [address]    Culture   Final    NO GROWTH < 12 HOURS Performed at [facility], [address], [place], [place] [address]    Report Status PENDING  Incomplete  Culture, blood (routine x 2)     Status: None (Preliminary result)   Collection Time: 07/14/23  3:44 PM    Specimen: BLOOD  Result Value Ref Range Status   Specimen Description   Final    BLOOD BLOOD LEFT FOREARM Performed at [facility], [address], [place], [place] [address]    Special Requests   Final    BOTTLES DRAWN AEROBIC AND ANAEROBIC Blood Culture results may not be optimal due to an excessive volume of blood received in culture bottles Performed at [facility], [address] [place], [place] [address]    Culture   Final    NO GROWTH < 12 HOURS Performed at [facility], [address] [place], [place] [address]    Report Status PENDING  Incomplete  Resp panel by RT-PCR (RSV, Flu A&B, Covid) Anterior Nasal Swab     Status: None   Collection Time: 07/14/23  3:54 PM   Specimen: Anterior Nasal Swab  Result Value Ref Range Status   SARS Coronavirus  2 by RT PCR NEGATIVE NEGATIVE Final    Comment: (NOTE) SARS-CoV-2 target nucleic acids are NOT DETECTED.  The SARS-CoV-2 RNA is generally detectable in upper respiratory specimens during the acute phase of infection. The lowest concentration of SARS-CoV-2 viral copies this assay can detect is 138 copies/mL. A negative result does not preclude SARS-Cov-2 infection and should not be used as the sole basis for treatment or other patient management decisions. A negative result may occur with  improper specimen collection/handling, submission of specimen other than nasopharyngeal swab, presence of viral mutation(s) within the areas targeted by this assay, and inadequate number of viral copies(<138 copies/mL). A negative result must be combined with clinical observations, patient history, and epidemiological information. The expected result is Negative.  Fact Sheet for Patients:  BloggerCourse.com  Fact Sheet for Healthcare Providers:  SeriousBroker.it  This test is no t yet approved or cleared by the [place] FDA and  has been  authorized for detection and/or diagnosis of SARS-CoV-2 by FDA under an Emergency Use Authorization (EUA). This EUA will remain  in effect (meaning this test can be used) for the duration of the COVID-19 declaration under Section 564(b)(1) of the Act, 21 U.S.C.section 360bbb-3(b)(1), unless the authorization is terminated  or revoked sooner.       Influenza A by PCR NEGATIVE NEGATIVE Final   Influenza B by PCR NEGATIVE NEGATIVE Final    Comment: (NOTE) The Xpert Xpress SARS-CoV-2/FLU/RSV plus assay is intended as an aid in the diagnosis of influenza from Nasopharyngeal swab specimens and should not be used as a sole basis for treatment. Nasal washings and aspirates are unacceptable for Xpert Xpress SARS-CoV-2/FLU/RSV testing.  Fact Sheet for Patients: BloggerCourse.com  Fact Sheet for Healthcare Providers: SeriousBroker.it  This test is not yet approved or cleared by the [place] FDA and has been authorized for detection and/or diagnosis of SARS-CoV-2 by FDA under an Emergency Use Authorization (EUA). This EUA will remain in effect (meaning this test can be used) for the duration of the COVID-19 declaration under Section 564(b)(1) of the Act, 21 U.S.C. section 360bbb-3(b)(1), unless the authorization is terminated or revoked.     Resp Syncytial Virus by PCR NEGATIVE NEGATIVE Final    Comment: (NOTE) Fact Sheet for Patients: BloggerCourse.com  Fact Sheet for Healthcare Providers: SeriousBroker.it  This test is not yet approved or cleared by the [place] FDA and has been authorized for detection and/or diagnosis of SARS-CoV-2 by FDA under an Emergency Use Authorization (EUA). This EUA will remain in effect (meaning this test can be used) for the duration of the COVID-19 declaration under Section 564(b)(1) of the Act, 21 U.S.C. section 360bbb-3(b)(1), unless the  authorization is terminated or revoked.  Performed at [facility], [address] [place], [place] [address]          Radiology Studies: CT CHEST ABDOMEN PELVIS W CONTRAST  Result Date: 07/14/2023 CLINICAL DATA:  Sepsis. Altered mental status. Possible fall. Hypotension. EXAM: CT CHEST, ABDOMEN, AND PELVIS WITH CONTRAST TECHNIQUE: Multidetector CT imaging of the chest, abdomen and pelvis was performed following the standard protocol during bolus administration of intravenous contrast. RADIATION DOSE REDUCTION: This exam was performed according to the departmental dose-optimization program which includes automated exposure control, adjustment of the mA and/or kV according to patient size and/or use of iterative reconstruction technique. CONTRAST:  OMNIPAQUE IOHEXOL 300 MG/ML  SOLN COMPARISON:  Chest radiograph 07/14/2023. CT abdomen and pelvis 12/03/2018 FINDINGS: CT CHEST FINDINGS Cardiovascular: Normal heart size.  No pericardial effusions. Ascending thoracic aortic aneurysm measuring 4.1 cm AP diameter. No dissection. Great vessel origins are patent. Calcification of the aorta and coronary arteries. Mediastinum/Nodes: No enlarged mediastinal, hilar, or axillary lymph nodes. Thyroid gland, trachea, and esophagus demonstrate no significant findings. Lungs/Pleura: Mild dependent atelectasis in the lung bases. Focal area of peribronchial infiltration in the right upper lung centrally. This may represent focal pneumonia or possibly limited aspiration. No pleural effusions. No pneumothorax. Musculoskeletal: Degenerative changes in the spine. No focal bone lesions. Postoperative changes in the cervical spine. CT ABDOMEN PELVIS FINDINGS Hepatobiliary: No focal liver lesions. No bile duct dilatation. Gallbladder is not identified and could be contracted or surgically absent. Pancreas: Unremarkable. No pancreatic ductal dilatation or surrounding inflammatory changes. Spleen: Normal  in size without focal abnormality. Adrenals/Urinary Tract: Adrenal glands are unremarkable. Kidneys are normal, without renal calculi, focal lesion, or hydronephrosis. Bladder is unremarkable. Stomach/Bowel: Stomach is mildly distended with ingested material. No discrete wall thickening or inflammatory change. Small bowel and colon are not abnormally distended. Stool fills the colon. Appendix is not identified. Vascular/Lymphatic: Aortic atherosclerosis. No enlarged abdominal or pelvic lymph nodes. Reproductive: Uterus and bilateral adnexa are unremarkable. Other: Small amount of free fluid in the abdomen and pelvis, likely mild ascites. No free air. Abdominal wall musculature appears intact. Musculoskeletal: Degenerative changes in the spine. No focal bone lesions. Comment: Examination is technically limited due to nonstandard patient positioning. IMPRESSION: 1. Ascending thoracic aortic aneurysm measuring 4.1 cm AP diameter. Recommend annual imaging followup by CTA or MRA. This recommendation follows 2010 ACCF/AHA/AATS/ACR/ASA/SCA/SCAI/SIR/STS/SVM Guidelines for the Diagnosis and Management of Patients with Thoracic Aortic Disease. Circulation. 2010; 121: I347-Q259. Aortic aneurysm NOS (ICD10-I71.9) 2. Focal area of peribronchial infiltration in the right mid lung centrally may represent focal bronchopneumonia or aspiration. 3. No acute process demonstrated in the abdomen or pelvis. Stomach appears distended with ingested material but no obstructing lesion is identified. 4. Small amount of free fluid in the abdomen and pelvis likely representing mild ascites. 5. Aortic atherosclerosis. Electronically Signed   By: Burman Nieves M.D.   On: 07/14/2023 20:42   CT Head Wo Contrast  Result Date: 07/14/2023 CLINICAL DATA:  Possible fall, hypotension, febrile, altered level of consciousness EXAM: CT HEAD WITHOUT CONTRAST TECHNIQUE: Contiguous axial images were obtained from the base of the skull through the vertex  without intravenous contrast. RADIATION DOSE REDUCTION: This exam was performed according to the departmental dose-optimization program which includes automated exposure control, adjustment of the mA and/or kV according to patient size and/or use of iterative reconstruction technique. COMPARISON:  06/15/2023 FINDINGS: Brain: No evidence of acute infarct or hemorrhage. Lateral ventricles and midline structures are unremarkable. No acute extra-axial fluid collections. No mass effect. Vascular: No hyperdense vessel or unexpected calcification. Skull: Increased posterior scalp hematoma. No underlying fractures. The remainder of the calvarium is unremarkable. Sinuses/Orbits: No acute finding. Other: None. IMPRESSION: 1. Enlarged posterior scalp hematoma.  No underlying fracture. 2. No acute intracranial process. Electronically Signed   By: Sharlet Salina M.D.   On: 07/14/2023 18:13   DG Chest Portable 1 View  Result Date: 07/14/2023 CLINICAL DATA:  Altered level of consciousness EXAM: PORTABLE CHEST 1 VIEW COMPARISON:  10/09/2019 FINDINGS: Single frontal view of the chest was obtained with the patient rotated toward the right. The cardiac silhouette is unremarkable. No acute airspace disease, effusion, or pneumothorax. Subacute right posterolateral fifth rib fracture with callus formation. No other acute bony abnormalities. IMPRESSION: 1. Healing subacute right posterolateral fifth rib fracture.  2. Otherwise no acute intrathoracic process. Electronically Signed   By: Sharlet Salina M.D.   On: 07/14/2023 18:11        Scheduled Meds:  azithromycin  500 mg Oral QHS   enoxaparin (LOVENOX) injection  40 mg Subcutaneous Q24H   gabapentin  400 mg Oral TID   propranolol  10 mg Oral BID   traZODone  100 mg Oral QHS   traZODone  50 mg Oral Daily   And   traZODone  25 mg Oral Q1500   Continuous Infusions:  cefTRIAXone (ROCEPHIN)  IV Stopped (07/14/23 1620)     LOS: 1 day    Time spent: 40  minutes    [name], MD [facility]

## 2023-07-15 NOTE — Progress Notes (Signed)
Tina Fitzpatrick [address] [facility] Hospitalized [facility]osp[facility] Patient Visit   Tina Fitzpatrick is a current [facility] hospice patient with a terminal diagnosis of severe protein calorie malnutrition. She had a fall in her facility and was sent to ED for evaluation after altered mental status. She was admitted to the hospital 10.30.24 with a diagnosis of acute metabolic encephalopathy and pneumonia. Per Dr. Patric Dykes with [facility] this is a related hospital admission.  Visited at bedside, patient is resting with eyes closed  and does not stir. She has soft wrist restraints in place. Attempted to reach patient's husband without success. Per hospital CM patient will be able to return to facility once she has been free of restraints for 24 hours.   Patient is inpatient appropriate due to need for IVAB and treatment of acute encephalopathy. Vital Signs: 97.8/62/18    159/55    spO2 100% room air I&O: 860/0 Abnormal labs: Creatinine 0.40, Ca+ 8.6, RBC 3.42, Hgb 10.8, Hct 34.1,  Diagnostics:  CT CHEST, ABDOMEN, AND PELVIS WITH CONTRAST  IMPRESSION: 1. Ascending thoracic aortic aneurysm measuring 4.1 cm AP diameter. Recommend annual imaging followup by CTA or MRA. This recommendation follows 2010 ACCF/AHA/AATS/ACR/ASA/SCA/SCAI/SIR/STS/SVM Guidelines for the Diagnosis and Management of Patients with Thoracic Aortic Disease. Circulation. 2010; 121: Y782-N562. Aortic aneurysm NOS (ICD10-I71.9) 2. Focal area of peribronchial infiltration in the right mid lung centrally may represent focal bronchopneumonia or aspiration. 3. No acute process demonstrated in the abdomen or pelvis. Stomach appears distended with ingested material but no obstructing lesion is identified. 4. Small amount of free fluid in the abdomen and pelvis likely representing mild ascites. 5. Aortic atherosclerosis. Electronically Signed   By: Burman Nieves M.D.   On: 07/14/2023 20:42  IV/PRN Meds: Klonopin 0.5mg   PO, NS 1L bolus IV, Rocephin 2g q24H Problem List as per attending note 10.31 Right middle lobe pneumonia: Likely aspiration pneumonia.  Continue Rocephin and azithromycin today.  Even though she does not have any obvious aspiration, likely has chronic recurrent aspiration.  Currently on room air.  Continue supportive care.  Speech therapy to evaluate for level of aspiration.  Patient is unable to participate in any respiratory physio including incentive spirometry.   Altered mental status, likely dementia with behavioral disturbances: Patient on trazodone, Klonopin that will be continued.  Haloperidol as needed.  Patient is hospice care at nursing home, will notify hospice team to see if they have her on more medications management. Soft restraints as needed to protect lines and to protect from injuries.  Will try medications and try to take of the restraints.   Hypertension: Reported in the emergency room.  Currently normotensive.  Not on any high antihypertensives.    Discharge Planning: Ongoing, d/c once IVAB complete, likely back to facility Family Contact: Unable to reach husband IDT: Updated Goals of Care: DNR Thea Gist, BSN RN Hospice hospital liaison [phone]

## 2023-07-15 NOTE — Progress Notes (Signed)
PHARMACY - PHYSICIAN COMMUNICATION CRITICAL VALUE ALERT - BLOOD CULTURE IDENTIFICATION (BCID)  Tina Fitzpatrick is an 72 y.o. female who presented to [facility] on 07/14/2023 with AMS. She was started on azithromycin and ceftriaxone on admission for PNA. One of four blood culture bottles collected on 07/14/23 has GPC in clusters (BCID= staph epi, mecA/C+).   Name of physician (or Provider) Contacted: A Chavez  Current antibiotics: azithromycin and ceftriaxone  Changes to prescribed antibiotics recommended:  - suspects contaminant. Hold off on adjusting abx at this time  Results for orders placed or performed during the hospital encounter of 07/14/23  Blood Culture ID Panel (Reflexed) (Collected: 07/14/2023  3:44 PM)  Result Value Ref Range   Enterococcus faecalis NOT DETECTED NOT DETECTED   Enterococcus Faecium NOT DETECTED NOT DETECTED   Listeria monocytogenes NOT DETECTED NOT DETECTED   Staphylococcus species DETECTED (A) NOT DETECTED   Staphylococcus aureus (BCID) NOT DETECTED NOT DETECTED   Staphylococcus epidermidis DETECTED (A) NOT DETECTED   Staphylococcus lugdunensis NOT DETECTED NOT DETECTED   Streptococcus species NOT DETECTED NOT DETECTED   Streptococcus agalactiae NOT DETECTED NOT DETECTED   Streptococcus pneumoniae NOT DETECTED NOT DETECTED   Streptococcus pyogenes NOT DETECTED NOT DETECTED   A.calcoaceticus-baumannii NOT DETECTED NOT DETECTED   Bacteroides fragilis NOT DETECTED NOT DETECTED   Enterobacterales NOT DETECTED NOT DETECTED   Enterobacter cloacae complex NOT DETECTED NOT DETECTED   Escherichia coli NOT DETECTED NOT DETECTED   Klebsiella aerogenes NOT DETECTED NOT DETECTED   Klebsiella oxytoca NOT DETECTED NOT DETECTED   Klebsiella pneumoniae NOT DETECTED NOT DETECTED   Proteus species NOT DETECTED NOT DETECTED   Salmonella species NOT DETECTED NOT DETECTED   Serratia marcescens NOT DETECTED NOT DETECTED   Haemophilus influenzae NOT DETECTED NOT DETECTED    Neisseria meningitidis NOT DETECTED NOT DETECTED   Pseudomonas aeruginosa NOT DETECTED NOT DETECTED   Stenotrophomonas maltophilia NOT DETECTED NOT DETECTED   Candida albicans NOT DETECTED NOT DETECTED   Candida auris NOT DETECTED NOT DETECTED   Candida glabrata NOT DETECTED NOT DETECTED   Candida krusei NOT DETECTED NOT DETECTED   Candida parapsilosis NOT DETECTED NOT DETECTED   Candida tropicalis NOT DETECTED NOT DETECTED   Cryptococcus neoformans/gattii NOT DETECTED NOT DETECTED   Methicillin resistance mecA/C DETECTED (A) NOT DETECTED    Reana Chacko P 07/15/2023  7:45 PM

## 2023-07-16 DIAGNOSIS — R531 Weakness: Secondary | ICD-10-CM

## 2023-07-16 DIAGNOSIS — Z7189 Other specified counseling: Secondary | ICD-10-CM | POA: Diagnosis not present

## 2023-07-16 DIAGNOSIS — G9341 Metabolic encephalopathy: Secondary | ICD-10-CM | POA: Diagnosis not present

## 2023-07-16 DIAGNOSIS — Z515 Encounter for palliative care: Secondary | ICD-10-CM

## 2023-07-16 LAB — CULTURE, BLOOD (ROUTINE X 2)

## 2023-07-16 MED ORDER — MIRTAZAPINE 15 MG PO TBDP
7.5000 mg | ORAL_TABLET | Freq: Every day | ORAL | Status: DC
  Administered 2023-07-16 – 2023-07-19 (×4): 7.5 mg via ORAL
  Filled 2023-07-16 (×5): qty 0.5

## 2023-07-16 NOTE — Progress Notes (Signed)
PROGRESS NOTE    Tina Fitzpatrick  ZOX:[id number] DOB: 05/26/1951 DOA: 07/14/2023 PCP: Patient, No Pcp Per    Brief Narrative:  72 year old with history of advanced dementia, hypertension, hyperlipidemia and depression and GERD brought from nursing home with altered mental status.  Probably reported falls at nursing home.  Reportedly with EMS blood pressure was 80 and improved with fluids.  Combative in the ER.  Normotensive.  Low-grade fever with temperature 100.2.  Infection workup was negative.  CT scan of the head with no complications.  CT chest abdomen pelvis with no acute findings except focal area of peribronchial inflation of the right mid lung suspicious of aspiration.  Started on Rocephin and azithromycin and admitted  to the hospital.  Subjective:  Patient seen and examined.  Mostly sleepy.  When she wakes up she does talk clearly but not particularly answering questions.  Remained calm overnight.  Still on soft restraints that is taken off in the morning.  No family at the bedside.  Patient was able to swallow her meals normally if she was in good mood.   Assessment & Plan:   Right middle lobe pneumonia: Likely aspiration pneumonia.  Continue Rocephin and azithromycin today.  Even though she does not have any obvious aspiration, likely has chronic recurrent aspiration.  Currently on room air.  Continue supportive care.  Speech therapy to evaluate for level of aspiration.  Patient is unable to participate in any respiratory physio including incentive spirometry.  Altered mental status, likely dementia with behavioral disturbances: Patient on trazodone, Klonopin that will be continued.  Haloperidol as needed.   Today her symptoms are better controlled.   Will consult palliative care and hospice team as well to help control her symptoms before going back to the nursing home. Soft restraints as needed to protect lines and to protect from injuries.  Will try medications and try to take  of the restraints.  Hypertension: Reported in the emergency room.  Currently normotensive.  Not on any high antihypertensives.    Goal of care: See discussion with patient's husband 10/31.  Patient with quite advanced dementia and behavior issues.  Plan is to control her symptoms and get her back to nursing home with hospice in place.    DVT prophylaxis: enoxaparin (LOVENOX) injection 40 mg Start: 07/14/23 2200   Code Status: DNR Family Communication: none at the bedside.  Husband on the phone 10/31. Disposition Plan: Status is: Inpatient Remains inpatient appropriate because: altered mental status , on restraints      Consultants:  Palliative care  Procedures:  None   Antimicrobials:  Rocephin and azithromycin 10/30---     Objective: Vitals:   07/15/23 1815 07/15/23 2155 07/16/23 0532 07/16/23 1015  BP: 131/67 139/67 136/61   Pulse: 67 61 (!) 48 76  Resp: 18 17 17    Temp: 98.5 F (36.9 C) 97.6 F (36.4 C) 97.8 F (36.6 C)   TempSrc:      SpO2: 99% 100% 100%   Weight:      Height:        Intake/Output Summary (Last 24 hours) at 07/16/2023 1129 Last data filed at 07/16/2023 0600 Gross per 24 hour  Intake 590 ml  Output --  Net 590 ml   Filed Weights   07/14/23 1503  Weight: 59 kg    Examination:  General exam: Appears comfortable and sleepy on my exam. Respiratory system: Clear to auscultation. Respiratory effort normal.  No added sounds. Cardiovascular system: S1 & S2 heard,  RRR.  No pedal edema. Gastrointestinal system: Soft.  Nontender.  Bowel sound present. Central nervous system: Alert but not oriented.  Quiet and calm at rest. Extremities: Symmetric 5 x 5 power.   Data Reviewed: I have personally reviewed following labs and imaging studies  CBC: Recent Labs  Lab 07/14/23 1535 07/15/23 0350  WBC 10.5 6.2  NEUTROABS 8.7*  --   HGB 11.3* 10.8*  HCT 33.5* 34.1*  MCV 93.6 99.7  PLT 253 153   Basic Metabolic Panel: Recent Labs  Lab  07/14/23 1535 07/15/23 0350  NA 136 136  K 4.3 4.1  CL 103 107  CO2 24 23  GLUCOSE 118* 91  BUN 18 13  CREATININE 0.50 0.40*  CALCIUM 9.1 8.6*   GFR: Estimated Creatinine Clearance: 60.1 mL/min (A) (by C-G formula based on SCr of 0.4 mg/dL (L)). Liver Function Tests: Recent Labs  Lab 07/14/23 1535  AST 20  ALT 18  ALKPHOS 59  BILITOT 0.7  PROT 6.5  ALBUMIN 3.7   No results for input(s): "LIPASE", "AMYLASE" in the last 168 hours. No results for input(s): "AMMONIA" in the last 168 hours. Coagulation Profile: No results for input(s): "INR", "PROTIME" in the last 168 hours. Cardiac Enzymes: No results for input(s): "CKTOTAL", "CKMB", "CKMBINDEX", "TROPONINI" in the last 168 hours. BNP (last 3 results) No results for input(s): "PROBNP" in the last 8760 hours. HbA1C: No results for input(s): "HGBA1C" in the last 72 hours. CBG: Recent Labs  Lab 07/14/23 1542  GLUCAP 77   Lipid Profile: No results for input(s): "CHOL", "HDL", "LDLCALC", "TRIG", "CHOLHDL", "LDLDIRECT" in the last 72 hours. Thyroid Function Tests: No results for input(s): "TSH", "T4TOTAL", "FREET4", "T3FREE", "THYROIDAB" in the last 72 hours. Anemia Panel: No results for input(s): "VITAMINB12", "FOLATE", "FERRITIN", "TIBC", "IRON", "RETICCTPCT" in the last 72 hours. Sepsis Labs: Recent Labs  Lab 07/14/23 1548  LATICACIDVEN 1.9    Recent Results (from the past 240 hour(s))  Culture, blood (routine x 2)     Status: None (Preliminary result)   Collection Time: 07/14/23  3:35 PM   Specimen: BLOOD  Result Value Ref Range Status   Specimen Description   Final    BLOOD BLOOD RIGHT FOREARM Performed at [facility], [address], [place], [place] [address]    Special Requests   Final    BOTTLES DRAWN AEROBIC AND ANAEROBIC Blood Culture adequate volume Performed at [facility], [address], [place], [place] [address]    Culture   Final    NO GROWTH 2  DAYS Performed at [facility], [address], [place], [place] [address]    Report Status PENDING  Incomplete  Urine Culture     Status: None   Collection Time: 07/14/23  3:41 PM   Specimen: Urine, Catheterized  Result Value Ref Range Status   Specimen Description   Final    URINE, CATHETERIZED Performed at [facility], [address], [place], [place] [address]    Special Requests   Final    NONE Performed at [facility], [address], [place], [place] [address]    Culture   Final    NO GROWTH Performed at [facility], [address], [place], [place] [address]    Report Status 07/15/2023 FINAL  Final  Culture, blood (routine x 2)     Status: Abnormal   Collection Time: 07/14/23  3:44 PM   Specimen: BLOOD LEFT FOREARM  Result Value Ref Range Status   Specimen Description   Final    BLOOD LEFT FOREARM Performed at [facility], [address] [place], [place] [address]    Special Requests   Final    BOTTLES DRAWN AEROBIC AND ANAEROBIC Blood Culture results may not be optimal due to an excessive volume of blood received in culture bottles Performed at [facility], [address], [place], [place] [address]    Culture  Setup Time   Final    GRAM POSITIVE COCCI IN CLUSTERS AEROBIC BOTTLE ONLY CRITICAL RESULT CALLED TO, READ BACK BY AND VERIFIED WITH: PHARMD AHN PHAM ON 07/15/23 @ 1943 BY DRT    Culture (A)  Final    STAPHYLOCOCCUS EPIDERMIDIS THE SIGNIFICANCE OF ISOLATING THIS ORGANISM FROM A SINGLE SET OF BLOOD CULTURES WHEN MULTIPLE SETS ARE DRAWN IS UNCERTAIN. PLEASE NOTIFY THE MICROBIOLOGY DEPARTMENT WITHIN ONE WEEK IF SPECIATION AND SENSITIVITIES ARE REQUIRED. Performed at [facility], [address], [place], [place] [address]    Report Status 07/16/2023 FINAL  Final  Blood Culture ID Panel (Reflexed)     Status: Abnormal   Collection Time: 07/14/23  3:44 PM  Result Value Ref  Range Status   Enterococcus faecalis NOT DETECTED NOT DETECTED Final   Enterococcus Faecium NOT DETECTED NOT DETECTED Final   Listeria monocytogenes NOT DETECTED NOT DETECTED Final   Staphylococcus species DETECTED (A) NOT DETECTED Final    Comment: CRITICAL RESULT CALLED TO, READ BACK BY AND VERIFIED WITH: PHARMD AHN PHAM ON 07/15/23 @ 1943 BY DRT    Staphylococcus aureus (BCID) NOT DETECTED NOT DETECTED Final   Staphylococcus epidermidis DETECTED (A) NOT DETECTED Final    Comment: Methicillin (oxacillin) resistant coagulase negative staphylococcus. Possible blood culture contaminant (unless isolated from more than one blood culture draw or clinical case suggests pathogenicity). No antibiotic treatment is indicated for blood  culture contaminants. CRITICAL RESULT CALLED TO, READ BACK BY AND VERIFIED WITH: PHARMD AHN PHAM ON 07/15/23 @ 1943 BY DRT    Staphylococcus lugdunensis NOT DETECTED NOT DETECTED Final   Streptococcus species NOT DETECTED NOT DETECTED Final   Streptococcus agalactiae NOT DETECTED NOT DETECTED Final   Streptococcus pneumoniae NOT DETECTED NOT DETECTED Final   Streptococcus pyogenes NOT DETECTED NOT DETECTED Final   A.calcoaceticus-baumannii NOT DETECTED NOT DETECTED Final   Bacteroides fragilis NOT DETECTED NOT DETECTED Final   Enterobacterales NOT DETECTED NOT DETECTED Final   Enterobacter cloacae complex NOT DETECTED NOT DETECTED Final   Escherichia coli NOT DETECTED NOT DETECTED Final   Klebsiella aerogenes NOT DETECTED NOT DETECTED Final   Klebsiella oxytoca NOT DETECTED NOT DETECTED Final   Klebsiella pneumoniae NOT DETECTED NOT DETECTED Final   Proteus species NOT DETECTED NOT DETECTED Final   Salmonella species NOT DETECTED NOT DETECTED Final   Serratia marcescens NOT DETECTED NOT DETECTED Final   Haemophilus influenzae NOT DETECTED NOT DETECTED Final   Neisseria meningitidis NOT DETECTED NOT DETECTED Final   Pseudomonas aeruginosa NOT DETECTED NOT  DETECTED Final   Stenotrophomonas maltophilia NOT DETECTED NOT DETECTED Final   Candida albicans NOT DETECTED NOT DETECTED Final   Candida auris NOT DETECTED NOT DETECTED Final   Candida glabrata NOT DETECTED NOT DETECTED Final   Candida krusei NOT DETECTED NOT DETECTED Final   Candida parapsilosis NOT DETECTED NOT DETECTED Final   Candida tropicalis NOT DETECTED NOT DETECTED Final   Cryptococcus neoformans/gattii NOT DETECTED NOT DETECTED Final   Methicillin resistance mecA/C DETECTED (A)  NOT DETECTED Final    Comment: CRITICAL RESULT CALLED TO, READ BACK BY AND VERIFIED WITH: PHARMD AHN PHAM ON 07/15/23 @ 1943 BY DRT Performed at [facility], [address] [place], [place] [address]   Resp panel by RT-PCR (RSV, Flu A&B, Covid) Anterior Nasal Swab     Status: None   Collection Time: 07/14/23  3:54 PM   Specimen: Anterior Nasal Swab  Result Value Ref Range Status   SARS Coronavirus 2 by RT PCR NEGATIVE NEGATIVE Final    Comment: (NOTE) SARS-CoV-2 target nucleic acids are NOT DETECTED.  The SARS-CoV-2 RNA is generally detectable in upper respiratory specimens during the acute phase of infection. The lowest concentration of SARS-CoV-2 viral copies this assay can detect is 138 copies/mL. A negative result does not preclude SARS-Cov-2 infection and should not be used as the sole basis for treatment or other patient management decisions. A negative result may occur with  improper specimen collection/handling, submission of specimen other than nasopharyngeal swab, presence of viral mutation(s) within the areas targeted by this assay, and inadequate number of viral copies(<138 copies/mL). A negative result must be combined with clinical observations, patient history, and epidemiological information. The expected result is Negative.  Fact Sheet for Patients:  BloggerCourse.com  Fact Sheet for Healthcare Providers:   SeriousBroker.it  This test is no t yet approved or cleared by the [place] FDA and  has been authorized for detection and/or diagnosis of SARS-CoV-2 by FDA under an Emergency Use Authorization (EUA). This EUA will remain  in effect (meaning this test can be used) for the duration of the COVID-19 declaration under Section 564(b)(1) of the Act, 21 U.S.C.section 360bbb-3(b)(1), unless the authorization is terminated  or revoked sooner.       Influenza A by PCR NEGATIVE NEGATIVE Final   Influenza B by PCR NEGATIVE NEGATIVE Final    Comment: (NOTE) The Xpert Xpress SARS-CoV-2/FLU/RSV plus assay is intended as an aid in the diagnosis of influenza from Nasopharyngeal swab specimens and should not be used as a sole basis for treatment. Nasal washings and aspirates are unacceptable for Xpert Xpress SARS-CoV-2/FLU/RSV testing.  Fact Sheet for Patients: BloggerCourse.com  Fact Sheet for Healthcare Providers: SeriousBroker.it  This test is not yet approved or cleared by the [place] FDA and has been authorized for detection and/or diagnosis of SARS-CoV-2 by FDA under an Emergency Use Authorization (EUA). This EUA will remain in effect (meaning this test can be used) for the duration of the COVID-19 declaration under Section 564(b)(1) of the Act, 21 U.S.C. section 360bbb-3(b)(1), unless the authorization is terminated or revoked.     Resp Syncytial Virus by PCR NEGATIVE NEGATIVE Final    Comment: (NOTE) Fact Sheet for Patients: BloggerCourse.com  Fact Sheet for Healthcare Providers: SeriousBroker.it  This test is not yet approved or cleared by the [place] FDA and has been authorized for detection and/or diagnosis of SARS-CoV-2 by FDA under an Emergency Use Authorization (EUA). This EUA will remain in effect (meaning this test can be used) for  the duration of the COVID-19 declaration under Section 564(b)(1) of the Act, 21 U.S.C. section 360bbb-3(b)(1), unless the authorization is terminated or revoked.  Performed at [facility], [address], [place], [place] [address]          Radiology Studies: CT CHEST ABDOMEN PELVIS W CONTRAST  Result Date: 07/14/2023 CLINICAL DATA:  Sepsis. Altered mental status. Possible fall. Hypotension. EXAM: CT CHEST, ABDOMEN, AND PELVIS WITH CONTRAST TECHNIQUE:  Multidetector CT imaging of the chest, abdomen and pelvis was performed following the standard protocol during bolus administration of intravenous contrast. RADIATION DOSE REDUCTION: This exam was performed according to the departmental dose-optimization program which includes automated exposure control, adjustment of the mA and/or kV according to patient size and/or use of iterative reconstruction technique. CONTRAST:  OMNIPAQUE IOHEXOL 300 MG/ML  SOLN COMPARISON:  Chest radiograph 07/14/2023. CT abdomen and pelvis 12/03/2018 FINDINGS: CT CHEST FINDINGS Cardiovascular: Normal heart size. No pericardial effusions. Ascending thoracic aortic aneurysm measuring 4.1 cm AP diameter. No dissection. Great vessel origins are patent. Calcification of the aorta and coronary arteries. Mediastinum/Nodes: No enlarged mediastinal, hilar, or axillary lymph nodes. Thyroid gland, trachea, and esophagus demonstrate no significant findings. Lungs/Pleura: Mild dependent atelectasis in the lung bases. Focal area of peribronchial infiltration in the right upper lung centrally. This may represent focal pneumonia or possibly limited aspiration. No pleural effusions. No pneumothorax. Musculoskeletal: Degenerative changes in the spine. No focal bone lesions. Postoperative changes in the cervical spine. CT ABDOMEN PELVIS FINDINGS Hepatobiliary: No focal liver lesions. No bile duct dilatation. Gallbladder is not identified and could be contracted or  surgically absent. Pancreas: Unremarkable. No pancreatic ductal dilatation or surrounding inflammatory changes. Spleen: Normal in size without focal abnormality. Adrenals/Urinary Tract: Adrenal glands are unremarkable. Kidneys are normal, without renal calculi, focal lesion, or hydronephrosis. Bladder is unremarkable. Stomach/Bowel: Stomach is mildly distended with ingested material. No discrete wall thickening or inflammatory change. Small bowel and colon are not abnormally distended. Stool fills the colon. Appendix is not identified. Vascular/Lymphatic: Aortic atherosclerosis. No enlarged abdominal or pelvic lymph nodes. Reproductive: Uterus and bilateral adnexa are unremarkable. Other: Small amount of free fluid in the abdomen and pelvis, likely mild ascites. No free air. Abdominal wall musculature appears intact. Musculoskeletal: Degenerative changes in the spine. No focal bone lesions. Comment: Examination is technically limited due to nonstandard patient positioning. IMPRESSION: 1. Ascending thoracic aortic aneurysm measuring 4.1 cm AP diameter. Recommend annual imaging followup by CTA or MRA. This recommendation follows 2010 ACCF/AHA/AATS/ACR/ASA/SCA/SCAI/SIR/STS/SVM Guidelines for the Diagnosis and Management of Patients with Thoracic Aortic Disease. Circulation. 2010; 121: Z610-R604. Aortic aneurysm NOS (ICD10-I71.9) 2. Focal area of peribronchial infiltration in the right mid lung centrally may represent focal bronchopneumonia or aspiration. 3. No acute process demonstrated in the abdomen or pelvis. Stomach appears distended with ingested material but no obstructing lesion is identified. 4. Small amount of free fluid in the abdomen and pelvis likely representing mild ascites. 5. Aortic atherosclerosis. Electronically Signed   By: Burman Nieves M.D.   On: 07/14/2023 20:42   CT Head Wo Contrast  Result Date: 07/14/2023 CLINICAL DATA:  Possible fall, hypotension, febrile, altered level of  consciousness EXAM: CT HEAD WITHOUT CONTRAST TECHNIQUE: Contiguous axial images were obtained from the base of the skull through the vertex without intravenous contrast. RADIATION DOSE REDUCTION: This exam was performed according to the departmental dose-optimization program which includes automated exposure control, adjustment of the mA and/or kV according to patient size and/or use of iterative reconstruction technique. COMPARISON:  06/15/2023 FINDINGS: Brain: No evidence of acute infarct or hemorrhage. Lateral ventricles and midline structures are unremarkable. No acute extra-axial fluid collections. No mass effect. Vascular: No hyperdense vessel or unexpected calcification. Skull: Increased posterior scalp hematoma. No underlying fractures. The remainder of the calvarium is unremarkable. Sinuses/Orbits: No acute finding. Other: None. IMPRESSION: 1. Enlarged posterior scalp hematoma.  No underlying fracture. 2. No acute intracranial process. Electronically Signed   By: Maxwell Caul.D.  On: 07/14/2023 18:13   DG Chest Portable 1 View  Result Date: 07/14/2023 CLINICAL DATA:  Altered level of consciousness EXAM: PORTABLE CHEST 1 VIEW COMPARISON:  10/09/2019 FINDINGS: Single frontal view of the chest was obtained with the patient rotated toward the right. The cardiac silhouette is unremarkable. No acute airspace disease, effusion, or pneumothorax. Subacute right posterolateral fifth rib fracture with callus formation. No other acute bony abnormalities. IMPRESSION: 1. Healing subacute right posterolateral fifth rib fracture. 2. Otherwise no acute intrathoracic process. Electronically Signed   By: Sharlet Salina M.D.   On: 07/14/2023 18:11        Scheduled Meds:  azithromycin  500 mg Oral QHS   enoxaparin (LOVENOX) injection  40 mg Subcutaneous Q24H   gabapentin  400 mg Oral TID   propranolol  10 mg Oral BID   traZODone  100 mg Oral QHS   traZODone  50 mg Oral Daily   And   traZODone  25 mg Oral  Q1500   Continuous Infusions:  cefTRIAXone (ROCEPHIN)  IV Stopped (07/15/23 1559)     LOS: 2 days    Time spent: 35 minutes    [name], MD [facility]

## 2023-07-16 NOTE — Plan of Care (Signed)
  Problem: Safety: Goal: Non-violent Restraint(s) Outcome: Progressing   Problem: Education: Goal: Knowledge of General Education information will improve Description: Including pain rating scale, medication(s)/side effects and non-pharmacologic comfort measures Outcome: Progressing   Problem: Health Behavior/Discharge Planning: Goal: Ability to manage health-related needs will improve Outcome: Progressing   

## 2023-07-16 NOTE — Consult Note (Signed)
Consultation Note Date: 07/16/2023   Patient Name: Tina Fitzpatrick  DOB: July 06, 1951  MRN: [id number]  Age / Sex: 72 y.o., female  PCP: Patient, No Pcp Per Referring Physician: [name], MD  Reason for Consultation: Establishing goals of care   HPI/Patient Profile: 72 y.o. female    admitted on 07/14/2023   72 year old lady from [place], followed by hospice for advanced dementia, underlying hypertension dyslipidemia depression and GERD Admitted from nursing home with altered mental status recurrent falls imaging suggestive of possible midlung aspiration pneumonia. Admitted to hospital medicine service Being followed by hospice liaisons    Clinical Assessment and Goals of Care: Palliative consult for symptom management of agitation and ongoing goals of care discussions has been requested.  Chart reviewed, patient seen and examined.  Discussed with SLP colleague present in the room Palliative medicine is specialized medical care for people living with serious illness. It focuses on providing relief from the symptoms and stress of a serious illness. The goal is to improve quality of life for both the patient and the family. Goals of care: Broad aims of medical therapy in relation to the patient's values and preferences. Our aim is to provide medical care aimed at enabling patients to achieve the goals that matter most to them, given the circumstances of their particular medical situation and their constraints.   Medication history reviewed.  Recommend addition of low-dose orally disintegrating mirtazapine-Remeron for mood, sleep, appetite.  Otherwise, agree with continuation of hospice services.  Agree with continuation of current mode of care. Thank you for the consult.   NEXT OF KIN Husband Christia Domke  SUMMARY OF RECOMMENDATIONS   Agree with DNR Agree with continuation of hospice  support Add low-dose Remeron orally disintegrating for mood, sleep and appetite.  Continue to monitor for effect. Thank you for the consult.   Code Status/Advance Care Planning: DNR   Symptom Management:      Palliative Prophylaxis:  Delirium Protocol   Psycho-social/Spiritual:  Desire for further Chaplaincy support:yes Additional Recommendations: Education on Hospice  Prognosis:  < 6 months  Discharge Planning:  facility with hospice.        Primary Diagnoses: Present on Admission:  Community acquired pneumonia   I have reviewed the medical record, interviewed the patient and family, and examined the patient. The following aspects are pertinent.  Past Medical History:  Diagnosis Date   DDD (degenerative disc disease), cervical    Dementia (HCC)    Depression    GERD (gastroesophageal reflux disease)    Hyperlipidemia    Hypertension    Kidney stones    Social History   Socioeconomic History   Marital status: Married    Spouse name: Not on file   Number of children: 2   Years of education: Not on file   Highest education level: Not on file  Occupational History   Not on file  Tobacco Use   Smoking status: Former    Types: Cigarettes   Smokeless tobacco: Never  Vaping Use  Vaping status: Never Used  Substance and Sexual Activity   Alcohol use: No    Alcohol/week: 0.0 standard drinks of alcohol   Drug use: No   Sexual activity: Yes  Other Topics Concern   Not on file  Social History Narrative   Married with 2 children   Right handed    12 th grade    1 cup daily   Social Determinants of Health   Financial Resource Strain: Not on file  Food Insecurity: Patient Unable To Answer (07/15/2023)   Hunger Vital Sign    Worried About Running Out of Food in the Last Year: Patient unable to answer    Ran Out of Food in the Last Year: Patient unable to answer  Transportation Needs: Patient Unable To Answer (07/15/2023)   PRAPARE - Transportation     Lack of Transportation (Medical): Patient unable to answer    Lack of Transportation (Non-Medical): Patient unable to answer  Physical Activity: Not on file  Stress: Not on file  Social Connections: Unknown (01/26/2022)   Received from [employer], [facility]   Social Network    Social Network: Not on file   Family History  Problem Relation Age of Onset   Heart attack Mother    Colon cancer Neg Hx    Esophageal cancer Neg Hx    Scheduled Meds:  azithromycin  500 mg Oral QHS   enoxaparin (LOVENOX) injection  40 mg Subcutaneous Q24H   gabapentin  400 mg Oral TID   mirtazapine  7.5 mg Oral QHS   propranolol  10 mg Oral BID   traZODone  100 mg Oral QHS   traZODone  50 mg Oral Daily   And   traZODone  25 mg Oral Q1500   Continuous Infusions:  cefTRIAXone (ROCEPHIN)  IV Stopped (07/15/23 1559)   PRN Meds:.acetaminophen, acetaminophen, clonazePAM, haloperidol lactate Medications Prior to Admission:  Prior to Admission medications   Medication Sig Start Date End Date Taking? Authorizing Provider  acetaminophen (TYLENOL) 500 MG tablet Take 500 mg by mouth in the morning, at noon, and at bedtime.   Yes [provider]  clonazePAM (KLONOPIN) 0.5 MG tablet Take 0.5 mg by mouth daily as needed (agitation).   Yes [provider]  gabapentin (NEURONTIN) 400 MG capsule Take 400 mg by mouth 3 (three) times daily.   Yes [provider]  ipratropium-albuterol (DUONEB) 0.5-2.5 (3) MG/3ML SOLN Take 3 mLs by nebulization every 6 (six) hours as needed (wheezing/SOB).   Yes [provider]  neomycin-bacitracin-polymyxin [phone] OINT Apply 1 Application topically See admin instructions. Apply a pea-sized amount of ointment to wound on scalp once a day after cleansing and drying- until healed   Yes [provider]  NON FORMULARY Apply 1 application  topically See admin instructions. Zinc paste 17%- Apply to buttocks/perineal area with incontinent  care- every shift (per [facility])   Yes [provider]  Nutritional Supplements (NUTRITIONAL SHAKE) LIQD Take 2 Bottles by mouth See admin instructions. Drink 2 cartons/bottles by mouth three times a day   Yes [provider]  Povidone-Iodine Paint Sponge 10 % SWAB Apply 1 application  topically See admin instructions. Apply to scalp laceration once a day. Cleanse gently with swab, allow to dry, then apply a pea-sized amount of triple antibiotic ointment to the wound. Apply until healed.   Yes [provider]  propranolol (INDERAL) 10 MG tablet Take 10 mg by mouth See admin instructions. Take 10 mg by mouth two  times a day and hold for pulse under 55   Yes [provider]  traZODone (DESYREL) 100 MG tablet Take 100 mg by mouth at bedtime.   Yes [provider]  traZODone (DESYREL) 50 MG tablet Take 25-50 mg by mouth See admin instructions. Take 50 mg by mouth every morning and 25 mg by mouth every afternoon.   Yes [provider]  cephALEXin (KEFLEX) 500 MG capsule Take 1 capsule (500 mg total) by mouth 3 (three) times daily. Patient not taking: Reported on 07/14/2023 06/20/23   Dione Booze, MD   No Known Allergies Review of Systems +weakness.   Physical Exam Weak appearing lady sitting up in bed Able to feed herself breakfast Regular work of breathing Slow to respond but does answer a few questions appropriately Not agitated currently Regular work of breathing  Vital Signs: BP (!) 84/73 (BP Location: Left Arm)   Pulse 73   Temp 97.6 F (36.4 C)   Resp 18   Ht 5\' 6"  (1.676 m)   Wt 59 kg   SpO2 100%   BMI 20.99 kg/m  Pain Scale: 0-10   Pain Score: 0-No pain   SpO2: SpO2: 100 % O2 Device:SpO2: 100 % O2 Flow Rate: .   IO: Intake/output summary:  Intake/Output Summary (Last 24 hours) at 07/16/2023 1439 Last data filed at 07/16/2023 0600 Gross per 24 hour  Intake 590 ml  Output --  Net 590 ml    LBM: Last BM Date :   (PTA) Baseline Weight: Weight: 59 kg Most recent weight: Weight: 59 kg     Palliative Assessment/Data:   PPS 40%  Time In:  1330 Time Out:  1430 Time Total:  60 min.  Greater than 50%  of this time was spent counseling and coordinating care related to the above assessment and plan.  Signed by: Rosalin Hawking, MD   Please contact Palliative Medicine Team phone at [phone] for questions and concerns.  For individual provider: See Loretha Stapler

## 2023-07-16 NOTE — Progress Notes (Signed)
Tina Fitzpatrick [address] [facility] Patient Visit   Tina Fitzpatrick is a current [facility] hospice patient with a terminal diagnosis of severe protein calorie malnutrition. She had a fall in her facility and was sent to ED for evaluation after altered mental status. She was admitted to the hospital 10.30.24 with a diagnosis of acute metabolic encephalopathy and pneumonia. Per Dr. Patric Dykes with [facility] this is a related hospital admission.   Visited at bedside, patient is resting with eyes closed and does not stir. No wrist restraints at this time. Spoke with patient's spouse with care update. Per hospital CM patient will be able to return to facility once she has been free of restraints for 24 hours.    Patient is inpatient appropriate due to need for IVAB and treatment of acute encephalopathy.  Vital Signs: 97.8/76/17    136/61    spO2 100% room air I&O: no new documentation Abnormal labs: no new labs   Diagnostics: no new diagnostics   IV/PRN Meds: Rocephin 2g q24H  Problem List as per attending note 11.1 Right middle lobe pneumonia: Likely aspiration pneumonia.  Continue Rocephin and azithromycin today.  Even though she does not have any obvious aspiration, likely has chronic recurrent aspiration.  Currently on room air.  Continue supportive care.  Speech therapy to evaluate for level of aspiration.  Patient is unable to participate in any respiratory physio including incentive spirometry.   Altered mental status, likely dementia with behavioral disturbances: Patient on trazodone, Klonopin that will be continued.  Haloperidol as needed.   Today her symptoms are better controlled.   Will consult palliative care and hospice team as well to help control her symptoms before going back to the nursing home. Soft restraints as needed to protect lines and to protect from injuries.  Will try medications and try to take of the restraints.   Hypertension:  Reported in the emergency room.  Currently normotensive.  Not on any high antihypertensives.   Goal of care: See discussion with patient's husband 10/31. Patient with quite advanced dementia and behavior issues. Plan is to control her symptoms and get her back to nursing home with hospice in place.    Discharge Planning: Ongoing, d/c once IVAB complete, likely back to facility Family Contact: Spoke with spouse by phone. IDT: Updated Goals of Care: DNR  Henderson Newcomer, LPN [facility] Liaison [phone]

## 2023-07-16 NOTE — Progress Notes (Signed)
Clinical/Bedside Swallow Evaluation Patient Details  Name: NORLEEN XIE MRN: [id number] Date of Birth: 09/26/1950  Today's Date: 07/16/2023 Time: SLP Start Time (ACUTE ONLY): 2536 SLP Stop Time (ACUTE ONLY): 1010 SLP Time Calculation (min) (ACUTE ONLY): 33 min  Past Medical History:  Past Medical History:  Diagnosis Date   DDD (degenerative disc disease), cervical    Dementia (HCC)    Depression    GERD (gastroesophageal reflux disease)    Hyperlipidemia    Hypertension    Kidney stones    Past Surgical History:  Past Surgical History:  Procedure Laterality Date   kidney stones  1988   NECK SURGERY  2008   Plate/Screws   HPI:  Patient is 72 year old female admitted to [facility] long hospital with altered mental status, agitation found to have right middle lobe pneumonia and a UTI.  Past medical history also significant for GERD and diffuse degenerative disc and facet disease of cervical spine status post ACDF C5-C6-C7, and prior falls 06/15/2023.  Swallow valuation ordered    Assessment / Plan / Recommendation  Clinical Impression  Patient did not follow directions during evaluation but SLP was able to observe her consume the last one third of her entire meal.  Adequate mastication skills noted with no indication of aspiration across all solids, even with very took English muffin.  No oral pocketing obvious but patient did not allow SLP to observe oral cavity.  Patient does present with occasional cough after swallow with thin liquid via straw more so than via cup.  This was also not prevented however with consumption of Ensure which is slightly thicker.  Patient eats at a very rapid rate and does not respond to cues to slow. She naturally takes small boluses sizes from her  Recommend continue diet, avoid straws, give medications crushed with pure as RN reports patient masticate's pills.  Also question component of GERD given patient has history of this has student nurse reports  patient reportedly had a dry cough all night long.    Despite maximum encouragement and cues patient did not brush her teeth, and suspect this may also contribute to potential aspiration pneumonia.  Of note patient consumed banana peel and squeezed SLPs hand very tightly swearing at SLP.  Was finally able to remove the peel from her hand and hide it.  She also was eating toothpaste off the table.    Recommend assure patient only has items in front of her that she can safely consume.  SLP will sign off at this time. SLP Visit Diagnosis: Dysphagia, pharyngeal phase (R13.13);Dysphagia, unspecified (R13.10)    Aspiration Risk  Mild aspiration risk    Diet Recommendation Regular;Thin liquid    Liquid Administration via: Cup;No straw Medication Administration: Crushed with puree Supervision: Patient able to self feed;Intermittent supervision to cue for compensatory strategies Compensations: Slow rate;Small sips/bites Postural Changes: Seated upright at 90 degrees;Remain upright for at least 30 minutes after po intake    Other  Recommendations Oral Care Recommendations: Other (Comment) (Encourage patient to brush her teeth is much as possible)    Recommendations for follow up therapy are one component of a multi-disciplinary discharge planning process, led by the attending physician.  Recommendations may be updated based on patient status, additional functional criteria and insurance authorization.  Follow up Recommendations No SLP follow up      Assistance Recommended at Discharge    Functional Status Assessment Patient has had a recent decline in their functional status and demonstrates the ability to  make significant improvements in function in a reasonable and predictable amount of time.  Frequency and Duration            Prognosis        Swallow Study   General Date of Onset: 07/16/23 HPI: Patient is 72 year old female admitted to [facility] long hospital with altered mental status,  agitation found to have right middle lobe pneumonia and a UTI.  Past medical history also significant for GERD and diffuse degenerative disc and facet disease of cervical spine status post ACDF C5-C6-C7, and prior falls 06/15/2023.  Swallow valuation ordered Type of Study: Bedside Swallow Evaluation Previous Swallow Assessment: None in chart Diet Prior to this Study: Regular;Thin liquids (Level 0) Temperature Spikes Noted: No Respiratory Status: Room air History of Recent Intubation: No Behavior/Cognition: Alert;Doesn't follow directions Oral Cavity Assessment: Within Functional Limits Oral Care Completed by SLP: No (Patient will not brush her teeth despite frequent cues and requests) Oral Cavity - Dentition: Adequate natural dentition Self-Feeding Abilities: Able to feed self Patient Positioning: Upright in bed Baseline Vocal Quality: Normal Volitional Cough: Cognitively unable to elicit Volitional Swallow: Unable to elicit    Oral/Motor/Sensory Function Overall Oral Motor/Sensory Function: Within functional limits (From task patient will complete)   Ice Chips Ice chips: Not tested   Thin Liquid Thin Liquid: Impaired Presentation: Cup;Self Fed;Straw Pharyngeal  Phase Impairments: Cough - Immediate    Nectar Thick Nectar Thick Liquid: Within functional limits Presentation: Cup;Self Fed   Honey Thick Honey Thick Liquid: Not tested   Puree Puree: Within functional limits Presentation: Self Fed;Spoon   Solid     Solid: Within functional limits Presentation: Self Fed;Spoon      Chales Abrahams 07/16/2023,11:12 AM

## 2023-07-17 DIAGNOSIS — J189 Pneumonia, unspecified organism: Secondary | ICD-10-CM | POA: Diagnosis not present

## 2023-07-17 DIAGNOSIS — Z515 Encounter for palliative care: Secondary | ICD-10-CM | POA: Diagnosis not present

## 2023-07-17 DIAGNOSIS — Z7189 Other specified counseling: Secondary | ICD-10-CM | POA: Diagnosis not present

## 2023-07-17 DIAGNOSIS — R531 Weakness: Secondary | ICD-10-CM | POA: Diagnosis not present

## 2023-07-17 NOTE — Progress Notes (Signed)
PROGRESS NOTE    GIGI ONSTAD  ZDG:[id number] DOB: [birth year] DOA: 07/14/2023 PCP: Patient, No Pcp Per    Brief Narrative:  72 year old with history of advanced dementia, hypertension, hyperlipidemia and depression and GERD brought from nursing home with altered mental status.  Probably reported falls at nursing home.  Reportedly with EMS blood pressure was 80 and improved with fluids.  Combative in the ER.  Normotensive.  Low-grade fever with temperature 100.2.  Infection workup was negative.  CT scan of the head with no complications.  CT chest abdomen pelvis with no acute findings except focal area of peribronchial inflation of the right mid lung suspicious of aspiration.  Started on Rocephin and azithromycin and admitted  to the hospital.  07/17/2023: Patient seen.  Patient is resting quietly.  Subjective: -Resting quietly. -No complaints.  Assessment & Plan:   Right middle lobe pneumonia: Likely aspiration pneumonia.  Continue Rocephin and azithromycin today.  Even though she does not have any obvious aspiration, likely has chronic recurrent aspiration.  Currently on room air.  Continue supportive care.  Speech therapy to evaluate for level of aspiration.  Patient is unable to participate in any respiratory physio including incentive spirometry. 07/17/2023: Complete course of antibiotics.  Altered mental status, likely dementia with behavioral disturbances: Patient on trazodone, Klonopin that will be continued.  Haloperidol as needed.   Today her symptoms are better controlled.   Will consult palliative care and hospice team as well to help control her symptoms before going back to the nursing home. Soft restraints as needed to protect lines and to protect from injuries.  Will try medications and try to take of the restraints.  Hypertension: Reported in the emergency room.  Currently normotensive.  Not on any high antihypertensives.    Goal of care: See discussion with patient's  husband 10/31.  Patient with quite advanced dementia and behavior issues.  Plan is to control her symptoms and get her back to nursing home with hospice in place.    DVT prophylaxis: enoxaparin (LOVENOX) injection 40 mg Start: 07/14/23 2200   Code Status: DNR Family Communication: none at the bedside.  Husband on the phone 10/31. Disposition Plan: Status is: Inpatient Remains inpatient appropriate because: altered mental status , on restraints      Consultants:  Palliative care  Procedures:  None   Antimicrobials:  Rocephin and azithromycin 10/30---     Objective: Vitals:   07/16/23 1015 07/16/23 1346 07/16/23 2126 07/17/23 0515  BP:  (!) 84/73 (!) 100/45 94/65  Pulse: 76 73 63 77  Resp:  18 16 18   Temp:  97.6 F (36.4 C) 99.1 F (37.3 C) 98.3 F (36.8 C)  TempSrc:   Axillary Axillary  SpO2:  100% 100% 100%  Weight:      Height:        Intake/Output Summary (Last 24 hours) at 07/17/2023 1155 Last data filed at 07/17/2023 0600 Gross per 24 hour  Intake 220 ml  Output 400 ml  Net -180 ml   Filed Weights   07/14/23 1503  Weight: 59 kg    Examination:  General exam: Patient is resting quietly.   Respiratory system: Clear to auscultation.  Cardiovascular system: S1 & S2 heard, RRR.  No pedal edema. Gastrointestinal system: Soft.  Nontender.  Bowel sound present. Central nervous system: Resting quietly.. Extremities: No leg edema.   Data Reviewed: I have personally reviewed following labs and imaging studies  CBC: Recent Labs  Lab 07/14/23 1535 07/15/23 0350  WBC 10.5 6.2  NEUTROABS 8.7*  --   HGB 11.3* 10.8*  HCT 33.5* 34.1*  MCV 93.6 99.7  PLT 253 153   Basic Metabolic Panel: Recent Labs  Lab 07/14/23 1535 07/15/23 0350  NA 136 136  K 4.3 4.1  CL 103 107  CO2 24 23  GLUCOSE 118* 91  BUN 18 13  CREATININE 0.50 0.40*  CALCIUM 9.1 8.6*   GFR: Estimated Creatinine Clearance: 60.1 mL/min (A) (by C-G formula based on SCr of 0.4 mg/dL  (L)). Liver Function Tests: Recent Labs  Lab 07/14/23 1535  AST 20  ALT 18  ALKPHOS 59  BILITOT 0.7  PROT 6.5  ALBUMIN 3.7   No results for input(s): "LIPASE", "AMYLASE" in the last 168 hours. No results for input(s): "AMMONIA" in the last 168 hours. Coagulation Profile: No results for input(s): "INR", "PROTIME" in the last 168 hours. Cardiac Enzymes: No results for input(s): "CKTOTAL", "CKMB", "CKMBINDEX", "TROPONINI" in the last 168 hours. BNP (last 3 results) No results for input(s): "PROBNP" in the last 8760 hours. HbA1C: No results for input(s): "HGBA1C" in the last 72 hours. CBG: Recent Labs  Lab 07/14/23 1542  GLUCAP 77   Lipid Profile: No results for input(s): "CHOL", "HDL", "LDLCALC", "TRIG", "CHOLHDL", "LDLDIRECT" in the last 72 hours. Thyroid Function Tests: No results for input(s): "TSH", "T4TOTAL", "FREET4", "T3FREE", "THYROIDAB" in the last 72 hours. Anemia Panel: No results for input(s): "VITAMINB12", "FOLATE", "FERRITIN", "TIBC", "IRON", "RETICCTPCT" in the last 72 hours. Sepsis Labs: Recent Labs  Lab 07/14/23 1548  LATICACIDVEN 1.9    Recent Results (from the past 240 hour(s))  Culture, blood (routine x 2)     Status: None (Preliminary result)   Collection Time: 07/14/23  3:35 PM   Specimen: BLOOD  Result Value Ref Range Status   Specimen Description   Final    BLOOD BLOOD RIGHT FOREARM Performed at [facility], [address] [place], [place] [address]    Special Requests   Final    BOTTLES DRAWN AEROBIC AND ANAEROBIC Blood Culture adequate volume Performed at [facility], [address] [place], [place] [address]    Culture   Final    NO GROWTH 3 DAYS Performed at [facility], [address] [place], [place] [address]    Report Status PENDING  Incomplete  Urine Culture     Status: None   Collection Time: 07/14/23  3:41 PM   Specimen: Urine, Catheterized  Result Value Ref Range Status    Specimen Description   Final    URINE, CATHETERIZED Performed at [facility], [address], [place], [place] [address]    Special Requests   Final    NONE Performed at [facility], [address], [place], [place] [address]    Culture   Final    NO GROWTH Performed at [facility], [address], [place], [place] [address]    Report Status 07/15/2023 FINAL  Final  Culture, blood (routine x 2)     Status: Abnormal   Collection Time: 07/14/23  3:44 PM   Specimen: BLOOD LEFT FOREARM  Result Value Ref Range Status   Specimen Description   Final    BLOOD LEFT FOREARM Performed at [facility], [address], [place], [place] [address]    Special Requests   Final    BOTTLES DRAWN AEROBIC AND ANAEROBIC Blood Culture results may not be optimal due to  an excessive volume of blood received in culture bottles Performed at [facility], [address] [place], [place] [address]    Culture  Setup Time   Final    GRAM POSITIVE COCCI IN CLUSTERS AEROBIC BOTTLE ONLY CRITICAL RESULT CALLED TO, READ BACK BY AND VERIFIED WITH: PHARMD AHN PHAM ON 07/15/23 @ 1943 BY DRT    Culture (A)  Final    STAPHYLOCOCCUS EPIDERMIDIS THE SIGNIFICANCE OF ISOLATING THIS ORGANISM FROM A SINGLE SET OF BLOOD CULTURES WHEN MULTIPLE SETS ARE DRAWN IS UNCERTAIN. PLEASE NOTIFY THE MICROBIOLOGY DEPARTMENT WITHIN ONE WEEK IF SPECIATION AND SENSITIVITIES ARE REQUIRED. Performed at [facility], [address], [place], [place] [address]    Report Status 07/16/2023 FINAL  Final  Blood Culture ID Panel (Reflexed)     Status: Abnormal   Collection Time: 07/14/23  3:44 PM  Result Value Ref Range Status   Enterococcus faecalis NOT DETECTED NOT DETECTED Final   Enterococcus Faecium NOT DETECTED NOT DETECTED Final   Listeria monocytogenes NOT DETECTED NOT DETECTED Final   Staphylococcus species DETECTED (A) NOT DETECTED Final    Comment:  CRITICAL RESULT CALLED TO, READ BACK BY AND VERIFIED WITH: PHARMD AHN PHAM ON 07/15/23 @ 1943 BY DRT    Staphylococcus aureus (BCID) NOT DETECTED NOT DETECTED Final   Staphylococcus epidermidis DETECTED (A) NOT DETECTED Final    Comment: Methicillin (oxacillin) resistant coagulase negative staphylococcus. Possible blood culture contaminant (unless isolated from more than one blood culture draw or clinical case suggests pathogenicity). No antibiotic treatment is indicated for blood  culture contaminants. CRITICAL RESULT CALLED TO, READ BACK BY AND VERIFIED WITH: PHARMD AHN PHAM ON 07/15/23 @ 1943 BY DRT    Staphylococcus lugdunensis NOT DETECTED NOT DETECTED Final   Streptococcus species NOT DETECTED NOT DETECTED Final   Streptococcus agalactiae NOT DETECTED NOT DETECTED Final   Streptococcus pneumoniae NOT DETECTED NOT DETECTED Final   Streptococcus pyogenes NOT DETECTED NOT DETECTED Final   A.calcoaceticus-baumannii NOT DETECTED NOT DETECTED Final   Bacteroides fragilis NOT DETECTED NOT DETECTED Final   Enterobacterales NOT DETECTED NOT DETECTED Final   Enterobacter cloacae complex NOT DETECTED NOT DETECTED Final   Escherichia coli NOT DETECTED NOT DETECTED Final   Klebsiella aerogenes NOT DETECTED NOT DETECTED Final   Klebsiella oxytoca NOT DETECTED NOT DETECTED Final   Klebsiella pneumoniae NOT DETECTED NOT DETECTED Final   Proteus species NOT DETECTED NOT DETECTED Final   Salmonella species NOT DETECTED NOT DETECTED Final   Serratia marcescens NOT DETECTED NOT DETECTED Final   Haemophilus influenzae NOT DETECTED NOT DETECTED Final   Neisseria meningitidis NOT DETECTED NOT DETECTED Final   Pseudomonas aeruginosa NOT DETECTED NOT DETECTED Final   Stenotrophomonas maltophilia NOT DETECTED NOT DETECTED Final   Candida albicans NOT DETECTED NOT DETECTED Final   Candida auris NOT DETECTED NOT DETECTED Final   Candida glabrata NOT DETECTED NOT DETECTED Final   Candida krusei NOT  DETECTED NOT DETECTED Final   Candida parapsilosis NOT DETECTED NOT DETECTED Final   Candida tropicalis NOT DETECTED NOT DETECTED Final   Cryptococcus neoformans/gattii NOT DETECTED NOT DETECTED Final   Methicillin resistance mecA/C DETECTED (A) NOT DETECTED Final    Comment: CRITICAL RESULT CALLED TO, READ BACK BY AND VERIFIED WITH: PHARMD AHN PHAM ON 07/15/23 @ 1943 BY DRT Performed at [facility], [address] [place], [place] [address]   Resp panel by RT-PCR (RSV, Flu A&B, Covid) Anterior Nasal Swab  Status: None   Collection Time: 07/14/23  3:54 PM   Specimen: Anterior Nasal Swab  Result Value Ref Range Status   SARS Coronavirus 2 by RT PCR NEGATIVE NEGATIVE Final    Comment: (NOTE) SARS-CoV-2 target nucleic acids are NOT DETECTED.  The SARS-CoV-2 RNA is generally detectable in upper respiratory specimens during the acute phase of infection. The lowest concentration of SARS-CoV-2 viral copies this assay can detect is 138 copies/mL. A negative result does not preclude SARS-Cov-2 infection and should not be used as the sole basis for treatment or other patient management decisions. A negative result may occur with  improper specimen collection/handling, submission of specimen other than nasopharyngeal swab, presence of viral mutation(s) within the areas targeted by this assay, and inadequate number of viral copies(<138 copies/mL). A negative result must be combined with clinical observations, patient history, and epidemiological information. The expected result is Negative.  Fact Sheet for Patients:  BloggerCourse.com  Fact Sheet for Healthcare Providers:  SeriousBroker.it  This test is no t yet approved or cleared by the [place] FDA and  has been authorized for detection and/or diagnosis of SARS-CoV-2 by FDA under an Emergency Use Authorization (EUA). This EUA will remain  in effect (meaning this test can  be used) for the duration of the COVID-19 declaration under Section 564(b)(1) of the Act, 21 U.S.C.section 360bbb-3(b)(1), unless the authorization is terminated  or revoked sooner.       Influenza A by PCR NEGATIVE NEGATIVE Final   Influenza B by PCR NEGATIVE NEGATIVE Final    Comment: (NOTE) The Xpert Xpress SARS-CoV-2/FLU/RSV plus assay is intended as an aid in the diagnosis of influenza from Nasopharyngeal swab specimens and should not be used as a sole basis for treatment. Nasal washings and aspirates are unacceptable for Xpert Xpress SARS-CoV-2/FLU/RSV testing.  Fact Sheet for Patients: BloggerCourse.com  Fact Sheet for Healthcare Providers: SeriousBroker.it  This test is not yet approved or cleared by the [place] FDA and has been authorized for detection and/or diagnosis of SARS-CoV-2 by FDA under an Emergency Use Authorization (EUA). This EUA will remain in effect (meaning this test can be used) for the duration of the COVID-19 declaration under Section 564(b)(1) of the Act, 21 U.S.C. section 360bbb-3(b)(1), unless the authorization is terminated or revoked.     Resp Syncytial Virus by PCR NEGATIVE NEGATIVE Final    Comment: (NOTE) Fact Sheet for Patients: BloggerCourse.com  Fact Sheet for Healthcare Providers: SeriousBroker.it  This test is not yet approved or cleared by the [place] FDA and has been authorized for detection and/or diagnosis of SARS-CoV-2 by FDA under an Emergency Use Authorization (EUA). This EUA will remain in effect (meaning this test can be used) for the duration of the COVID-19 declaration under Section 564(b)(1) of the Act, 21 U.S.C. section 360bbb-3(b)(1), unless the authorization is terminated or revoked.  Performed at [facility], [address] [place], [place] [address]          Radiology  Studies: No results found.      Scheduled Meds:  azithromycin  500 mg Oral QHS   enoxaparin (LOVENOX) injection  40 mg Subcutaneous Q24H   gabapentin  400 mg Oral TID   mirtazapine  7.5 mg Oral QHS   propranolol  10 mg Oral BID   traZODone  100 mg Oral QHS   traZODone  50 mg Oral Daily   And   traZODone  25 mg Oral Q1500   Continuous Infusions:  cefTRIAXone (ROCEPHIN)  IV 2 g (07/16/23 1457)     LOS: 3 days    Time spent: 35 minutes    Barnetta Chapel, MD [facility]

## 2023-07-17 NOTE — Progress Notes (Signed)
Daily Progress Note   Patient Name: Tina Fitzpatrick       Date: 07/17/2023 DOB: 07/09/1951  Age: 72 y.o. MRN#: [id number] Attending Physician: Barnetta Chapel, MD Primary Care Physician: Patient, No Pcp Per Admit Date: 07/14/2023  Reason for Consultation/Follow-up: Non pain symptom management  Subjective: Resting in bed, does not appear to be in distress.  Chart reviewed.  Medication history noted.  No family at bedside.  Length of Stay: 3  Current Medications: Scheduled Meds:   azithromycin  500 mg Oral QHS   enoxaparin (LOVENOX) injection  40 mg Subcutaneous Q24H   gabapentin  400 mg Oral TID   mirtazapine  7.5 mg Oral QHS   propranolol  10 mg Oral BID   traZODone  100 mg Oral QHS   traZODone  50 mg Oral Daily   And   traZODone  25 mg Oral Q1500    Continuous Infusions:  cefTRIAXone (ROCEPHIN)  IV 2 g (07/17/23 1452)    PRN Meds: acetaminophen, acetaminophen, clonazePAM, haloperidol lactate  Physical Exam         Noted to be resting comfortably No acute distress Appears to have regular work of breathing  Vital Signs: BP (!) 100/57 (BP Location: Left Arm)   Pulse 100   Temp 98.1 F (36.7 C)   Resp 16   Ht 5\' 6"  (1.676 m)   Wt 59 kg   SpO2 93%   BMI 20.99 kg/m  SpO2: SpO2: 93 % O2 Device: O2 Device: Room Air O2 Flow Rate:    Intake/output summary:  Intake/Output Summary (Last 24 hours) at 07/17/2023 1639 Last data filed at 07/17/2023 1600 Gross per 24 hour  Intake 460 ml  Output 400 ml  Net 60 ml   LBM: Last BM Date : 07/16/23 (small) Baseline Weight: Weight: 59 kg Most recent weight: Weight: 59 kg       Palliative Assessment/Data:      Patient Active Problem List   Diagnosis Date Noted   Dementia with behavioral disturbance (HCC)  07/14/2023   CAP (community acquired pneumonia) 07/14/2023   Community acquired pneumonia 07/14/2023   Thoracic aortic aneurysm without rupture (HCC) 07/14/2023   Normochromic normocytic anemia 09/09/2017   Acute encephalopathy 09/08/2017   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 Recommend continuation of hospice services in the outpatient setting.   Code Status:    Code Status Orders  (From admission, onward)           Start     Ordered   07/14/23 2133  Do not attempt resuscitation (DNR)- Limited -Do Not Intubate (DNI)  (Code Status)  Continuous       Question Answer Comment  If pulseless and not breathing No CPR or chest compressions.   In Pre-Arrest Conditions (Patient Is Breathing and Has A Pulse) Do not intubate. Provide all appropriate non-invasive medical interventions. Avoid ICU transfer unless indicated or required.   Consent: Discussion documented in EHR or advanced directives reviewed      07/14/23 2132           Code Status History     Date Active Date Inactive Code Status Order ID Comments User Context   07/14/2023 2131 07/14/2023 2132 Full Code [id number]  Anselm Jungling, DO ED   09/09/2017 0320 09/11/2017 1756 DNR [id number]  Eduard Clos, MD Inpatient   10/30/2015 1957 11/02/2015 1742 Full Code [id number]  Marinda Elk, MD ED       Prognosis:  < 6 months  Discharge  Planning: Facility with hospice  Care plan was discussed with IDT  Thank you for allowing the Palliative Medicine Team to assist in the care of this patient.  Low MDM     Greater than 50%  of this time was spent counseling and coordinating care related to the above assessment and plan.  Rosalin Hawking, MD  Please contact Palliative Medicine Team phone at [phone] for questions and concerns.

## 2023-07-17 NOTE — Progress Notes (Signed)
[name] Patient Visit   Tina Fitzpatrick is a current [facility] hospice patient with a terminal diagnosis of severe protein calorie malnutrition. She had a fall in her facility and was sent to ED for evaluation after altered mental status. She was admitted to the hospital 10.30.24 with a diagnosis of acute metabolic encephalopathy and pneumonia. Per Dr. Patric Dykes with [facility] this is a related hospital admission.  Patient resting quietly with eyes closed. She does not stir when entering the room. Per report she has remained much calmer without need for restraints or sitter. Plan will be for her to return to her LTC facility once medically stable to leave the hospital.   Patient is inpatient appropriate due to need for IVAB and treatment of acute encephalopathy. Vital Signs: 98.1/100/16     100/57    93% room air I&O: 0/400 Abnormal labs: None new Diagnostics: None New IV/PRN Meds: Rocephin 2g q24H Problem List as per MD note, [name], MD 11.1.24: Right middle lobe pneumonia: Likely aspiration pneumonia.  Continue Rocephin and azithromycin today.  Even though she does not have any obvious aspiration, likely has chronic recurrent aspiration.  Currently on room air.  Continue supportive care.  Speech therapy to evaluate for level of aspiration.  Patient is unable to participate in any respiratory physio including incentive spirometry.   Altered mental status, likely dementia with behavioral disturbances: Patient on trazodone, Klonopin that will be continued.  Haloperidol as needed.   Today her symptoms are better controlled.   Will consult palliative care and hospice team as well to help control her symptoms before going back to the nursing home. Soft restraints as needed to protect lines and to protect from injuries.  Will try medications and try to take of the restraints.   Hypertension: Reported in the emergency room.   Currently normotensive.  Not on any high antihypertensives.     Discharge Planning: Ongoing, d/c once IVAB complete, likely back to facility Family Contact: Talked with husband IDT: Updated Goals of Care: DNR Thea Gist, BSN RN Hospice hospital liaison [phone]

## 2023-07-18 DIAGNOSIS — Z515 Encounter for palliative care: Secondary | ICD-10-CM | POA: Diagnosis not present

## 2023-07-18 DIAGNOSIS — J189 Pneumonia, unspecified organism: Secondary | ICD-10-CM | POA: Diagnosis not present

## 2023-07-18 LAB — GLUCOSE, CAPILLARY: Glucose-Capillary: 119 mg/dL — ABNORMAL HIGH (ref 70–99)

## 2023-07-18 MED ORDER — KCL IN DEXTROSE-NACL 10-5-0.45 MEQ/L-%-% IV SOLN
INTRAVENOUS | Status: DC
  Filled 2023-07-18: qty 1000

## 2023-07-18 MED ORDER — GABAPENTIN 300 MG PO CAPS
300.0000 mg | ORAL_CAPSULE | Freq: Three times a day (TID) | ORAL | Status: DC
  Administered 2023-07-18 – 2023-07-20 (×5): 300 mg via ORAL
  Filled 2023-07-18 (×5): qty 1

## 2023-07-18 NOTE — Plan of Care (Signed)
  Problem: Nutrition: Goal: Adequate nutrition will be maintained Outcome: Not Progressing   Problem: Coping: Goal: Level of anxiety will decrease Outcome: Not Progressing   Problem: Pain Management: Goal: General experience of comfort will improve Outcome: Not Progressing   Problem: Skin Integrity: Goal: Risk for impaired skin integrity will decrease Outcome: Not Progressing

## 2023-07-18 NOTE — Progress Notes (Signed)
[name] Patient Visit   Syd Manges is a current [facility] hospice patient with a terminal diagnosis of severe protein calorie malnutrition. She had a fall in her facility and was sent to ED for evaluation after altered mental status. She was admitted to the hospital 10.30.24 with a diagnosis of acute metabolic encephalopathy and pneumonia. Per Dr. Patric Dykes with [facility] this is a related hospital admission.   Visited patient at bedside.  Patient is extremely agitated this afternoon and is scratching, biting and hitting at caregivers in room.  Per bedside RN, it has been difficult getting patient to take po medication. Liaison spoke with PMT MD and primary MD regarding request to add additional medication to assist in managing behaviors. PMT MD also recommends psych consult for medication assistance.  Relayed these requests to primary MD.  Spoke with patient's husband, [name], via phone to update him on condition.  He voiced understanding and appreciation for keeping him updated.   Patient is inpatient appropriate due to need for IVAB and treatment of acute encephalopathy. Vital Signs: 98.5/67/18     86/45    99% room air I&O: 580/0 Abnormal labs: None new Diagnostics: None New IV/PRN Meds: Haldol 2mg  x 1, Klonopin 0.5mg  x 1, Rocephin 2g q 24 Problem List as per MD note, Berton Mount, MD 11.2.24: Assessment & Plan:   Right middle lobe pneumonia: Likely aspiration pneumonia.  Continue Rocephin and azithromycin today.  Even though she does not have any obvious aspiration, likely has chronic recurrent aspiration.  Currently on room air.  Continue supportive care.  Speech therapy to evaluate for level of aspiration.  Patient is unable to participate in any respiratory physio including incentive spirometry. 07/17/2023: Complete course of antibiotics.   Altered mental status, likely dementia with behavioral  disturbances: Patient on trazodone, Klonopin that will be continued.  Haloperidol as needed.   Today her symptoms are better controlled.   Will consult palliative care and hospice team as well to help control her symptoms before going back to the nursing home. Soft restraints as needed to protect lines and to protect from injuries.  Will try medications and try to take of the restraints.   Hypertension: Reported in the emergency room.  Currently normotensive.  Not on any high antihypertensives.      Discharge Planning: Ongoing, d/c once IVAB complete, likely back to facility Family Contact: Talked with husband via phone. IDT: Updated Goals of Care: DNR  Doreatha Martin, RN, [facility] Liaison [phone]

## 2023-07-18 NOTE — Progress Notes (Signed)
PROGRESS NOTE    ELYSIA Fitzpatrick  ZOX:[id number] DOB: 1951/07/28 DOA: 07/14/2023 PCP: Patient, No Pcp Per    Brief Narrative:  72 year old with history of advanced dementia, hypertension, hyperlipidemia and depression and GERD brought from nursing home with altered mental status.  Probably reported falls at nursing home.  Reportedly with EMS blood pressure was 80 and improved with fluids.  Combative in the ER.  Normotensive.  Low-grade fever with temperature 100.2.  Infection workup was negative.  CT scan of the head with no complications.  CT chest abdomen pelvis with no acute findings except focal area of peribronchial inflation of the right mid lung suspicious of aspiration.  Started on Rocephin and azithromycin and admitted  to the hospital.  07/18/2023: Patient seen.  Patient is resting quietly.  Low normal blood pressure noted.  Previous UA revealed high urine specific gravity.  Will start gentle hydration.  Will monitor patient's blood sugar.  Subjective: -Resting quietly. -No complaints.  Assessment & Plan:   Right middle lobe pneumonia: Likely aspiration pneumonia: -Continue Rocephin and azithromycin today.   -Continue supportive care.   -Speech therapy input. -Patient is unable to participate in any respiratory physio including incentive spirometry. 07/18/2023: Complete course of antibiotics.  Altered mental status, likely dementia with behavioral disturbances: -Minimize mind altering medications. -Discontinue trazodone. -Decrease dose of gabapentin to 300 Mg p.o. 3 times daily. -Supportive care. -Palliative care input is appreciated.    Hypertension: -Reported in the emergency room. -Currently, blood pressure is low normal. -Gentle hydration. -Continue to monitor closely.     Goal of care: See discussion with patient's husband 10/31.  Patient with quite advanced dementia and behavior issues.  Plan is to control her symptoms and get her back to nursing home with  hospice in place.    DVT prophylaxis: enoxaparin (LOVENOX) injection 40 mg Start: 07/14/23 2200   Code Status: DNR Family Communication: none at the bedside.  Husband on the phone 10/31. Disposition Plan: Status is: Inpatient Remains inpatient appropriate because: altered mental status , on restraints      Consultants:  Palliative care  Procedures:  None   Antimicrobials:  Rocephin and azithromycin 10/30---     Objective: Vitals:   07/17/23 1338 07/17/23 2255 07/18/23 0536 07/18/23 1428  BP: (!) 100/57 (!) 116/55 (!) 101/50 (!) 86/45  Pulse: 100 85 63 67  Resp: 16 16 16 18   Temp: 98.1 F (36.7 C) 99.8 F (37.7 C) 99.2 F (37.3 C) 98.5 F (36.9 C)  TempSrc:  Oral    SpO2: 93% 98% 99% 99%  Weight:      Height:        Intake/Output Summary (Last 24 hours) at 07/18/2023 1734 Last data filed at 07/18/2023 1230 Gross per 24 hour  Intake 560 ml  Output --  Net 560 ml   Filed Weights   07/14/23 1503  Weight: 59 kg    Examination:  General exam: Patient is resting quietly.   Respiratory system: Clear to auscultation.  Cardiovascular system: S1 & S2 heard, RRR.  No pedal edema. Gastrointestinal system: Soft.  Nontender.  Bowel sound present. Central nervous system: Resting quietly.. Extremities: No leg edema.   Data Reviewed: I have personally reviewed following labs and imaging studies  CBC: Recent Labs  Lab 07/14/23 1535 07/15/23 0350  WBC 10.5 6.2  NEUTROABS 8.7*  --   HGB 11.3* 10.8*  HCT 33.5* 34.1*  MCV 93.6 99.7  PLT 253 153   Basic Metabolic Panel: Recent Labs  Lab 07/14/23 1535 07/15/23 0350  NA 136 136  K 4.3 4.1  CL 103 107  CO2 24 23  GLUCOSE 118* 91  BUN 18 13  CREATININE 0.50 0.40*  CALCIUM 9.1 8.6*   GFR: Estimated Creatinine Clearance: 60.1 mL/min (A) (by C-G formula based on SCr of 0.4 mg/dL (L)). Liver Function Tests: Recent Labs  Lab 07/14/23 1535  AST 20  ALT 18  ALKPHOS 59  BILITOT 0.7  PROT 6.5  ALBUMIN  3.7   No results for input(s): "LIPASE", "AMYLASE" in the last 168 hours. No results for input(s): "AMMONIA" in the last 168 hours. Coagulation Profile: No results for input(s): "INR", "PROTIME" in the last 168 hours. Cardiac Enzymes: No results for input(s): "CKTOTAL", "CKMB", "CKMBINDEX", "TROPONINI" in the last 168 hours. BNP (last 3 results) No results for input(s): "PROBNP" in the last 8760 hours. HbA1C: No results for input(s): "HGBA1C" in the last 72 hours. CBG: Recent Labs  Lab 07/14/23 1542  GLUCAP 77   Lipid Profile: No results for input(s): "CHOL", "HDL", "LDLCALC", "TRIG", "CHOLHDL", "LDLDIRECT" in the last 72 hours. Thyroid Function Tests: No results for input(s): "TSH", "T4TOTAL", "FREET4", "T3FREE", "THYROIDAB" in the last 72 hours. Anemia Panel: No results for input(s): "VITAMINB12", "FOLATE", "FERRITIN", "TIBC", "IRON", "RETICCTPCT" in the last 72 hours. Sepsis Labs: Recent Labs  Lab 07/14/23 1548  LATICACIDVEN 1.9    Recent Results (from the past 240 hour(s))  Culture, blood (routine x 2)     Status: None (Preliminary result)   Collection Time: 07/14/23  3:35 PM   Specimen: BLOOD  Result Value Ref Range Status   Specimen Description   Final    BLOOD BLOOD RIGHT FOREARM Performed at [facility], [address], [place], [place] [address]    Special Requests   Final    BOTTLES DRAWN AEROBIC AND ANAEROBIC Blood Culture adequate volume Performed at [facility], [address], [place], [place] [address]    Culture   Final    NO GROWTH 4 DAYS Performed at [facility], [address] [place], [place] [address]    Report Status PENDING  Incomplete  Urine Culture     Status: None   Collection Time: 07/14/23  3:41 PM   Specimen: Urine, Catheterized  Result Value Ref Range Status   Specimen Description   Final    URINE, CATHETERIZED Performed at [facility], [address],  [place], [place] [address]    Special Requests   Final    NONE Performed at [facility], [address], [place], [place] [address]    Culture   Final    NO GROWTH Performed at [facility], [address], [place], [place] [address]    Report Status 07/15/2023 FINAL  Final  Culture, blood (routine x 2)     Status: Abnormal   Collection Time: 07/14/23  3:44 PM   Specimen: BLOOD LEFT FOREARM  Result Value Ref Range Status   Specimen Description   Final    BLOOD LEFT FOREARM Performed at [facility], [address] [place], [place] [address]    Special Requests   Final    BOTTLES DRAWN AEROBIC AND ANAEROBIC Blood Culture results may not be optimal due to an excessive volume of blood received in culture bottles Performed at [facility], [address] [place], [place] [address]    Culture  Setup Time   Final  GRAM POSITIVE COCCI IN CLUSTERS AEROBIC BOTTLE ONLY CRITICAL RESULT CALLED TO, READ BACK BY AND VERIFIED WITH: PHARMD AHN PHAM ON 07/15/23 @ 1943 BY DRT    Culture (A)  Final    STAPHYLOCOCCUS EPIDERMIDIS THE SIGNIFICANCE OF ISOLATING THIS ORGANISM FROM A SINGLE SET OF BLOOD CULTURES WHEN MULTIPLE SETS ARE DRAWN IS UNCERTAIN. PLEASE NOTIFY THE MICROBIOLOGY DEPARTMENT WITHIN ONE WEEK IF SPECIATION AND SENSITIVITIES ARE REQUIRED. Performed at [facility], [address] [place], [place] [address]    Report Status 07/16/2023 FINAL  Final  Blood Culture ID Panel (Reflexed)     Status: Abnormal   Collection Time: 07/14/23  3:44 PM  Result Value Ref Range Status   Enterococcus faecalis NOT DETECTED NOT DETECTED Final   Enterococcus Faecium NOT DETECTED NOT DETECTED Final   Listeria monocytogenes NOT DETECTED NOT DETECTED Final   Staphylococcus species DETECTED (A) NOT DETECTED Final    Comment: CRITICAL RESULT CALLED TO, READ BACK BY AND VERIFIED WITH: PHARMD AHN PHAM ON 07/15/23 @ 1943 BY DRT    Staphylococcus aureus  (BCID) NOT DETECTED NOT DETECTED Final   Staphylococcus epidermidis DETECTED (A) NOT DETECTED Final    Comment: Methicillin (oxacillin) resistant coagulase negative staphylococcus. Possible blood culture contaminant (unless isolated from more than one blood culture draw or clinical case suggests pathogenicity). No antibiotic treatment is indicated for blood  culture contaminants. CRITICAL RESULT CALLED TO, READ BACK BY AND VERIFIED WITH: PHARMD AHN PHAM ON 07/15/23 @ 1943 BY DRT    Staphylococcus lugdunensis NOT DETECTED NOT DETECTED Final   Streptococcus species NOT DETECTED NOT DETECTED Final   Streptococcus agalactiae NOT DETECTED NOT DETECTED Final   Streptococcus pneumoniae NOT DETECTED NOT DETECTED Final   Streptococcus pyogenes NOT DETECTED NOT DETECTED Final   A.calcoaceticus-baumannii NOT DETECTED NOT DETECTED Final   Bacteroides fragilis NOT DETECTED NOT DETECTED Final   Enterobacterales NOT DETECTED NOT DETECTED Final   Enterobacter cloacae complex NOT DETECTED NOT DETECTED Final   Escherichia coli NOT DETECTED NOT DETECTED Final   Klebsiella aerogenes NOT DETECTED NOT DETECTED Final   Klebsiella oxytoca NOT DETECTED NOT DETECTED Final   Klebsiella pneumoniae NOT DETECTED NOT DETECTED Final   Proteus species NOT DETECTED NOT DETECTED Final   Salmonella species NOT DETECTED NOT DETECTED Final   Serratia marcescens NOT DETECTED NOT DETECTED Final   Haemophilus influenzae NOT DETECTED NOT DETECTED Final   Neisseria meningitidis NOT DETECTED NOT DETECTED Final   Pseudomonas aeruginosa NOT DETECTED NOT DETECTED Final   Stenotrophomonas maltophilia NOT DETECTED NOT DETECTED Final   Candida albicans NOT DETECTED NOT DETECTED Final   Candida auris NOT DETECTED NOT DETECTED Final   Candida glabrata NOT DETECTED NOT DETECTED Final   Candida krusei NOT DETECTED NOT DETECTED Final   Candida parapsilosis NOT DETECTED NOT DETECTED Final   Candida tropicalis NOT DETECTED NOT DETECTED  Final   Cryptococcus neoformans/gattii NOT DETECTED NOT DETECTED Final   Methicillin resistance mecA/C DETECTED (A) NOT DETECTED Final    Comment: CRITICAL RESULT CALLED TO, READ BACK BY AND VERIFIED WITH: PHARMD AHN PHAM ON 07/15/23 @ 1943 BY DRT Performed at [facility], [address] [place], [place] [address]   Resp panel by RT-PCR (RSV, Flu A&B, Covid) Anterior Nasal Swab     Status: None   Collection Time: 07/14/23  3:54 PM   Specimen: Anterior Nasal Swab  Result Value Ref Range Status   SARS Coronavirus 2 by RT PCR NEGATIVE NEGATIVE Final  Comment: (NOTE) SARS-CoV-2 target nucleic acids are NOT DETECTED.  The SARS-CoV-2 RNA is generally detectable in upper respiratory specimens during the acute phase of infection. The lowest concentration of SARS-CoV-2 viral copies this assay can detect is 138 copies/mL. A negative result does not preclude SARS-Cov-2 infection and should not be used as the sole basis for treatment or other patient management decisions. A negative result may occur with  improper specimen collection/handling, submission of specimen other than nasopharyngeal swab, presence of viral mutation(s) within the areas targeted by this assay, and inadequate number of viral copies(<138 copies/mL). A negative result must be combined with clinical observations, patient history, and epidemiological information. The expected result is Negative.  Fact Sheet for Patients:  BloggerCourse.com  Fact Sheet for Healthcare Providers:  SeriousBroker.it  This test is no t yet approved or cleared by the [place] FDA and  has been authorized for detection and/or diagnosis of SARS-CoV-2 by FDA under an Emergency Use Authorization (EUA). This EUA will remain  in effect (meaning this test can be used) for the duration of the COVID-19 declaration under Section 564(b)(1) of the Act, 21 U.S.C.section 360bbb-3(b)(1), unless  the authorization is terminated  or revoked sooner.       Influenza A by PCR NEGATIVE NEGATIVE Final   Influenza B by PCR NEGATIVE NEGATIVE Final    Comment: (NOTE) The Xpert Xpress SARS-CoV-2/FLU/RSV plus assay is intended as an aid in the diagnosis of influenza from Nasopharyngeal swab specimens and should not be used as a sole basis for treatment. Nasal washings and aspirates are unacceptable for Xpert Xpress SARS-CoV-2/FLU/RSV testing.  Fact Sheet for Patients: BloggerCourse.com  Fact Sheet for Healthcare Providers: SeriousBroker.it  This test is not yet approved or cleared by the [place] FDA and has been authorized for detection and/or diagnosis of SARS-CoV-2 by FDA under an Emergency Use Authorization (EUA). This EUA will remain in effect (meaning this test can be used) for the duration of the COVID-19 declaration under Section 564(b)(1) of the Act, 21 U.S.C. section 360bbb-3(b)(1), unless the authorization is terminated or revoked.     Resp Syncytial Virus by PCR NEGATIVE NEGATIVE Final    Comment: (NOTE) Fact Sheet for Patients: BloggerCourse.com  Fact Sheet for Healthcare Providers: SeriousBroker.it  This test is not yet approved or cleared by the [place] FDA and has been authorized for detection and/or diagnosis of SARS-CoV-2 by FDA under an Emergency Use Authorization (EUA). This EUA will remain in effect (meaning this test can be used) for the duration of the COVID-19 declaration under Section 564(b)(1) of the Act, 21 U.S.C. section 360bbb-3(b)(1), unless the authorization is terminated or revoked.  Performed at [facility], [address], [place], [place] [address]          Radiology Studies: No results found.      Scheduled Meds:  azithromycin  500 mg Oral QHS   enoxaparin (LOVENOX) injection  40 mg Subcutaneous  Q24H   gabapentin  300 mg Oral TID   mirtazapine  7.5 mg Oral QHS   propranolol  10 mg Oral BID   Continuous Infusions:  cefTRIAXone (ROCEPHIN)  IV 2 g (07/18/23 1534)   dextrose 5 % and 0.45 % NaCl with KCl 10 mEq/L       LOS: 4 days    Time spent: 35 minutes    Barnetta Chapel, MD [facility]

## 2023-07-18 NOTE — Progress Notes (Signed)
Daily Progress Note   Patient Name: KHADY VANDENBERG       Date: 07/18/2023 DOB: 05-Dec-1950  Age: 72 y.o. MRN#: [id number] Attending Physician: Barnetta Chapel, MD Primary Care Physician: Patient, No Pcp Per Admit Date: 07/14/2023  Reason for Consultation/Follow-up: Non pain symptom management  Subjective: Resting in bed, does not appear to be in distress.  Chart reviewed.  Medication history noted.  No family at bedside.   Length of Stay: 4  Current Medications: Scheduled Meds:   azithromycin  500 mg Oral QHS   enoxaparin (LOVENOX) injection  40 mg Subcutaneous Q24H   gabapentin  400 mg Oral TID   mirtazapine  7.5 mg Oral QHS   propranolol  10 mg Oral BID   traZODone  100 mg Oral QHS   traZODone  50 mg Oral Daily   And   traZODone  25 mg Oral Q1500    Continuous Infusions:  cefTRIAXone (ROCEPHIN)  IV Stopped (07/17/23 1522)    PRN Meds: acetaminophen, acetaminophen, clonazePAM, haloperidol lactate  Physical Exam         Noted to be resting comfortably No acute distress Appears to have regular work of breathing  Vital Signs: BP (!) 101/50 (BP Location: Left Arm)   Pulse 63   Temp 99.2 F (37.3 C)   Resp 16   Ht 5\' 6"  (1.676 m)   Wt 59 kg   SpO2 99%   BMI 20.99 kg/m  SpO2: SpO2: 99 % O2 Device: O2 Device: Room Air O2 Flow Rate:    Intake/output summary:  Intake/Output Summary (Last 24 hours) at 07/18/2023 1323 Last data filed at 07/18/2023 1053 Gross per 24 hour  Intake 700 ml  Output --  Net 700 ml   LBM: Last BM Date : 07/15/23 Baseline Weight: Weight: 59 kg Most recent weight: Weight: 59 kg       Palliative Assessment/Data:      Patient Active Problem List   Diagnosis Date Noted   Dementia with behavioral disturbance (HCC) 07/14/2023    CAP (community acquired pneumonia) 07/14/2023   Community acquired pneumonia 07/14/2023   Thoracic aortic aneurysm without rupture (HCC) 07/14/2023   Normochromic normocytic anemia 09/09/2017   Acute encephalopathy 09/08/2017   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 Recommend continuation of hospice services in the outpatient setting.   Code Status:    Code Status Orders  (From admission, onward)           Start     Ordered   07/14/23 2133  Do not attempt resuscitation (DNR)- Limited -Do Not Intubate (DNI)  (Code Status)  Continuous       Question Answer Comment  If pulseless and not breathing No CPR or chest compressions.   In Pre-Arrest Conditions (Patient Is Breathing and Has A Pulse) Do not intubate. Provide all appropriate non-invasive medical interventions. Avoid ICU transfer unless indicated or required.   Consent: Discussion documented in EHR or advanced directives reviewed      07/14/23 2132           Code Status History     Date Active Date Inactive Code Status Order ID Comments User Context   07/14/2023 2131 07/14/2023 2132 Full Code [id number]  Anselm Jungling, DO ED   09/09/2017 0320 09/11/2017 1756 DNR [id number]  Eduard Clos, MD Inpatient   10/30/2015 1957 11/02/2015 1742 Full Code [id number]  Marinda Elk, MD ED       Prognosis:  < 6 months  Discharge Planning: Facility with  hospice  Care plan was discussed with IDT  Thank you for allowing the Palliative Medicine Team to assist in the care of this patient.  Low MDM     Greater than 50%  of this time was spent counseling and coordinating care related to the above assessment and plan.  Rosalin Hawking, MD  Please contact Palliative Medicine Team phone at [phone] for questions and concerns.

## 2023-07-18 NOTE — Plan of Care (Signed)

## 2023-07-19 DIAGNOSIS — F03918 Unspecified dementia, unspecified severity, with other behavioral disturbance: Secondary | ICD-10-CM

## 2023-07-19 DIAGNOSIS — Z515 Encounter for palliative care: Secondary | ICD-10-CM

## 2023-07-19 DIAGNOSIS — G9341 Metabolic encephalopathy: Secondary | ICD-10-CM | POA: Diagnosis not present

## 2023-07-19 LAB — CBC WITH DIFFERENTIAL/PLATELET
Abs Immature Granulocytes: 0.01 10*3/uL (ref 0.00–0.07)
Basophils Absolute: 0.1 10*3/uL (ref 0.0–0.1)
Basophils Relative: 1 %
Eosinophils Absolute: 0.2 10*3/uL (ref 0.0–0.5)
Eosinophils Relative: 4 %
HCT: 35.4 % — ABNORMAL LOW (ref 36.0–46.0)
Hemoglobin: 11.4 g/dL — ABNORMAL LOW (ref 12.0–15.0)
Immature Granulocytes: 0 %
Lymphocytes Relative: 30 %
Lymphs Abs: 1.3 10*3/uL (ref 0.7–4.0)
MCH: 31.1 pg (ref 26.0–34.0)
MCHC: 32.2 g/dL (ref 30.0–36.0)
MCV: 96.5 fL (ref 80.0–100.0)
Monocytes Absolute: 0.3 10*3/uL (ref 0.1–1.0)
Monocytes Relative: 7 %
Neutro Abs: 2.4 10*3/uL (ref 1.7–7.7)
Neutrophils Relative %: 58 %
Platelets: 178 10*3/uL (ref 150–400)
RBC: 3.67 MIL/uL — ABNORMAL LOW (ref 3.87–5.11)
RDW: 12.8 % (ref 11.5–15.5)
WBC: 4.3 10*3/uL (ref 4.0–10.5)
nRBC: 0 % (ref 0.0–0.2)

## 2023-07-19 LAB — RENAL FUNCTION PANEL
Albumin: 3.4 g/dL — ABNORMAL LOW (ref 3.5–5.0)
Anion gap: 10 (ref 5–15)
BUN: 14 mg/dL (ref 8–23)
CO2: 25 mmol/L (ref 22–32)
Calcium: 9.1 mg/dL (ref 8.9–10.3)
Chloride: 104 mmol/L (ref 98–111)
Creatinine, Ser: 0.6 mg/dL (ref 0.44–1.00)
GFR, Estimated: >60 mL/min (ref >60)
Glucose, Bld: 95 mg/dL (ref 70–99)
Phosphorus: 3.3 mg/dL (ref 2.5–4.6)
Potassium: 3.8 mmol/L (ref 3.5–5.1)
Sodium: 139 mmol/L (ref 135–145)

## 2023-07-19 LAB — CULTURE, BLOOD (ROUTINE X 2)
Culture: NO GROWTH
Special Requests: ADEQUATE

## 2023-07-19 LAB — MAGNESIUM: Magnesium: 2.1 mg/dL (ref 1.7–2.4)

## 2023-07-19 MED ORDER — QUETIAPINE FUMARATE 25 MG PO TABS
25.0000 mg | ORAL_TABLET | Freq: Every day | ORAL | Status: DC
  Administered 2023-07-19: 25 mg via ORAL
  Filled 2023-07-19: qty 1

## 2023-07-19 MED ORDER — QUETIAPINE FUMARATE 50 MG PO TABS
50.0000 mg | ORAL_TABLET | ORAL | Status: DC
  Administered 2023-07-19: 50 mg via ORAL
  Filled 2023-07-19: qty 1

## 2023-07-19 MED ORDER — QUETIAPINE FUMARATE 25 MG PO TABS
25.0000 mg | ORAL_TABLET | Freq: Every day | ORAL | Status: DC
  Administered 2023-07-20: 25 mg via ORAL
  Filled 2023-07-19: qty 1

## 2023-07-19 NOTE — Progress Notes (Signed)
PMT no charge note.   Chart reviewed, secure chat conversations with hospice liaison as well as Psychiatry colleague.  Medications noted, now on Seroquel. Hospice liaisons following, she has altered mental status, dementia with behavioral disturbances.  DNR Back to facility with hospice once behavioral symptoms controlled.  No new inpatient PMT specific recommendations at this time.  No charge Rosalin Hawking MD [facility].

## 2023-07-19 NOTE — Plan of Care (Signed)
  Problem: Nutrition: Goal: Adequate nutrition will be maintained Outcome: Progressing   Problem: Elimination: Goal: Will not experience complications related to urinary retention Outcome: Progressing   

## 2023-07-19 NOTE — Plan of Care (Signed)
  Problem: Safety: Goal: Non-violent Restraint(s) Outcome: Progressing   Problem: Coping: Goal: Level of anxiety will decrease Outcome: Progressing   Problem: Elimination: Goal: Will not experience complications related to urinary retention Outcome: Progressing   Problem: Pain Management: Goal: General experience of comfort will improve Outcome: Progressing

## 2023-07-19 NOTE — Consult Note (Signed)
Tina Fitzpatrick Psychiatry Consult Evaluation  Service Date: July 19, 2023 LOS:  LOS: 5 days    Primary Psychiatric Diagnoses  Dementia  Assessment  Tina Fitzpatrick is a 72 y.o. female admitted medically for 07/14/2023  2:54 PM for behavioral disturbances and agitation in the setting of dementia. Otherwise PMH is significant for DDD, HLD, HTN, and kidney stones. Psychiatry was consulted for agitation related to advance dementia, medication management  by Dr. Jerral Fitzpatrick.   At this time, the patient's presentation is most consistent with mixed delirium in the setting of dementia, most likely due to multiple etiologies including but not limited to infection, medications, pain, altered sleep/wake cycle, and limited mobility. She was significnatly more agitated at admission, and was found to have community-acquired PNA since treated with antibiotics. She was started on quetiapine prior to my seeing her (along with other med changes listed below) which seems to have had a significant impact on agitation. During this time period, minimization of delirogenic insults will be of utmost importance; this includes promoting the normal circadian cycle, minimizing lines/tubes, avoiding deliriogenic medications such as benzodiazepines and anticholinergic medications, and frequently reorienting the patient. Symptomatic treatment for agitation can be provided by antipsychotic medications, though it is important to remember that these do not treat the underlying etiology of delirium. Notably, there can be a time lag effect between treatment of a medical problem and resolution of delirium. This time lag effect may be of longer duration in the elderly, and those with underlying cognitive impairment as in this pt on hospice for known dementia.   I have removed depression from problem list - husband of 30 years has no knowledge of this dx.   Diagnoses:  Active Hospital problems: Principal Problem:   CAP (community  acquired pneumonia) Active Problems:   Goals of care, counseling/discussion   Acute metabolic encephalopathy   Hypotension   Dementia with behavioral disturbance (HCC)   Community acquired pneumonia   Thoracic aortic aneurysm without rupture (HCC)   Palliative care by specialist     Plan   ## Psychiatric Medication Recommendations:  -- CHANGE quetiapine from 50 mg/25 mg to 25 mg BID  Otherwise, agree with thoughtful changes by Dr. Dartha Fitzpatrick including d/c of trazodone and reduction of gabapentin.   ## Medical Decision Making Capacity:  Clearly lacks for most decisions  ## Further Work-up:  -- None currently -- removed depression from problem list  -- Last EKG in March; re-ordered.   ## Disposition:  -- There are no current psychiatric contraindications to discharge at this time  ## Behavioral / Environmental:  --   No specific recommendations at this time.  or Delirium Precautions: Delirium Interventions for Nursing and Staff: - RN to open blinds every AM. - To Bedside: Glasses, hearing aide, and pt's own shoes. Make available to patients. when possible and encourage use. - Encourage po fluids when appropriate, keep fluids within reach. - OOB to chair with meals. - Passive ROM exercises to all extremities with AM & PM care. - RN to assess orientation to person, time and place QAM and PRN. - Recommend extended visitation hours with familiar family/friends as feasible. - Staff to minimize disturbances at night. Turn off television when pt asleep or when not in use.     ## Safety and Observation Level:  - Based on my clinical evaluation, I estimate the patient to be at high risk of self harm in the current setting largely due to decreased safety awareness (asked a few  times about getting out of bed) - Defer level of observation to primary team.   Suicide risk assessment  Patient has following modifiable risk factors for suicide: agitation,  which we are addressing by  managing agitation medications.   Patient has following non-modifiable or demographic risk factors for suicide: dementia  Patient has the following protective factors against suicide: Supportive family, no history of suicide attempts, and no history of NSSIB, no hx depression   Thank you for this consult request. Recommendations have been communicated to the primary team.  We will see x1-2 more times and later sign off  Tina Fitzpatrick  Psychiatric and Social History   Relevant Aspects of Hospital Course:  Admitted on 07/14/2023 for treatment of CAP/aspiration PNA and agitation. They have responded fairly well to abx and med changes by primary team.   Patient Report:  Patient seen in AM.  Was sound asleep when I stop by around 9 and had to be woken from a light sleep around 11.  Unfortunately, she is oriented to self only.  Her responses to questions are mostly off topic and unrelated to the question asked.  She is quite soft-spoken and difficult to understand.  She did not have any rigidity or stiffness on physical exam.  Psych ROS:  Pt unable to give any reliable history.   Collateral information:  Contacted husband for history below. He has no questions about current medications.   Psychiatric History:  Information collected from husband  Prev Dx/Sx: Husband unaware of any prior depression dx.  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 Enjoyed NASCAR, spending time with family, cooking.  Married x 30 years.  Living Situation: In facility. Decline fairly precipitous. Went to work with her husband for a year at the start of her illness before that became untenable.  Access to weapons: in hospice facility  Substance History Tobacco use: Used to smoke, quit 20 years ago Alcohol use: no Drug use: no   Exam Findings    Psychiatric Specialty Exam:  Presentation  General Appearance: Disheveled (thin)  Eye Contact:Fair  Speech:Clear and Coherent  Speech Volume:Decreased  Handedness:No data recorded  Mood and Affect  Mood:-- (did not state)  Affect:-- (anxious)   Thought Process  Thought Processes:Disorganized; Irrevelant  Descriptions of Associations:Loose  Orientation:Partial  Thought Content:-- (devoid of overt delusions/paranoia)  Hallucinations:Hallucinations: None  Ideas of Reference:None  Suicidal Thoughts:Suicidal Thoughts: -- (Did not endorse)  Homicidal Thoughts:Homicidal Thoughts: -- (Did not endorse)   Sensorium  Memory:Immediate Poor; Recent Poor; Remote Poor  Judgment:Poor  Insight:Lacking   Executive Functions  Concentration:Poor  Attention Span:Poor  Recall:Poor  Fund of Knowledge:Poor  Language:Poor   Psychomotor Activity  Psychomotor Activity:Psychomotor Activity: Psychomotor Retardation   Assets  Assets:-- (Supportive staff)   Sleep  Sleep:Sleep: Fair (A little sedated)    Physical Exam: Vital signs:  Temp:  [97.7 F (36.5 C)-98.7 F (37.1 C)] 97.7 F (36.5 C) (11/04 1307) Pulse Rate:  [56-77] 59 (11/04 1307) Resp:  [16-18] 16 (11/04 1307) BP: (102-125)/(55-80) 102/55 (11/04 1307) SpO2:  [96 %-100 %] 100 % (11/04 0424) Physical Exam HENT:     Head: Normocephalic.  Eyes:     Conjunctiva/sclera: Conjunctivae normal.  Pulmonary:     Effort: Pulmonary effort is normal.  Neurological:     Mental Status: She is alert.  Blood pressure (!) 102/55, pulse (!) 59, temperature 97.7 F (36.5 C), resp. rate 16, height 5\' 6"  (1.676 m), weight 59 kg, SpO2 100%. Body mass index is 20.99 kg/m.   Other History   These have been pulled in through the EMR, reviewed, and updated if appropriate.   Family History:  The patient's family history includes Heart attack in her mother.  Medical History: Past Medical History:  Diagnosis  Date   DDD (degenerative disc disease), cervical    Dementia (HCC)    Depression    GERD (gastroesophageal reflux disease)    Hyperlipidemia    Hypertension    Kidney stones     Surgical History: Past Surgical History:  Procedure Laterality Date   kidney stones  1988   NECK SURGERY  2008   Plate/Screws    Medications:   Current Facility-Administered Medications:    acetaminophen (TYLENOL) suppository 650 mg, 650 mg, Rectal, Q4H PRN, Tu, Ching T, DO   acetaminophen (TYLENOL) tablet 650 mg, 650 mg, Oral, Q6H PRN, Tu, Ching T, DO   clonazePAM (KLONOPIN) tablet 0.5 mg, 0.5 mg, Oral, Daily PRN, [name], MD, 0.5 mg at 07/18/23 1608   enoxaparin (LOVENOX) injection 40 mg, 40 mg, Subcutaneous, Q24H, Tu, Ching T, DO, 40 mg at 07/18/23 2247   gabapentin (NEURONTIN) capsule 300 mg, 300 mg, Oral, TID, Berton Mount I, MD, 300 mg at 07/19/23 1557   haloperidol lactate (HALDOL) injection 2 mg, 2 mg, Intravenous, Q6H PRN, [name], MD, 2 mg at 07/18/23 1609   mirtazapine (REMERON SOL-TAB) disintegrating tablet 7.5 mg, 7.5 mg, Oral, QHS, Anwar, Zeba, MD, 7.5 mg at 07/18/23 2246   propranolol (INDERAL) tablet 10 mg, 10 mg, Oral, BID, Ghimire, Kuber, MD, 10 mg at 07/19/23 0759   QUEtiapine (SEROQUEL) tablet 25 mg, 25 mg, Oral, QHS, Ghimire, Lyndel Safe, MD   [START ON 07/20/2023] QUEtiapine (SEROQUEL) tablet 25 mg, 25 mg, Oral, Daily, Glenn Gullickson A  Allergies: No Known Allergies

## 2023-07-19 NOTE — Progress Notes (Signed)
[name] [address] Hospitalized Hospice Patient Visit   Tina Fitzpatrick is a current [facility] hospice patient with a terminal diagnosis of severe protein calorie malnutrition. She had a fall in her facility and was sent to ED for evaluation after altered mental status. She was admitted to the hospital 10.30.24 with a diagnosis of acute metabolic encephalopathy and pneumonia. Per Dr. Patric Dykes with [facility] this is a related hospital admission.   Visited patient at bedside. She is calm and bedside nurse reports she has eaten breakfast and has not required restraints today. Spoke with patient's husband, [name], via phone to update him on condition.  He voiced understanding and appreciation for keeping him updated.   Patient is inpatient appropriate due to need for IVAB and treatment of acute encephalopathy.  Vital Signs: 98.4/56/17     125/69    O2 100% RA  I&O: 1405/600  Abnormal labs: Albumin 3.4, HGB 11.4.2024  Diagnostics: None New  IV/PRN Meds: Haldol 2mg  IV x 1, Klonopin 0.5mg  x 1, Rocephin 2g IV q 24  Problem List: Right middle lobe pneumonia: Likely aspiration pneumonia.  Does not have any pulmonary symptoms.  She received 4 days of antibiotics.  She is pulling out her IV lines.  Discontinue all antibiotics.     Altered mental status, dementia with behavioral disturbances: Patient on trazodone, Klonopin at home.  Reported intermittent agitation. Continue fall precautions.  Delirium precautions. Start Seroquel 25 in the morning and 25 at night.  She may need more symptom control before going back to memory care unit.  Psychiatry to see today. Soft restraints as needed to protect lines and to protect from injuries.  Will try medications and try to take of the restraints.   Discharge Planning: Ongoing, d/c once IVAB complete and restraints not needed, likely back to facility tomorrow.  Family Contact: Talked with husband via phone.  IDT:  Updated  Goals of Care: DNR   Glenna Fellows, RN, [facility] Liaison [phone]

## 2023-07-19 NOTE — Progress Notes (Signed)
PROGRESS NOTE    Tina Fitzpatrick  WUJ:[id number] DOB: 10-05-1950 DOA: 07/14/2023 PCP: Patient, No Pcp Per    Brief Narrative:  72 year old with history of advanced dementia, hypertension, hyperlipidemia and depression and GERD brought from nursing home with altered mental status.  Probably reported falls at nursing home.  Reportedly with EMS blood pressure was 80 and improved with fluids.  Combative in the ER.  Normotensive.  Low-grade fever with temperature 100.2.  Infection workup was negative.  CT scan of the head with no complications.  CT chest abdomen pelvis with no acute findings except focal area of peribronchial infiltration of the right mid lung suspicious of aspiration.  Started on Rocephin and azithromycin and admitted  to the hospital.  Subjective:  Patient seen and examined.  No overnight events.  She was mostly sleepy on my exam.  Remains on soft restraint overnight and taken off in the morning.  No family at bedside. Over the weekend, she was taken off from her long-term dose of trazodone and Klonopin was reduced. Will start patient on Seroquel 25 mg twice daily.  Will also consult psychiatry team to evaluate to help control symptoms. Case discussed with psychiatry.   Assessment & Plan:   Right middle lobe pneumonia: Likely aspiration pneumonia.  Does not have any pulmonary symptoms.  She received 4 days of antibiotics.  She is pulling out her IV lines.  Discontinue all antibiotics.    Altered mental status, dementia with behavioral disturbances: Patient on trazodone, Klonopin at home.  Reported intermittent agitation. Continue fall precautions.  Delirium precautions. Start Seroquel 25 in the morning and 25 at night.  She may need more symptom control before going back to memory care unit.  Psychiatry to see today. Soft restraints as needed to protect lines and to protect from injuries.  Will try medications and try to take of the restraints.  Hypertension: Reported in  the emergency room.  Currently normotensive.  Not on any  antihypertensives.      DVT prophylaxis: enoxaparin (LOVENOX) injection 40 mg Start: 07/14/23 2200   Code Status: DNR Family Communication: none at the bedside.   Disposition Plan: Status is: Inpatient Remains inpatient appropriate because: Altered mental status.  Episodic agitation.     Consultants:  Palliative care Psychiatry.  Procedures:  None   Antimicrobials:  Rocephin and azithromycin 10/30--- 11/4.     Objective: Vitals:   07/18/23 1428 07/18/23 1911 07/18/23 2002 07/19/23 0424  BP: (!) 86/45 118/73 118/80 125/69  Pulse: 67 76 77 (!) 56  Resp: 18 18 18 17   Temp: 98.5 F (36.9 C) 98.7 F (37.1 C) 98.4 F (36.9 C) 98.4 F (36.9 C)  TempSrc:    Oral  SpO2: 99% 96% 98% 100%  Weight:      Height:        Intake/Output Summary (Last 24 hours) at 07/19/2023 1134 Last data filed at 07/19/2023 0958 Gross per 24 hour  Intake 1675.43 ml  Output 700 ml  Net 975.43 ml   Filed Weights   07/14/23 1503  Weight: 59 kg    Examination:  General exam: Appears comfortable and sleepy. Respiratory system:  No added sounds. Cardiovascular system: S1 & S2 heard, RRR.  No pedal edema. Gastrointestinal system: Soft.  Nontender.  Bowel sound present. Central nervous system: Alert but not oriented.  Quiet and calm at rest. Extremities: Symmetric 5 x 5 power.   Data Reviewed: I have personally reviewed following labs and imaging studies  CBC: Recent Labs  Lab 07/14/23 1535 07/15/23 0350 07/19/23 0847  WBC 10.5 6.2 4.3  NEUTROABS 8.7*  --  2.4  HGB 11.3* 10.8* 11.4*  HCT 33.5* 34.1* 35.4*  MCV 93.6 99.7 96.5  PLT 253 153 178   Basic Metabolic Panel: Recent Labs  Lab 07/14/23 1535 07/15/23 0350 07/19/23 0847  NA 136 136 139  K 4.3 4.1 3.8  CL 103 107 104  CO2 24 23 25   GLUCOSE 118* 91 95  BUN 18 13 14   CREATININE 0.50 0.40* 0.60  CALCIUM 9.1 8.6* 9.1  MG  --   --  2.1  PHOS  --   --  3.3    GFR: Estimated Creatinine Clearance: 60.1 mL/min (by C-G formula based on SCr of 0.6 mg/dL). Liver Function Tests: Recent Labs  Lab 07/14/23 1535 07/19/23 0847  AST 20  --   ALT 18  --   ALKPHOS 59  --   BILITOT 0.7  --   PROT 6.5  --   ALBUMIN 3.7 3.4*   No results for input(s): "LIPASE", "AMYLASE" in the last 168 hours. No results for input(s): "AMMONIA" in the last 168 hours. Coagulation Profile: No results for input(s): "INR", "PROTIME" in the last 168 hours. Cardiac Enzymes: No results for input(s): "CKTOTAL", "CKMB", "CKMBINDEX", "TROPONINI" in the last 168 hours. BNP (last 3 results) No results for input(s): "PROBNP" in the last 8760 hours. HbA1C: No results for input(s): "HGBA1C" in the last 72 hours. CBG: Recent Labs  Lab 07/14/23 1542 07/18/23 1913  GLUCAP 77 119*   Lipid Profile: No results for input(s): "CHOL", "HDL", "LDLCALC", "TRIG", "CHOLHDL", "LDLDIRECT" in the last 72 hours. Thyroid Function Tests: No results for input(s): "TSH", "T4TOTAL", "FREET4", "T3FREE", "THYROIDAB" in the last 72 hours. Anemia Panel: No results for input(s): "VITAMINB12", "FOLATE", "FERRITIN", "TIBC", "IRON", "RETICCTPCT" in the last 72 hours. Sepsis Labs: Recent Labs  Lab 07/14/23 1548  LATICACIDVEN 1.9    Recent Results (from the past 240 hour(s))  Culture, blood (routine x 2)     Status: None (Preliminary result)   Collection Time: 07/14/23  3:35 PM   Specimen: BLOOD  Result Value Ref Range Status   Specimen Description   Final    BLOOD BLOOD RIGHT FOREARM Performed at [facility], [address], [place], [place] [address]    Special Requests   Final    BOTTLES DRAWN AEROBIC AND ANAEROBIC Blood Culture adequate volume Performed at [facility], [address] [place], [place] [address]    Culture   Final    NO GROWTH 4 DAYS Performed at [facility], [address], [place], [place] [address]    Report Status  PENDING  Incomplete  Urine Culture     Status: None   Collection Time: 07/14/23  3:41 PM   Specimen: Urine, Catheterized  Result Value Ref Range Status   Specimen Description   Final    URINE, CATHETERIZED Performed at [facility], [address] [place], [place] [address]    Special Requests   Final    NONE Performed at [facility], [address], [place], [place] [address]    Culture   Final    NO GROWTH Performed at [facility], [address] [place], [place] [address]    Report Status 07/15/2023 FINAL  Final  Culture, blood (routine x 2)     Status: Abnormal   Collection Time: 07/14/23  3:44 PM  Specimen: BLOOD LEFT FOREARM  Result Value Ref Range Status   Specimen Description   Final    BLOOD LEFT FOREARM Performed at [facility], [address], [place], [place] [address]    Special Requests   Final    BOTTLES DRAWN AEROBIC AND ANAEROBIC Blood Culture results may not be optimal due to an excessive volume of blood received in culture bottles Performed at [facility], [address] [place], [place] [address]    Culture  Setup Time   Final    GRAM POSITIVE COCCI IN CLUSTERS AEROBIC BOTTLE ONLY CRITICAL RESULT CALLED TO, READ BACK BY AND VERIFIED WITH: PHARMD AHN PHAM ON 07/15/23 @ 1943 BY DRT    Culture (A)  Final    STAPHYLOCOCCUS EPIDERMIDIS THE SIGNIFICANCE OF ISOLATING THIS ORGANISM FROM A SINGLE SET OF BLOOD CULTURES WHEN MULTIPLE SETS ARE DRAWN IS UNCERTAIN. PLEASE NOTIFY THE MICROBIOLOGY DEPARTMENT WITHIN ONE WEEK IF SPECIATION AND SENSITIVITIES ARE REQUIRED. Performed at [facility], [address], [place], [place] [address]    Report Status 07/16/2023 FINAL  Final  Blood Culture ID Panel (Reflexed)     Status: Abnormal   Collection Time: 07/14/23  3:44 PM  Result Value Ref Range Status   Enterococcus faecalis NOT DETECTED NOT DETECTED Final   Enterococcus Faecium NOT  DETECTED NOT DETECTED Final   Listeria monocytogenes NOT DETECTED NOT DETECTED Final   Staphylococcus species DETECTED (A) NOT DETECTED Final    Comment: CRITICAL RESULT CALLED TO, READ BACK BY AND VERIFIED WITH: PHARMD AHN PHAM ON 07/15/23 @ 1943 BY DRT    Staphylococcus aureus (BCID) NOT DETECTED NOT DETECTED Final   Staphylococcus epidermidis DETECTED (A) NOT DETECTED Final    Comment: Methicillin (oxacillin) resistant coagulase negative staphylococcus. Possible blood culture contaminant (unless isolated from more than one blood culture draw or clinical case suggests pathogenicity). No antibiotic treatment is indicated for blood  culture contaminants. CRITICAL RESULT CALLED TO, READ BACK BY AND VERIFIED WITH: PHARMD AHN PHAM ON 07/15/23 @ 1943 BY DRT    Staphylococcus lugdunensis NOT DETECTED NOT DETECTED Final   Streptococcus species NOT DETECTED NOT DETECTED Final   Streptococcus agalactiae NOT DETECTED NOT DETECTED Final   Streptococcus pneumoniae NOT DETECTED NOT DETECTED Final   Streptococcus pyogenes NOT DETECTED NOT DETECTED Final   A.calcoaceticus-baumannii NOT DETECTED NOT DETECTED Final   Bacteroides fragilis NOT DETECTED NOT DETECTED Final   Enterobacterales NOT DETECTED NOT DETECTED Final   Enterobacter cloacae complex NOT DETECTED NOT DETECTED Final   Escherichia coli NOT DETECTED NOT DETECTED Final   Klebsiella aerogenes NOT DETECTED NOT DETECTED Final   Klebsiella oxytoca NOT DETECTED NOT DETECTED Final   Klebsiella pneumoniae NOT DETECTED NOT DETECTED Final   Proteus species NOT DETECTED NOT DETECTED Final   Salmonella species NOT DETECTED NOT DETECTED Final   Serratia marcescens NOT DETECTED NOT DETECTED Final   Haemophilus influenzae NOT DETECTED NOT DETECTED Final   Neisseria meningitidis NOT DETECTED NOT DETECTED Final   Pseudomonas aeruginosa NOT DETECTED NOT DETECTED Final   Stenotrophomonas maltophilia NOT DETECTED NOT DETECTED Final   Candida albicans NOT  DETECTED NOT DETECTED Final   Candida auris NOT DETECTED NOT DETECTED Final   Candida glabrata NOT DETECTED NOT DETECTED Final   Candida krusei NOT DETECTED NOT DETECTED Final   Candida parapsilosis NOT DETECTED NOT DETECTED Final   Candida tropicalis NOT DETECTED NOT DETECTED Final   Cryptococcus neoformans/gattii NOT DETECTED NOT DETECTED Final  Methicillin resistance mecA/C DETECTED (A) NOT DETECTED Final    Comment: CRITICAL RESULT CALLED TO, READ BACK BY AND VERIFIED WITH: PHARMD AHN PHAM ON 07/15/23 @ 1943 BY DRT Performed at [facility], [address], [place], [place] [address]   Resp panel by RT-PCR (RSV, Flu A&B, Covid) Anterior Nasal Swab     Status: None   Collection Time: 07/14/23  3:54 PM   Specimen: Anterior Nasal Swab  Result Value Ref Range Status   SARS Coronavirus 2 by RT PCR NEGATIVE NEGATIVE Final    Comment: (NOTE) SARS-CoV-2 target nucleic acids are NOT DETECTED.  The SARS-CoV-2 RNA is generally detectable in upper respiratory specimens during the acute phase of infection. The lowest concentration of SARS-CoV-2 viral copies this assay can detect is 138 copies/mL. A negative result does not preclude SARS-Cov-2 infection and should not be used as the sole basis for treatment or other patient management decisions. A negative result may occur with  improper specimen collection/handling, submission of specimen other than nasopharyngeal swab, presence of viral mutation(s) within the areas targeted by this assay, and inadequate number of viral copies(<138 copies/mL). A negative result must be combined with clinical observations, patient history, and epidemiological information. The expected result is Negative.  Fact Sheet for Patients:  BloggerCourse.com  Fact Sheet for Healthcare Providers:  SeriousBroker.it  This test is no t yet approved or cleared by the [place] FDA and  has been authorized for  detection and/or diagnosis of SARS-CoV-2 by FDA under an Emergency Use Authorization (EUA). This EUA will remain  in effect (meaning this test can be used) for the duration of the COVID-19 declaration under Section 564(b)(1) of the Act, 21 U.S.C.section 360bbb-3(b)(1), unless the authorization is terminated  or revoked sooner.       Influenza A by PCR NEGATIVE NEGATIVE Final   Influenza B by PCR NEGATIVE NEGATIVE Final    Comment: (NOTE) The Xpert Xpress SARS-CoV-2/FLU/RSV plus assay is intended as an aid in the diagnosis of influenza from Nasopharyngeal swab specimens and should not be used as a sole basis for treatment. Nasal washings and aspirates are unacceptable for Xpert Xpress SARS-CoV-2/FLU/RSV testing.  Fact Sheet for Patients: BloggerCourse.com  Fact Sheet for Healthcare Providers: SeriousBroker.it  This test is not yet approved or cleared by the [place] FDA and has been authorized for detection and/or diagnosis of SARS-CoV-2 by FDA under an Emergency Use Authorization (EUA). This EUA will remain in effect (meaning this test can be used) for the duration of the COVID-19 declaration under Section 564(b)(1) of the Act, 21 U.S.C. section 360bbb-3(b)(1), unless the authorization is terminated or revoked.     Resp Syncytial Virus by PCR NEGATIVE NEGATIVE Final    Comment: (NOTE) Fact Sheet for Patients: BloggerCourse.com  Fact Sheet for Healthcare Providers: SeriousBroker.it  This test is not yet approved or cleared by the [place] FDA and has been authorized for detection and/or diagnosis of SARS-CoV-2 by FDA under an Emergency Use Authorization (EUA). This EUA will remain in effect (meaning this test can be used) for the duration of the COVID-19 declaration under Section 564(b)(1) of the Act, 21 U.S.C. section 360bbb-3(b)(1), unless the authorization is  terminated or revoked.  Performed at [facility], [address] [place], [place] [address]          Radiology Studies: No results found.      Scheduled Meds:  enoxaparin (LOVENOX) injection  40 mg Subcutaneous Q24H   gabapentin  300  mg Oral TID   mirtazapine  7.5 mg Oral QHS   propranolol  10 mg Oral BID   QUEtiapine  25 mg Oral QHS   QUEtiapine  50 mg Oral BH-q7a   Continuous Infusions:     LOS: 5 days    Time spent: 35 minutes    [name], MD [facility]

## 2023-07-19 NOTE — TOC Progression Note (Addendum)
Transition of Care [facility]) - Progression Note   Patient Details  Name: Tina Fitzpatrick MRN: [id number] Date of Birth: Sep 08, 1951  [facility]) CM/SW Contact  Ewing Schlein, LCSW Phone Number: 07/19/2023, 11:28 AM  Clinical Narrative: CSW updated Ernest Mallick at [facility] that patient may be medically ready for discharge tomorrow and TOC will update the facility if that is the case. FL2 and discharge summary will need to be faxed to [phone] prior to patient discharging back to memory care. Patient will need to be transported by via [facility] as they are contracted with [facility]. Husband updated.  Expected Discharge Plan: Memory Care (with hospice services through [facility]) Barriers to Discharge: Continued Medical Work up, Requiring sitter/restraints  Expected Discharge Plan and Services In-house Referral: Clinical Social Work Post Acute Care Choice: Hospice Living arrangements for the past 2 months: Assisted Living Facility (memory care)           DME Arranged: N/A DME Agency: NA  Social Determinants of Health (SDOH) Interventions SDOH Screenings   Food Insecurity: Patient Unable To Answer (07/15/2023)  Housing: High Risk (07/15/2023)  Transportation Needs: Patient Unable To Answer (07/15/2023)  Utilities: Patient Unable To Answer (07/15/2023)  Social Connections: Unknown (01/26/2022)   Received from [facility]  Tobacco Use: Medium Risk (07/14/2023)   Readmission Risk Interventions     No data to display

## 2023-07-19 NOTE — Progress Notes (Signed)
Patient removed IV, MD made aware and is ok with leaving IV out.

## 2023-07-20 DIAGNOSIS — G9341 Metabolic encephalopathy: Secondary | ICD-10-CM | POA: Diagnosis not present

## 2023-07-20 MED ORDER — CLONAZEPAM 0.5 MG PO TABS: 0.5000 mg | ORAL_TABLET | Freq: Two times a day (BID) | ORAL | 0 refills | Status: AC | PRN

## 2023-07-20 MED ORDER — QUETIAPINE FUMARATE 25 MG PO TABS: 25.0000 mg | ORAL_TABLET | Freq: Two times a day (BID) | ORAL | 0 refills | Status: DC

## 2023-07-20 MED ORDER — ACETAMINOPHEN 650 MG RE SUPP: 650.0000 mg | RECTAL | Status: DC | PRN

## 2023-07-20 MED ORDER — ACETAMINOPHEN 325 MG PO TABS: 650.0000 mg | ORAL_TABLET | Freq: Four times a day (QID) | ORAL | Status: DC | PRN

## 2023-07-20 MED ORDER — MIRTAZAPINE 15 MG PO TBDP: 7.5000 mg | ORAL_TABLET | Freq: Every day | ORAL | 0 refills | Status: DC

## 2023-07-20 NOTE — Consult Note (Addendum)
Brief Psychiatry Consult Note  Pt sound asleep when I saw, and dc to [facility] shortly afterwards. As in full note, responded quite well to thoughtful changes made by Dr. Dartha Lodge; no further changes planned by psych team. If anything would up PM quetiapine if there is a pattern of overnight agitation.   Andelyn Spade A Ranita Stjulien

## 2023-07-20 NOTE — Discharge Summary (Signed)
Physician Discharge Summary  Tina Fitzpatrick ZOX:[id number] DOB: 12/17/1950 DOA: 07/14/2023  PCP: Patient, No Pcp Per  Admit date: 07/14/2023 Discharge date: 07/20/2023  Admitted From: Long-term care Disposition: Long-term care  Recommendations for Outpatient Follow-up:  Follow up with PCP at ALF.  Follow-up with hospice. Home Health: N/A Equipment/Devices: N/A  Discharge Condition: Fair CODE STATUS: DNR Diet recommendation: Regular diet, nutritional supplements  Discharge summary: 72 year old with history of advanced dementia, hypertension, hyperlipidemia and depression and GERD brought from nursing home with altered mental status.  Probably reported falling at nursing home.  Reportedly with EMS blood pressure was 80 and improved with fluids.  Combative in the ER.  Normotensive.  Low-grade fever with temperature 100.2.  Infection workup was negative.  CT scan of the head with no complications.  CT chest abdomen pelvis with no acute findings except focal area of peribronchial infiltration of the right mid lung suspicious of aspiration.  Started on Rocephin and azithromycin and admitted  to the hospital.  Hospitalization complicated with frequent agitation due to her dementia.  Also seen by palliative care and psychiatry.  Treated for following conditions.   Assessment & plan of care   Right middle lobe pneumonia: Likely aspiration pneumonia.  Does not have any pulmonary symptoms.  She received 4 days of antibiotics.  She is pulling out her IV lines.  Discontinue all antibiotics.  Currently stable.   Altered mental status, dementia with behavioral disturbances: Patient on trazodone, Klonopin at home.  Reported intermittent agitation. Continue fall precautions.  Delirium precautions. Start Seroquel 25 in the morning and 25 at night.  Remeron.  Continue gabapentin.  Discontinue donepezil and trazodone as recommended by psychiatry. Will continue Klonopin as needed.  Prescribed. She will  need ongoing follow-up, likely from hospice provider and may need more mood stabilizer.  Currently stable.   Hypertension: Reported in the emergency room.  Currently normotensive.  No indication to treat.  Patient is medically stable to transfer back to long-term care today with hospice follow-up.   Discharge Diagnoses:  Principal Problem:   CAP (community acquired pneumonia) Active Problems:   Goals of care, counseling/discussion   Acute metabolic encephalopathy   Hypotension   Dementia with behavioral disturbance (HCC)   Community acquired pneumonia   Thoracic aortic aneurysm without rupture [facility])   Palliative care by specialist    Discharge Instructions  Discharge Instructions     Diet general   Complete by: As directed    Increase activity slowly   Complete by: As directed       Allergies as of 07/20/2023   No Known Allergies      Medication List     STOP taking these medications    cephALEXin 500 MG capsule Commonly known as: KEFLEX   traZODone 100 MG tablet Commonly known as: DESYREL   traZODone 50 MG tablet Commonly known as: DESYREL       TAKE these medications    acetaminophen 500 MG tablet Commonly known as: TYLENOL Take 500 mg by mouth in the morning, at noon, and at bedtime.   clonazePAM 0.5 MG tablet Commonly known as: KLONOPIN Take 1 tablet (0.5 mg total) by mouth 2 (two) times daily as needed for anxiety (agitation). What changed:  when to take this reasons to take this   gabapentin 400 MG capsule Commonly known as: NEURONTIN Take 400 mg by mouth 3 (three) times daily.   ipratropium-albuterol 0.5-2.5 (3) MG/3ML Soln Commonly known as: DUONEB Take 3 mLs by nebulization every  6 (six) hours as needed (wheezing/SOB).   mirtazapine 15 MG disintegrating tablet Commonly known as: REMERON SOL-TAB Take 0.5 tablets (7.5 mg total) by mouth at bedtime.   neomycin-bacitracin-polymyxin [id number] Oint Apply 1 Application topically See  admin instructions. Apply a pea-sized amount of ointment to wound on scalp once a day after cleansing and drying- until healed   NON FORMULARY Apply 1 application  topically See admin instructions. Zinc paste 17%- Apply to buttocks/perineal area with incontinent care- every shift (per MAR)   Nutritional Shake Liqd Take 2 Bottles by mouth See admin instructions. Drink 2 cartons/bottles by mouth three times a day   Povidone-Iodine Paint Sponge 10 % Swab Apply 1 application  topically See admin instructions. Apply to scalp laceration once a day. Cleanse gently with swab, allow to dry, then apply a pea-sized amount of triple antibiotic ointment to the wound. Apply until healed.   propranolol 10 MG tablet Commonly known as: INDERAL Take 10 mg by mouth See admin instructions. Take 10 mg by mouth two times a day and hold for pulse under 55   QUEtiapine 25 MG tablet Commonly known as: SEROQUEL Take 1 tablet (25 mg total) by mouth 2 (two) times daily.        No Known Allergies  Consultations: Palliative and hospice Psychiatry   Procedures/Studies: CT CHEST ABDOMEN PELVIS W CONTRAST  Result Date: 07/14/2023 CLINICAL DATA:  Sepsis. Altered mental status. Possible fall. Hypotension. EXAM: CT CHEST, ABDOMEN, AND PELVIS WITH CONTRAST TECHNIQUE: Multidetector CT imaging of the chest, abdomen and pelvis was performed following the standard protocol during bolus administration of intravenous contrast. RADIATION DOSE REDUCTION: This exam was performed according to the departmental dose-optimization program which includes automated exposure control, adjustment of the mA and/or kV according to patient size and/or use of iterative reconstruction technique. CONTRAST:  OMNIPAQUE IOHEXOL 300 MG/ML  SOLN COMPARISON:  Chest radiograph 07/14/2023. CT abdomen and pelvis 12/03/2018 FINDINGS: CT CHEST FINDINGS Cardiovascular: Normal heart size. No pericardial effusions. Ascending thoracic aortic aneurysm  measuring 4.1 cm AP diameter. No dissection. Great vessel origins are patent. Calcification of the aorta and coronary arteries. Mediastinum/Nodes: No enlarged mediastinal, hilar, or axillary lymph nodes. Thyroid gland, trachea, and esophagus demonstrate no significant findings. Lungs/Pleura: Mild dependent atelectasis in the lung bases. Focal area of peribronchial infiltration in the right upper lung centrally. This may represent focal pneumonia or possibly limited aspiration. No pleural effusions. No pneumothorax. Musculoskeletal: Degenerative changes in the spine. No focal bone lesions. Postoperative changes in the cervical spine. CT ABDOMEN PELVIS FINDINGS Hepatobiliary: No focal liver lesions. No bile duct dilatation. Gallbladder is not identified and could be contracted or surgically absent. Pancreas: Unremarkable. No pancreatic ductal dilatation or surrounding inflammatory changes. Spleen: Normal in size without focal abnormality. Adrenals/Urinary Tract: Adrenal glands are unremarkable. Kidneys are normal, without renal calculi, focal lesion, or hydronephrosis. Bladder is unremarkable. Stomach/Bowel: Stomach is mildly distended with ingested material. No discrete wall thickening or inflammatory change. Small bowel and colon are not abnormally distended. Stool fills the colon. Appendix is not identified. Vascular/Lymphatic: Aortic atherosclerosis. No enlarged abdominal or pelvic lymph nodes. Reproductive: Uterus and bilateral adnexa are unremarkable. Other: Small amount of free fluid in the abdomen and pelvis, likely mild ascites. No free air. Abdominal wall musculature appears intact. Musculoskeletal: Degenerative changes in the spine. No focal bone lesions. Comment: Examination is technically limited due to nonstandard patient positioning. IMPRESSION: 1. Ascending thoracic aortic aneurysm measuring 4.1 cm AP diameter. Recommend annual imaging followup by CTA or  MRA. This recommendation follows 2010  ACCF/AHA/AATS/ACR/ASA/SCA/SCAI/SIR/STS/SVM Guidelines for the Diagnosis and Management of Patients with Thoracic Aortic Disease. Circulation. 2010; 121: Z610-R604. Aortic aneurysm NOS (ICD10-I71.9) 2. Focal area of peribronchial infiltration in the right mid lung centrally may represent focal bronchopneumonia or aspiration. 3. No acute process demonstrated in the abdomen or pelvis. Stomach appears distended with ingested material but no obstructing lesion is identified. 4. Small amount of free fluid in the abdomen and pelvis likely representing mild ascites. 5. Aortic atherosclerosis. Electronically Signed   By: Burman Nieves M.D.   On: 07/14/2023 20:42   CT Head Wo Contrast  Result Date: 07/14/2023 CLINICAL DATA:  Possible fall, hypotension, febrile, altered level of consciousness EXAM: CT HEAD WITHOUT CONTRAST TECHNIQUE: Contiguous axial images were obtained from the base of the skull through the vertex without intravenous contrast. RADIATION DOSE REDUCTION: This exam was performed according to the departmental dose-optimization program which includes automated exposure control, adjustment of the mA and/or kV according to patient size and/or use of iterative reconstruction technique. COMPARISON:  06/15/2023 FINDINGS: Brain: No evidence of acute infarct or hemorrhage. Lateral ventricles and midline structures are unremarkable. No acute extra-axial fluid collections. No mass effect. Vascular: No hyperdense vessel or unexpected calcification. Skull: Increased posterior scalp hematoma. No underlying fractures. The remainder of the calvarium is unremarkable. Sinuses/Orbits: No acute finding. Other: None. IMPRESSION: 1. Enlarged posterior scalp hematoma.  No underlying fracture. 2. No acute intracranial process. Electronically Signed   By: Sharlet Salina M.D.   On: 07/14/2023 18:13   DG Chest Portable 1 View  Result Date: 07/14/2023 CLINICAL DATA:  Altered level of consciousness EXAM: PORTABLE CHEST 1 VIEW  COMPARISON:  10/09/2019 FINDINGS: Single frontal view of the chest was obtained with the patient rotated toward the right. The cardiac silhouette is unremarkable. No acute airspace disease, effusion, or pneumothorax. Subacute right posterolateral fifth rib fracture with callus formation. No other acute bony abnormalities. IMPRESSION: 1. Healing subacute right posterolateral fifth rib fracture. 2. Otherwise no acute intrathoracic process. Electronically Signed   By: Sharlet Salina M.D.   On: 07/14/2023 18:11   (Echo, Carotid, EGD, Colonoscopy, ERCP)    Subjective: Patient seen and examined.  Today she is pleasant.  Forgetful.  Denies any complaints.  No overnight events noted.  On room air.   Discharge Exam: Vitals:   07/19/23 2300 07/19/23 2307  BP: (!) 155/95 (!) 155/95  Pulse: 89 89  Resp:  16  Temp:    SpO2:     Vitals:   07/19/23 0424 07/19/23 1307 07/19/23 2300 07/19/23 2307  BP: 125/69 (!) 102/55 (!) 155/95 (!) 155/95  Pulse: (!) 56 (!) 59 89 89  Resp: 17 16  16   Temp: 98.4 F (36.9 C) 97.7 F (36.5 C)    TempSrc: Oral     SpO2: 100%     Weight:      Height:        General: Pt is alert, awake, not in acute distress Very frail and debilitated.  Forgetful.  Oriented x 0.  Looks quiet and calm. Cardiovascular: RRR, S1/S2 +, no rubs, no gallops Respiratory: CTA bilaterally, no wheezing, no rhonchi Abdominal: Soft, NT, ND, bowel sounds + Extremities: no edema, no cyanosis    The results of significant diagnostics from this hospitalization (including imaging, microbiology, ancillary and laboratory) are listed below for reference.     Microbiology: Recent Results (from the past 240 hour(s))  Culture, blood (routine x 2)     Status: None  Collection Time: 07/14/23  3:35 PM   Specimen: BLOOD  Result Value Ref Range Status   Specimen Description   Final    BLOOD BLOOD RIGHT FOREARM Performed at [facility], [address], [place], [place]  [address]    Special Requests   Final    BOTTLES DRAWN AEROBIC AND ANAEROBIC Blood Culture adequate volume Performed at [facility], [address], [place], [place] [address]    Culture   Final    NO GROWTH 5 DAYS Performed at [facility], [address], [place], [place] [address]    Report Status 07/19/2023 FINAL  Final  Urine Culture     Status: None   Collection Time: 07/14/23  3:41 PM   Specimen: Urine, Catheterized  Result Value Ref Range Status   Specimen Description   Final    URINE, CATHETERIZED Performed at [facility], [address] [place] [address], [place], [place] [address]    Special Requests   Final    NONE Performed at [facility], [address], [place], [place] [address]    Culture   Final    NO GROWTH Performed at [facility], [address], [place], [place] [address]    Report Status 07/15/2023 FINAL  Final  Culture, blood (routine x 2)     Status: Abnormal   Collection Time: 07/14/23  3:44 PM   Specimen: BLOOD LEFT FOREARM  Result Value Ref Range Status   Specimen Description   Final    BLOOD LEFT FOREARM Performed at [facility], [address], [place], [place] [address]    Special Requests   Final    BOTTLES DRAWN AEROBIC AND ANAEROBIC Blood Culture results may not be optimal due to an excessive volume of blood received in culture bottles Performed at [facility], [address] [place], [place] [address]    Culture  Setup Time   Final    GRAM POSITIVE COCCI IN CLUSTERS AEROBIC BOTTLE ONLY CRITICAL RESULT CALLED TO, READ BACK BY AND VERIFIED WITH: PHARMD AHN PHAM ON 07/15/23 @ 1943 BY DRT    Culture (A)  Final    STAPHYLOCOCCUS EPIDERMIDIS THE SIGNIFICANCE OF ISOLATING THIS ORGANISM FROM A SINGLE SET OF BLOOD CULTURES WHEN MULTIPLE SETS ARE DRAWN IS UNCERTAIN. PLEASE NOTIFY THE MICROBIOLOGY DEPARTMENT WITHIN ONE WEEK IF SPECIATION AND SENSITIVITIES ARE  REQUIRED. Performed at [facility], [address], [place], [place] [address]    Report Status 07/16/2023 FINAL  Final  Blood Culture ID Panel (Reflexed)     Status: Abnormal   Collection Time: 07/14/23  3:44 PM  Result Value Ref Range Status   Enterococcus faecalis NOT DETECTED NOT DETECTED Final   Enterococcus Faecium NOT DETECTED NOT DETECTED Final   Listeria monocytogenes NOT DETECTED NOT DETECTED Final   Staphylococcus species DETECTED (A) NOT DETECTED Final    Comment: CRITICAL RESULT CALLED TO, READ BACK BY AND VERIFIED WITH: PHARMD AHN PHAM ON 07/15/23 @ 1943 BY DRT    Staphylococcus aureus (BCID) NOT DETECTED NOT DETECTED Final   Staphylococcus epidermidis DETECTED (A) NOT DETECTED Final    Comment: Methicillin (oxacillin) resistant coagulase negative staphylococcus. Possible blood culture contaminant (unless isolated from more than one blood culture draw or clinical case suggests pathogenicity). No antibiotic treatment is indicated for blood  culture contaminants. CRITICAL RESULT CALLED TO, READ BACK BY AND VERIFIED WITH: PHARMD AHN PHAM ON 07/15/23 @ 1943 BY  DRT    Staphylococcus lugdunensis NOT DETECTED NOT DETECTED Final   Streptococcus species NOT DETECTED NOT DETECTED Final   Streptococcus agalactiae NOT DETECTED NOT DETECTED Final   Streptococcus pneumoniae NOT DETECTED NOT DETECTED Final   Streptococcus pyogenes NOT DETECTED NOT DETECTED Final   A.calcoaceticus-baumannii NOT DETECTED NOT DETECTED Final   Bacteroides fragilis NOT DETECTED NOT DETECTED Final   Enterobacterales NOT DETECTED NOT DETECTED Final   Enterobacter cloacae complex NOT DETECTED NOT DETECTED Final   Escherichia coli NOT DETECTED NOT DETECTED Final   Klebsiella aerogenes NOT DETECTED NOT DETECTED Final   Klebsiella oxytoca NOT DETECTED NOT DETECTED Final   Klebsiella pneumoniae NOT DETECTED NOT DETECTED Final   Proteus species NOT DETECTED NOT DETECTED Final   Salmonella species NOT  DETECTED NOT DETECTED Final   Serratia marcescens NOT DETECTED NOT DETECTED Final   Haemophilus influenzae NOT DETECTED NOT DETECTED Final   Neisseria meningitidis NOT DETECTED NOT DETECTED Final   Pseudomonas aeruginosa NOT DETECTED NOT DETECTED Final   Stenotrophomonas maltophilia NOT DETECTED NOT DETECTED Final   Candida albicans NOT DETECTED NOT DETECTED Final   Candida auris NOT DETECTED NOT DETECTED Final   Candida glabrata NOT DETECTED NOT DETECTED Final   Candida krusei NOT DETECTED NOT DETECTED Final   Candida parapsilosis NOT DETECTED NOT DETECTED Final   Candida tropicalis NOT DETECTED NOT DETECTED Final   Cryptococcus neoformans/gattii NOT DETECTED NOT DETECTED Final   Methicillin resistance mecA/C DETECTED (A) NOT DETECTED Final    Comment: CRITICAL RESULT CALLED TO, READ BACK BY AND VERIFIED WITH: PHARMD AHN PHAM ON 07/15/23 @ 1943 BY DRT Performed at [facility], [address] [place], [place] [address]   Resp panel by RT-PCR (RSV, Flu A&B, Covid) Anterior Nasal Swab     Status: None   Collection Time: 07/14/23  3:54 PM   Specimen: Anterior Nasal Swab  Result Value Ref Range Status   SARS Coronavirus 2 by RT PCR NEGATIVE NEGATIVE Final    Comment: (NOTE) SARS-CoV-2 target nucleic acids are NOT DETECTED.  The SARS-CoV-2 RNA is generally detectable in upper respiratory specimens during the acute phase of infection. The lowest concentration of SARS-CoV-2 viral copies this assay can detect is 138 copies/mL. A negative result does not preclude SARS-Cov-2 infection and should not be used as the sole basis for treatment or other patient management decisions. A negative result may occur with  improper specimen collection/handling, submission of specimen other than nasopharyngeal swab, presence of viral mutation(s) within the areas targeted by this assay, and inadequate number of viral copies(<138 copies/mL). A negative result must be combined with clinical  observations, patient history, and epidemiological information. The expected result is Negative.  Fact Sheet for Patients:  BloggerCourse.com  Fact Sheet for Healthcare Providers:  SeriousBroker.it  This test is no t yet approved or cleared by the [place] FDA and  has been authorized for detection and/or diagnosis of SARS-CoV-2 by FDA under an Emergency Use Authorization (EUA). This EUA will remain  in effect (meaning this test can be used) for the duration of the COVID-19 declaration under Section 564(b)(1) of the Act, 21 U.S.C.section 360bbb-3(b)(1), unless the authorization is terminated  or revoked sooner.       Influenza A by PCR NEGATIVE NEGATIVE Final   Influenza B by PCR NEGATIVE NEGATIVE Final    Comment: (NOTE) The Xpert Xpress SARS-CoV-2/FLU/RSV plus assay is intended as an aid in the diagnosis of influenza from Nasopharyngeal swab specimens and should not  be used as a sole basis for treatment. Nasal washings and aspirates are unacceptable for Xpert Xpress SARS-CoV-2/FLU/RSV testing.  Fact Sheet for Patients: BloggerCourse.com  Fact Sheet for Healthcare Providers: SeriousBroker.it  This test is not yet approved or cleared by the [place] FDA and has been authorized for detection and/or diagnosis of SARS-CoV-2 by FDA under an Emergency Use Authorization (EUA). This EUA will remain in effect (meaning this test can be used) for the duration of the COVID-19 declaration under Section 564(b)(1) of the Act, 21 U.S.C. section 360bbb-3(b)(1), unless the authorization is terminated or revoked.     Resp Syncytial Virus by PCR NEGATIVE NEGATIVE Final    Comment: (NOTE) Fact Sheet for Patients: BloggerCourse.com  Fact Sheet for Healthcare Providers: SeriousBroker.it  This test is not yet approved or cleared by  the [place] FDA and has been authorized for detection and/or diagnosis of SARS-CoV-2 by FDA under an Emergency Use Authorization (EUA). This EUA will remain in effect (meaning this test can be used) for the duration of the COVID-19 declaration under Section 564(b)(1) of the Act, 21 U.S.C. section 360bbb-3(b)(1), unless the authorization is terminated or revoked.  Performed at [facility], [address], [place], [place] [address]      Labs: BNP (last 3 results) No results for input(s): "BNP" in the last 8760 hours. Basic Metabolic Panel: Recent Labs  Lab 07/14/23 1535 07/15/23 0350 07/19/23 0847  NA 136 136 139  K 4.3 4.1 3.8  CL 103 107 104  CO2 24 23 25   GLUCOSE 118* 91 95  BUN 18 13 14   CREATININE 0.50 0.40* 0.60  CALCIUM 9.1 8.6* 9.1  MG  --   --  2.1  PHOS  --   --  3.3   Liver Function Tests: Recent Labs  Lab 07/14/23 1535 07/19/23 0847  AST 20  --   ALT 18  --   ALKPHOS 59  --   BILITOT 0.7  --   PROT 6.5  --   ALBUMIN 3.7 3.4*   No results for input(s): "LIPASE", "AMYLASE" in the last 168 hours. No results for input(s): "AMMONIA" in the last 168 hours. CBC: Recent Labs  Lab 07/14/23 1535 07/15/23 0350 07/19/23 0847  WBC 10.5 6.2 4.3  NEUTROABS 8.7*  --  2.4  HGB 11.3* 10.8* 11.4*  HCT 33.5* 34.1* 35.4*  MCV 93.6 99.7 96.5  PLT 253 153 178   Cardiac Enzymes: No results for input(s): "CKTOTAL", "CKMB", "CKMBINDEX", "TROPONINI" in the last 168 hours. BNP: Invalid input(s): "POCBNP" CBG: Recent Labs  Lab 07/14/23 1542 07/18/23 1913  GLUCAP 77 119*   D-Dimer No results for input(s): "DDIMER" in the last 72 hours. Hgb A1c No results for input(s): "HGBA1C" in the last 72 hours. Lipid Profile No results for input(s): "CHOL", "HDL", "LDLCALC", "TRIG", "CHOLHDL", "LDLDIRECT" in the last 72 hours. Thyroid function studies No results for input(s): "TSH", "T4TOTAL", "T3FREE", "THYROIDAB" in the last 72  hours.  Invalid input(s): "FREET3" Anemia work up No results for input(s): "VITAMINB12", "FOLATE", "FERRITIN", "TIBC", "IRON", "RETICCTPCT" in the last 72 hours. Urinalysis    Component Value Date/Time   COLORURINE YELLOW 07/14/2023 1540   APPEARANCEUR CLEAR 07/14/2023 1540   LABSPEC 1.015 07/14/2023 1540   PHURINE 6.0 07/14/2023 1540   GLUCOSEU NEGATIVE 07/14/2023 1540   HGBUR NEGATIVE 07/14/2023 1540   BILIRUBINUR NEGATIVE 07/14/2023 1540   BILIRUBINUR negative 11/10/2018 1107   BILIRUBINUR neg 01/06/2018 1211   KETONESUR NEGATIVE 07/14/2023 1540  PROTEINUR NEGATIVE 07/14/2023 1540   UROBILINOGEN 0.2 11/10/2018 1107   UROBILINOGEN 0.2 04/10/2015 1145   NITRITE NEGATIVE 07/14/2023 1540   LEUKOCYTESUR NEGATIVE 07/14/2023 1540   Sepsis Labs Recent Labs  Lab 07/14/23 1535 07/15/23 0350 07/19/23 0847  WBC 10.5 6.2 4.3   Microbiology Recent Results (from the past 240 hour(s))  Culture, blood (routine x 2)     Status: None   Collection Time: 07/14/23  3:35 PM   Specimen: BLOOD  Result Value Ref Range Status   Specimen Description   Final    BLOOD BLOOD RIGHT FOREARM Performed at [facility], [address], [place], [place] [address]    Special Requests   Final    BOTTLES DRAWN AEROBIC AND ANAEROBIC Blood Culture adequate volume Performed at [facility], [address], [place], [place] [address]    Culture   Final    NO GROWTH 5 DAYS Performed at [facility], [address], [place], [place] [address]    Report Status 07/19/2023 FINAL  Final  Urine Culture     Status: None   Collection Time: 07/14/23  3:41 PM   Specimen: Urine, Catheterized  Result Value Ref Range Status   Specimen Description   Final    URINE, CATHETERIZED Performed at [facility], [address], [place], [place] [address]    Special Requests   Final    NONE Performed at [facility], [address],  [place], [place] [address]    Culture   Final    NO GROWTH Performed at [facility], [address] [place], [place] [address]    Report Status 07/15/2023 FINAL  Final  Culture, blood (routine x 2)     Status: Abnormal   Collection Time: 07/14/23  3:44 PM   Specimen: BLOOD LEFT FOREARM  Result Value Ref Range Status   Specimen Description   Final    BLOOD LEFT FOREARM Performed at [facility], [address], [place], [place] [address]    Special Requests   Final    BOTTLES DRAWN AEROBIC AND ANAEROBIC Blood Culture results may not be optimal due to an excessive volume of blood received in culture bottles Performed at [facility], [address], [place], [place] [address]    Culture  Setup Time   Final    GRAM POSITIVE COCCI IN CLUSTERS AEROBIC BOTTLE ONLY CRITICAL RESULT CALLED TO, READ BACK BY AND VERIFIED WITH: PHARMD AHN PHAM ON 07/15/23 @ 1943 BY DRT    Culture (A)  Final    STAPHYLOCOCCUS EPIDERMIDIS THE SIGNIFICANCE OF ISOLATING THIS ORGANISM FROM A SINGLE SET OF BLOOD CULTURES WHEN MULTIPLE SETS ARE DRAWN IS UNCERTAIN. PLEASE NOTIFY THE MICROBIOLOGY DEPARTMENT WITHIN ONE WEEK IF SPECIATION AND SENSITIVITIES ARE REQUIRED. Performed at [facility], [address], [place], [place] [address]    Report Status 07/16/2023 FINAL  Final  Blood Culture ID Panel (Reflexed)     Status: Abnormal   Collection Time: 07/14/23  3:44 PM  Result Value Ref Range Status   Enterococcus faecalis NOT DETECTED NOT DETECTED Final   Enterococcus Faecium NOT DETECTED NOT DETECTED Final   Listeria monocytogenes NOT DETECTED NOT DETECTED Final   Staphylococcus species DETECTED (A) NOT DETECTED Final    Comment: CRITICAL RESULT CALLED TO, READ BACK BY AND VERIFIED WITH: PHARMD AHN PHAM ON 07/15/23 @ 1943 BY DRT    Staphylococcus aureus (BCID)  NOT DETECTED NOT DETECTED Final   Staphylococcus epidermidis DETECTED (A) NOT DETECTED Final    Comment: Methicillin  (oxacillin) resistant coagulase negative staphylococcus. Possible blood culture contaminant (unless isolated from more than one blood culture draw or clinical case suggests pathogenicity). No antibiotic treatment is indicated for blood  culture contaminants. CRITICAL RESULT CALLED TO, READ BACK BY AND VERIFIED WITH: PHARMD AHN PHAM ON 07/15/23 @ 1943 BY DRT    Staphylococcus lugdunensis NOT DETECTED NOT DETECTED Final   Streptococcus species NOT DETECTED NOT DETECTED Final   Streptococcus agalactiae NOT DETECTED NOT DETECTED Final   Streptococcus pneumoniae NOT DETECTED NOT DETECTED Final   Streptococcus pyogenes NOT DETECTED NOT DETECTED Final   A.calcoaceticus-baumannii NOT DETECTED NOT DETECTED Final   Bacteroides fragilis NOT DETECTED NOT DETECTED Final   Enterobacterales NOT DETECTED NOT DETECTED Final   Enterobacter cloacae complex NOT DETECTED NOT DETECTED Final   Escherichia coli NOT DETECTED NOT DETECTED Final   Klebsiella aerogenes NOT DETECTED NOT DETECTED Final   Klebsiella oxytoca NOT DETECTED NOT DETECTED Final   Klebsiella pneumoniae NOT DETECTED NOT DETECTED Final   Proteus species NOT DETECTED NOT DETECTED Final   Salmonella species NOT DETECTED NOT DETECTED Final   Serratia marcescens NOT DETECTED NOT DETECTED Final   Haemophilus influenzae NOT DETECTED NOT DETECTED Final   Neisseria meningitidis NOT DETECTED NOT DETECTED Final   Pseudomonas aeruginosa NOT DETECTED NOT DETECTED Final   Stenotrophomonas maltophilia NOT DETECTED NOT DETECTED Final   Candida albicans NOT DETECTED NOT DETECTED Final   Candida auris NOT DETECTED NOT DETECTED Final   Candida glabrata NOT DETECTED NOT DETECTED Final   Candida krusei NOT DETECTED NOT DETECTED Final   Candida parapsilosis NOT DETECTED NOT DETECTED Final   Candida tropicalis NOT DETECTED NOT DETECTED Final   Cryptococcus neoformans/gattii NOT DETECTED NOT DETECTED Final   Methicillin resistance mecA/C DETECTED (A) NOT DETECTED  Final    Comment: CRITICAL RESULT CALLED TO, READ BACK BY AND VERIFIED WITH: PHARMD AHN PHAM ON 07/15/23 @ 1943 BY DRT Performed at [facility], [address] [place], [place] [address]   Resp panel by RT-PCR (RSV, Flu A&B, Covid) Anterior Nasal Swab     Status: None   Collection Time: 07/14/23  3:54 PM   Specimen: Anterior Nasal Swab  Result Value Ref Range Status   SARS Coronavirus 2 by RT PCR NEGATIVE NEGATIVE Final    Comment: (NOTE) SARS-CoV-2 target nucleic acids are NOT DETECTED.  The SARS-CoV-2 RNA is generally detectable in upper respiratory specimens during the acute phase of infection. The lowest concentration of SARS-CoV-2 viral copies this assay can detect is 138 copies/mL. A negative result does not preclude SARS-Cov-2 infection and should not be used as the sole basis for treatment or other patient management decisions. A negative result may occur with  improper specimen collection/handling, submission of specimen other than nasopharyngeal swab, presence of viral mutation(s) within the areas targeted by this assay, and inadequate number of viral copies(<138 copies/mL). A negative result must be combined with clinical observations, patient history, and epidemiological information. The expected result is Negative.  Fact Sheet for Patients:  BloggerCourse.com  Fact Sheet for Healthcare Providers:  SeriousBroker.it  This test is no t yet approved or cleared by the [place] FDA and  has been authorized for detection and/or diagnosis of SARS-CoV-2 by FDA under an Emergency Use Authorization (EUA). This EUA will remain  in effect (meaning this test can be used) for the duration of the  COVID-19 declaration under Section 564(b)(1) of the Act, 21 U.S.C.section 360bbb-3(b)(1), unless the authorization is terminated  or revoked sooner.       Influenza A by PCR NEGATIVE NEGATIVE Final   Influenza B by PCR  NEGATIVE NEGATIVE Final    Comment: (NOTE) The Xpert Xpress SARS-CoV-2/FLU/RSV plus assay is intended as an aid in the diagnosis of influenza from Nasopharyngeal swab specimens and should not be used as a sole basis for treatment. Nasal washings and aspirates are unacceptable for Xpert Xpress SARS-CoV-2/FLU/RSV testing.  Fact Sheet for Patients: BloggerCourse.com  Fact Sheet for Healthcare Providers: SeriousBroker.it  This test is not yet approved or cleared by the [place] FDA and has been authorized for detection and/or diagnosis of SARS-CoV-2 by FDA under an Emergency Use Authorization (EUA). This EUA will remain in effect (meaning this test can be used) for the duration of the COVID-19 declaration under Section 564(b)(1) of the Act, 21 U.S.C. section 360bbb-3(b)(1), unless the authorization is terminated or revoked.     Resp Syncytial Virus by PCR NEGATIVE NEGATIVE Final    Comment: (NOTE) Fact Sheet for Patients: BloggerCourse.com  Fact Sheet for Healthcare Providers: SeriousBroker.it  This test is not yet approved or cleared by the [place] FDA and has been authorized for detection and/or diagnosis of SARS-CoV-2 by FDA under an Emergency Use Authorization (EUA). This EUA will remain in effect (meaning this test can be used) for the duration of the COVID-19 declaration under Section 564(b)(1) of the Act, 21 U.S.C. section 360bbb-3(b)(1), unless the authorization is terminated or revoked.  Performed at [facility], [address], [place], [place] [address]      Time coordinating discharge: 35 minutes  SIGNED:   [name], MD  [facility] 07/20/2023, 11:13 AM

## 2023-07-20 NOTE — TOC Transition Note (Signed)
Transition of Care [facility]) - CM/SW Discharge Note  Patient Details  Name: Tina Fitzpatrick MRN: [id number] Date of Birth: Oct 03, 1950  Transition of Care [facility]) CM/SW Contact:  Ewing Schlein, LCSW Phone Number: 07/20/2023, 2:15 PM  Clinical Narrative: Patient is medically stable to return to [facility] with hospice through [facility]. CSW faxed FL2 and discharge summary to [employer] and confirmed the patient can return. Medical necessity form done; [facility] scheduled. Discharge packet completed. RN updated. CSW notified spouse of discharge and transportation being set up. CSW updated Misty with [facility]. TOC signing off.  Final next level of care: Memory Care Barriers to Discharge: Barriers Resolved  Patient Goals and CMS Choice CMS Medicare.gov Compare Post Acute Care list provided to:: Patient Represenative (must comment) Choice offered to / list presented to : Spouse  Discharge Placement     Patient chooses bed at: [facility] Patient to be transferred to facility by: [facility] Name of family member notified: Mayley Lish (spouse) Patient and family notified of of transfer: 07/20/23  Discharge Plan and Services Additional resources added to the After Visit Summary for   In-house Referral: Clinical Social Work Post Acute Care Choice: Hospice          DME Arranged: N/A DME Agency: NA  Social Determinants of Health (SDOH) Interventions SDOH Screenings   Food Insecurity: Patient Unable To Answer (07/15/2023)  Housing: High Risk (07/15/2023)  Transportation Needs: Patient Unable To Answer (07/15/2023)  Utilities: Patient Unable To Answer (07/15/2023)  Social Connections: Unknown (01/26/2022)   Received from [facility]  Tobacco Use: Medium Risk (07/14/2023)   Readmission Risk Interventions     No data to display

## 2023-07-20 NOTE — Plan of Care (Signed)
Patient discharging to [facility] via ambulance. Awaiting ambulance transport. Attempted to call report to facility staff without answer. AVS paperwork placed in discharge packet. Haydee Salter, RN 07/20/23 3:13 PM

## 2023-07-20 NOTE — [place] FL2 (Addendum)
[place] MEDICAID FL2 LEVEL OF CARE FORM     IDENTIFICATION  Patient Name: Tina Fitzpatrick Birthdate: 1951-07-11 Sex: female Admission Date (Current Location): 07/14/2023  [place] and [place] Number:  [facility] Bast [id number] S Facility and Address:  [facility],  [address] [place], [place] [address]      Provider Number: [id number]  Attending Physician Name and Address:  [name], MD  Relative Name and Phone Number:  Janasha Barkalow (spouse) Ph: [phone]    Current Level of Care: Hospital Recommended Level of Care: Memory Care [facility]) Prior Approval Number:    Date Approved/Denied:   PASRR Number:    Discharge Plan: Other (Comment) [facility] memory care)    Current Diagnoses: Patient Active Problem List   Diagnosis Date Noted   Palliative care by specialist 07/19/2023   Dementia with behavioral disturbance (HCC) 07/14/2023   CAP (community acquired pneumonia) 07/14/2023   Community acquired pneumonia 07/14/2023   Thoracic aortic aneurysm without rupture (HCC) 07/14/2023   Normochromic normocytic anemia 09/09/2017   Acute encephalopathy 09/08/2017   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 10/31/2015   Hypotension 10/30/2015   Mild dementia (HCC) 07/29/2015   Acute metabolic encephalopathy 06/24/2015   Syncope 06/22/2013   Goals of care, counseling/discussion 02/10/2012   GERD (gastroesophageal reflux disease)    Essential hypertension    Hyperlipidemia    DDD (degenerative disc disease), cervical     Orientation RESPIRATION BLADDER Height & Weight     Self  Normal Incontinent Weight: 130 lb 1.1 oz (59 kg) Height:  5\' 6"  (167.6 cm)  BEHAVIORAL SYMPTOMS/MOOD NEUROLOGICAL BOWEL NUTRITION STATUS  Physically abusive (Aggressive with staff due to dementia.)   Continent Mechanical soft  AMBULATORY STATUS COMMUNICATION OF NEEDS Skin   Extensive Assist Verbally Other (Comment), Skin abrasions (Abrasion: left arm;  Eythema: left foot, right hand)                       Personal Care Assistance Level of Assistance  Bathing, Feeding, Dressing Bathing Assistance: Maximum assistance Feeding assistance: Limited assistance Dressing Assistance: Maximum assistance     Functional Limitations Info  Sight, Hearing, Speech Sight Info: Adequate Hearing Info: Adequate Speech Info: Adequate    SPECIAL CARE FACTORS FREQUENCY                       Contractures Contractures Info: Not present    Additional Factors Info  Code Status, Allergies, Psychotropic Code Status Info: DNR Allergies Info: NKA Psychotropic Info: See discharge summary         Current Medications (07/20/2023):  This is the current hospital active medication list Current Facility-Administered Medications  Medication Dose Route Frequency Provider Last Rate Last Admin   acetaminophen (TYLENOL) suppository 650 mg  650 mg Rectal Q4H PRN Tu, Ching T, DO       acetaminophen (TYLENOL) tablet 650 mg  650 mg Oral Q6H PRN Tu, Ching T, DO   650 mg at 07/20/23 1115   clonazePAM (KLONOPIN) tablet 0.5 mg  0.5 mg Oral Daily PRN [name], MD   0.5 mg at 07/19/23 1939   enoxaparin (LOVENOX) injection 40 mg  40 mg Subcutaneous Q24H Tu, Ching T, DO   40 mg at 07/19/23 2300   gabapentin (NEURONTIN) capsule 300 mg  300 mg Oral TID Berton Mount I, MD   300 mg at 07/20/23 1116   haloperidol lactate (HALDOL) injection 2 mg  2 mg Intravenous Q6H  PRN [name], MD   2 mg at 07/19/23 2045   mirtazapine (REMERON SOL-TAB) disintegrating tablet 7.5 mg  7.5 mg Oral QHS Rosalin Hawking, MD   7.5 mg at 07/19/23 2300   propranolol (INDERAL) tablet 10 mg  10 mg Oral BID [name], MD   10 mg at 07/20/23 1115   QUEtiapine (SEROQUEL) tablet 25 mg  25 mg Oral QHS [name], MD   25 mg at 07/19/23 2300   QUEtiapine (SEROQUEL) tablet 25 mg  25 mg Oral Daily Cinderella, Margaret A   25 mg at 07/20/23 1116     Discharge Medications: Please  see discharge summary for a list of discharge medications.  TAKE these medications     acetaminophen 500 MG tablet Commonly known as: TYLENOL Take 500 mg by mouth in the morning, at noon, and at bedtime.    clonazePAM 0.5 MG tablet Commonly known as: KLONOPIN Take 1 tablet (0.5 mg total) by mouth 2 (two) times daily as needed for anxiety (agitation). What changed:  when to take this reasons to take this    gabapentin 400 MG capsule Commonly known as: NEURONTIN Take 400 mg by mouth 3 (three) times daily.    ipratropium-albuterol 0.5-2.5 (3) MG/3ML Soln Commonly known as: DUONEB Take 3 mLs by nebulization every 6 (six) hours as needed (wheezing/SOB).    mirtazapine 15 MG disintegrating tablet Commonly known as: REMERON SOL-TAB Take 0.5 tablets (7.5 mg total) by mouth at bedtime.    neomycin-bacitracin-polymyxin [phone] Oint Apply 1 Application topically See admin instructions. Apply a pea-sized amount of ointment to wound on scalp once a day after cleansing and drying- until healed    NON FORMULARY Apply 1 application  topically See admin instructions. Zinc paste 17%- Apply to buttocks/perineal area with incontinent care- every shift (per MAR)    Nutritional Shake Liqd Take 2 Bottles by mouth See admin instructions. Drink 2 cartons/bottles by mouth three times a day    Povidone-Iodine Paint Sponge 10 % Swab Apply 1 application  topically See admin instructions. Apply to scalp laceration once a day. Cleanse gently with swab, allow to dry, then apply a pea-sized amount of triple antibiotic ointment to the wound. Apply until healed.    propranolol 10 MG tablet Commonly known as: INDERAL Take 10 mg by mouth See admin instructions. Take 10 mg by mouth two times a day and hold for pulse under 55    QUEtiapine 25 MG tablet Commonly known as: SEROQUEL Take 1 tablet (25 mg total) by mouth 2 (two) times daily.    Relevant Imaging Results:  Relevant Lab  Results:   Additional Information SSN: [id number]  Ewing Schlein, LCSW

## 2023-09-01 ENCOUNTER — Emergency Department (HOSPITAL_COMMUNITY): Payer: Medicare Other

## 2023-09-01 ENCOUNTER — Emergency Department (HOSPITAL_COMMUNITY)
Admission: EM | Admit: 2023-09-01 | Discharge: 2023-09-01 | Disposition: A | Discharge status: Home / Self Care | Payer: Medicare Other | Attending: Emergency Medicine | Admitting: Emergency Medicine

## 2023-09-01 DIAGNOSIS — W010XXA Fall on same level from slipping, tripping and stumbling without subsequent striking against object, initial encounter: Secondary | ICD-10-CM | POA: Insufficient documentation

## 2023-09-01 DIAGNOSIS — I10 Essential (primary) hypertension: Secondary | ICD-10-CM | POA: Diagnosis not present

## 2023-09-01 DIAGNOSIS — F0390 Unspecified dementia without behavioral disturbance: Secondary | ICD-10-CM | POA: Insufficient documentation

## 2023-09-01 DIAGNOSIS — S0003XA Contusion of scalp, initial encounter: Secondary | ICD-10-CM | POA: Insufficient documentation

## 2023-09-01 DIAGNOSIS — W19XXXA Unspecified fall, initial encounter: Secondary | ICD-10-CM

## 2023-09-01 DIAGNOSIS — S0990XA Unspecified injury of head, initial encounter: Secondary | ICD-10-CM

## 2023-09-01 DIAGNOSIS — R451 Restlessness and agitation: Secondary | ICD-10-CM | POA: Diagnosis not present

## 2023-09-01 DIAGNOSIS — Z87891 Personal history of nicotine dependence: Secondary | ICD-10-CM | POA: Diagnosis not present

## 2023-09-01 LAB — CBC WITH DIFFERENTIAL/PLATELET
Abs Immature Granulocytes: 0.02 10*3/uL (ref 0.00–0.07)
Basophils Absolute: 0 10*3/uL (ref 0.0–0.1)
Basophils Relative: 1 %
Eosinophils Absolute: 0.1 10*3/uL (ref 0.0–0.5)
Eosinophils Relative: 1 %
HCT: 31.5 % — ABNORMAL LOW (ref 36.0–46.0)
Hemoglobin: 10.3 g/dL — ABNORMAL LOW (ref 12.0–15.0)
Immature Granulocytes: 0 %
Lymphocytes Relative: 28 %
Lymphs Abs: 1.8 10*3/uL (ref 0.7–4.0)
MCH: 30.5 pg (ref 26.0–34.0)
MCHC: 32.7 g/dL (ref 30.0–36.0)
MCV: 93.2 fL (ref 80.0–100.0)
Monocytes Absolute: 0.5 10*3/uL (ref 0.1–1.0)
Monocytes Relative: 7 %
Neutro Abs: 3.9 10*3/uL (ref 1.7–7.7)
Neutrophils Relative %: 63 %
Platelets: 212 10*3/uL (ref 150–400)
RBC: 3.38 MIL/uL — ABNORMAL LOW (ref 3.87–5.11)
RDW: 14.1 % (ref 11.5–15.5)
WBC: 6.2 10*3/uL (ref 4.0–10.5)
nRBC: 0 % (ref 0.0–0.2)

## 2023-09-01 LAB — BASIC METABOLIC PANEL
Anion gap: 7 (ref 5–15)
BUN: 11 mg/dL (ref 8–23)
CO2: 27 mmol/L (ref 22–32)
Calcium: 9.3 mg/dL (ref 8.9–10.3)
Chloride: 107 mmol/L (ref 98–111)
Creatinine, Ser: 0.67 mg/dL (ref 0.44–1.00)
GFR, Estimated: >60 mL/min (ref >60)
Glucose, Bld: 101 mg/dL — ABNORMAL HIGH (ref 70–99)
Potassium: 4.2 mmol/L (ref 3.5–5.1)
Sodium: 141 mmol/L (ref 135–145)

## 2023-09-01 MED ORDER — OLANZAPINE 10 MG IM SOLR
10.0000 mg | Freq: Once | INTRAMUSCULAR | Status: AC
  Administered 2023-09-01: 10 mg via INTRAMUSCULAR
  Filled 2023-09-01 (×2): qty 10

## 2023-09-01 MED ORDER — STERILE WATER FOR INJECTION IJ SOLN
INTRAMUSCULAR | Status: AC
  Filled 2023-09-01: qty 10

## 2023-09-01 MED ORDER — MIDAZOLAM HCL 2 MG/2ML IJ SOLN
2.0000 mg | Freq: Once | INTRAMUSCULAR | Status: AC
  Administered 2023-09-01: 2 mg via INTRAMUSCULAR
  Filled 2023-09-01: qty 2

## 2023-09-01 NOTE — ED Notes (Signed)
Patient is climbing out of bed and taking off her safety mitts. RN is aware.

## 2023-09-01 NOTE — ED Notes (Signed)
Attempts x 2 to call [facility]  no answer x 2

## 2023-09-01 NOTE — ED Provider Notes (Addendum)
[place] EMERGENCY DEPARTMENT AT [facility] Provider Note  CSN: [id number] Arrival date & time: 09/01/23 1728  Chief Complaint(s) No chief complaint on file.  HPI Tina Fitzpatrick is a 72 y.o. female here today after witnessed fall from skilled nursing facility.  Patient has advanced dementia, is in hospice care.  Patient reportedly tripped and fell.  History obtained via EMS.  Upon arrival to the emergency department, patient already received 5 mg of IM Haldol due to agitation.  Patient was admitted in November of this year after a fall, found to have an aspiration pneumonia.   Past Medical History Past Medical History:  Diagnosis Date   DDD (degenerative disc disease), cervical    Dementia (HCC)    Depression    GERD (gastroesophageal reflux disease)    Hyperlipidemia    Hypertension    Kidney stones    Patient Active Problem List   Diagnosis Date Noted   Palliative care by specialist 07/19/2023   Dementia with behavioral disturbance (HCC) 07/14/2023   CAP (community acquired pneumonia) 07/14/2023   Community acquired pneumonia 07/14/2023   Thoracic aortic aneurysm without rupture (HCC) 07/14/2023   Normochromic normocytic anemia 09/09/2017   Acute encephalopathy 09/08/2017   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 Authorizing Provider  acetaminophen (TYLENOL) 500 MG tablet Take 500 mg by mouth in the morning, at noon, and at bedtime.    [provider]  clonazePAM (KLONOPIN) 0.5 MG tablet Take 1 tablet (0.5 mg total) by  mouth 2 (two) times daily as needed for anxiety (agitation). 07/20/23   [name], MD  gabapentin (NEURONTIN) 400 MG capsule Take 400 mg by mouth 3 (three) times daily.    [provider]  ipratropium-albuterol (DUONEB) 0.5-2.5 (3) MG/3ML SOLN Take 3 mLs by nebulization every 6 (six) hours as needed (wheezing/SOB).    [provider]  mirtazapine (REMERON SOL-TAB) 15 MG disintegrating tablet Take 0.5 tablets (7.5 mg total) by mouth at bedtime. 07/20/23 08/19/23  [name], MD  neomycin-bacitracin-polymyxin [phone] OINT Apply 1 Application topically See admin instructions. Apply a pea-sized amount of ointment to wound on scalp once a day after cleansing and drying- until healed    [provider]  NON FORMULARY Apply 1 application  topically See admin instructions. Zinc paste 17%- Apply to buttocks/perineal area with incontinent care- every shift (per [facility])    [provider]  Nutritional Supplements (NUTRITIONAL SHAKE) LIQD Take 2 Bottles by mouth See admin instructions. Drink 2 cartons/bottles by mouth three times a day    [provider]  Povidone-Iodine Paint Sponge 10 % SWAB Apply 1 application  topically See admin instructions. Apply to scalp laceration once a day. Cleanse gently with swab, allow to dry, then apply a pea-sized amount of triple antibiotic ointment to the wound. Apply until healed.    [provider]  propranolol (INDERAL) 10 MG tablet Take 10 mg by mouth See admin instructions. Take 10 mg by mouth two times a day and hold for pulse under 55    [provider]  QUEtiapine (SEROQUEL) 25  MG tablet Take 1 tablet (25 mg total) by mouth 2 (two) times daily. 07/20/23 08/19/23  [name], MD                                                                                                                                    Past Surgical History Past Surgical History:  Procedure Laterality Date   kidney stones  1988    NECK SURGERY  2008   Plate/Screws   Family History Family History  Problem Relation Age of Onset   Heart attack Mother    Colon cancer Neg Hx    Esophageal cancer Neg Hx     Social History Social History   Tobacco Use   Smoking status: Former    Types: Cigarettes   Smokeless tobacco: Never  Vaping Use   Vaping status: Never Used  Substance Use Topics   Alcohol use: No    Alcohol/week: 0.0 standard drinks of alcohol   Drug use: No   Allergies Patient has no known allergies.  Review of Systems Review of Systems  Physical Exam Vital Signs  I have reviewed the triage vital signs There were no vitals taken for this visit.  Physical Exam Vitals reviewed.  HENT:     Head: Normocephalic.     Comments: Contusion to the right parietal occipital lobe without laceration    Nose: Nose normal.     Mouth/Throat:     Mouth: Mucous membranes are moist.  Cardiovascular:     Rate and Rhythm: Normal rate.  Pulmonary:     Effort: Pulmonary effort is normal.  Abdominal:     General: Abdomen is flat.     Palpations: Abdomen is soft.  Musculoskeletal:        General: No swelling or tenderness.     Cervical back: Normal range of motion. No tenderness.  Lymphadenopathy:     Cervical: No cervical adenopathy.  Skin:    General: Skin is warm and dry.     Findings: No bruising or lesion.  Neurological:     Mental Status: She is alert. Mental status is at baseline.     ED Results and Treatments Labs (all labs ordered are listed, but only abnormal results are displayed) Labs Reviewed  CBC WITH DIFFERENTIAL/PLATELET  BASIC METABOLIC PANEL  Radiology No results found.  Pertinent labs & imaging results that were available during my care of the patient were reviewed by me and considered in my medical decision making (see MDM for details).  Medications  Ordered in ED Medications  sterile water (preservative free) injection (has no administration in time range)  OLANZapine (ZYPREXA) injection 10 mg (10 mg Intramuscular Given 09/01/23 1744)                                                                                                                                     Procedures Procedures  (including critical care time)  Medical Decision Making / ED Course   This patient presents to the ED for concern of fall, this involves an extensive number of treatment options, and is a complaint that carries with it a high risk of complications and morbidity.  The differential diagnosis includes mechanical fall, underlying infection, intracranial hemorrhage, cervical spine injury.   MDM: Patient very agitated, which unfortunately appears to be her baseline based off of her previous hospitalization.  I made the decision to remove the patient's cervical collar despite not being able to obtain the best exam as I believe this will help calm the patient, and the likelihood of the patient with her underlying conditions undergoing neurosurgery are slim.  Patient received an additional Zyprexa.  Will obtain imaging of the patient's head neck and chest.  No abdominal tenderness or bruising.  No injuries to the extremities appreciated on my exam.  Scalp exam was little bit difficult given the patient's agitation, however I appreciated a contusion, small punctate laceration was likely the source of bleeding.  Given the size of the laceration, patient's underlying dementia, believe allowing this to heal by secondary intention is preferable.  Reassessment 9:15 PM-I reviewed the patient's imaging.  Head CT negative, no findings on CT of the chest or neck.  Reviewed the patient's blood work, normal electrolytes, no leukocytosis.  Will discharge patient back to her skilled nursing facility.   Additional history obtained: -Additional history obtained from  EMS -External records from outside source obtained and reviewed including: Chart review including previous notes, labs, imaging, consultation notes   Lab Tests: -I ordered, reviewed, and interpreted labs.   The pertinent results include:   Labs Reviewed  CBC WITH DIFFERENTIAL/PLATELET  BASIC METABOLIC PANEL      EKG   EKG Interpretation Date/Time:    Ventricular Rate:    PR Interval:    QRS Duration:    QT Interval:    QTC Calculation:   R Axis:      Text Interpretation:           Imaging Studies ordered: I ordered imaging studies  I independently visualized and interpreted imaging. I agree with the radiologist interpretation   Medicines ordered and prescription drug management: Meds ordered this encounter  Medications   OLANZapine (ZYPREXA) injection 10 mg   sterile water (  preservative free) injection    Wilburn Cornelia G: cabinet override    -I have reviewed the patients home medicines and have made adjustments as needed  Critical interventions IM sedation   Social Determinants of Health:  Factors impacting patients care include: Nursing home resident, advanced dementia   Reevaluation: After the interventions noted above, I reevaluated the patient and found that they have :improved  Co morbidities that complicate the patient evaluation  Past Medical History:  Diagnosis Date   DDD (degenerative disc disease), cervical    Dementia (HCC)    Depression    GERD (gastroesophageal reflux disease)    Hyperlipidemia    Hypertension    Kidney stones       Dispostion: I considered admission for this patient, however in the absence of findings on imaging or low blood work, believe she is better served by returning to her skilled nursing facility.     Final Clinical Impression(s) / ED Diagnoses Final diagnoses:  None     @PCDICTATION @    Anders Simmonds T, DO 09/01/23 2117    Anders Simmonds T, DO 09/01/23 2118

## 2023-09-01 NOTE — ED Notes (Signed)
Patient is still attempting to get out of her bed.

## 2023-09-01 NOTE — ED Notes (Signed)
 Patient cleaned and changed into a clean brief.

## 2023-09-01 NOTE — ED Notes (Signed)
[facility] called, 2nd on list

## 2023-09-01 NOTE — Discharge Instructions (Signed)
Tina Fitzpatrick blood work and imaging were normal.  She may continue taking all of her medications.  You can apply an ice pack to the back of her head where she struck it.

## 2023-09-01 NOTE — ED Triage Notes (Signed)
The pt arrived by [facility] from [facility]  she fell at  the nursing station ?? Laceration  to the back of her head pt very combative and out of control fighting  ems gave the pt haldol 5mg  on the way here I'm    she has dementia  and does not answer questions asked   seizure pads placed on the stretcher side rails and she  has on mittens

## 2023-09-01 NOTE — ED Notes (Signed)
Patient is continuing to sit up and attempt to leave her bed. RN and MD are aware.

## 2023-09-07 ENCOUNTER — Encounter (HOSPITAL_COMMUNITY): Payer: Self-pay

## 2023-09-07 ENCOUNTER — Emergency Department (HOSPITAL_COMMUNITY)

## 2023-09-07 ENCOUNTER — Emergency Department (HOSPITAL_COMMUNITY)
Admission: EM | Admit: 2023-09-07 | Discharge: 2023-09-08 | Disposition: A | Discharge status: Home / Self Care | Attending: Emergency Medicine | Admitting: Emergency Medicine

## 2023-09-07 ENCOUNTER — Other Ambulatory Visit: Payer: Self-pay

## 2023-09-07 DIAGNOSIS — R4182 Altered mental status, unspecified: Secondary | ICD-10-CM | POA: Diagnosis not present

## 2023-09-07 DIAGNOSIS — F0390 Unspecified dementia without behavioral disturbance: Secondary | ICD-10-CM | POA: Diagnosis not present

## 2023-09-07 DIAGNOSIS — W19XXXA Unspecified fall, initial encounter: Secondary | ICD-10-CM

## 2023-09-07 DIAGNOSIS — I10 Essential (primary) hypertension: Secondary | ICD-10-CM | POA: Diagnosis not present

## 2023-09-07 DIAGNOSIS — W01198A Fall on same level from slipping, tripping and stumbling with subsequent striking against other object, initial encounter: Secondary | ICD-10-CM | POA: Diagnosis not present

## 2023-09-07 DIAGNOSIS — S0990XA Unspecified injury of head, initial encounter: Secondary | ICD-10-CM

## 2023-09-07 DIAGNOSIS — S0003XA Contusion of scalp, initial encounter: Secondary | ICD-10-CM | POA: Insufficient documentation

## 2023-09-07 DIAGNOSIS — Z79899 Other long term (current) drug therapy: Secondary | ICD-10-CM | POA: Insufficient documentation

## 2023-09-07 DIAGNOSIS — S51011A Laceration without foreign body of right elbow, initial encounter: Secondary | ICD-10-CM | POA: Insufficient documentation

## 2023-09-07 DIAGNOSIS — S59901A Unspecified injury of right elbow, initial encounter: Secondary | ICD-10-CM | POA: Diagnosis present

## 2023-09-07 MED ORDER — LORAZEPAM 2 MG/ML IJ SOLN
2.0000 mg | Freq: Once | INTRAMUSCULAR | Status: AC
  Administered 2023-09-07: 2 mg via INTRAMUSCULAR
  Filled 2023-09-07: qty 1

## 2023-09-07 MED ORDER — HALOPERIDOL LACTATE 5 MG/ML IJ SOLN
2.0000 mg | Freq: Once | INTRAMUSCULAR | Status: AC
  Administered 2023-09-07: 2 mg via INTRAMUSCULAR
  Filled 2023-09-07: qty 1

## 2023-09-07 NOTE — ED Provider Notes (Signed)
[place] EMERGENCY DEPARTMENT AT [facility] Provider Note   CSN: [id number] Arrival date & time: 09/07/23  1715     History  Chief Complaint  Patient presents with   Fall   Head Injury    Tina Fitzpatrick is a 72 y.o. female.  The history is provided by the patient and medical records (ems report from nursing). The history is limited by the condition of the patient. No language interpreter was used.  Fall This is a recurrent problem. The problem has not changed since onset.Pertinent negatives include no chest pain, no abdominal pain, no headaches and no shortness of breath. Nothing aggravates the symptoms. Nothing relieves the symptoms. She has tried nothing for the symptoms. The treatment provided no relief.  Head Injury Associated symptoms: no headaches, no nausea, no neck pain and no vomiting        Home Medications Prior to Admission medications   Medication Sig Start Date End Date Taking? Authorizing Provider  acetaminophen (TYLENOL) 500 MG tablet Take 500 mg by mouth in the morning, at noon, and at bedtime.    [provider]  clonazePAM (KLONOPIN) 0.5 MG tablet Take 1 tablet (0.5 mg total) by mouth 2 (two) times daily as needed for anxiety (agitation). 07/20/23   [name], MD  gabapentin (NEURONTIN) 400 MG capsule Take 400 mg by mouth 3 (three) times daily.    [provider]  ipratropium-albuterol (DUONEB) 0.5-2.5 (3) MG/3ML SOLN Take 3 mLs by nebulization every 6 (six) hours as needed (wheezing/SOB).    [provider]  mirtazapine (REMERON SOL-TAB) 15 MG disintegrating tablet Take 0.5 tablets (7.5 mg total) by mouth at bedtime. 07/20/23 08/19/23  [name], MD  neomycin-bacitracin-polymyxin [phone] OINT Apply 1 Application topically See admin instructions. Apply a pea-sized amount of ointment to wound on scalp once a day after cleansing and drying- until healed    [provider]  NON FORMULARY Apply 1  application  topically See admin instructions. Zinc paste 17%- Apply to buttocks/perineal area with incontinent care- every shift (per [facility])    [provider]  Nutritional Supplements (NUTRITIONAL SHAKE) LIQD Take 2 Bottles by mouth See admin instructions. Drink 2 cartons/bottles by mouth three times a day    [provider]  Povidone-Iodine Paint Sponge 10 % SWAB Apply 1 application  topically See admin instructions. Apply to scalp laceration once a day. Cleanse gently with swab, allow to dry, then apply a pea-sized amount of triple antibiotic ointment to the wound. Apply until healed.    [provider]  propranolol (INDERAL) 10 MG tablet Take 10 mg by mouth See admin instructions. Take 10 mg by mouth two times a day and hold for pulse under 55    [provider]  QUEtiapine (SEROQUEL) 25 MG tablet Take 1 tablet (25 mg total) by mouth 2 (two) times daily. 07/20/23 08/19/23  [name], MD      Allergies    Patient has no known allergies.    Review of Systems   Review of Systems  Unable to perform ROS: Dementia  Constitutional:  Negative for chills and fatigue.  Respiratory:  Negative for cough and shortness of breath.   Cardiovascular:  Negative for chest pain.  Gastrointestinal:  Negative for abdominal pain, constipation, diarrhea, nausea and vomiting.  Genitourinary:  Negative for dysuria.  Musculoskeletal:  Negative for back pain and neck pain.  Skin:  Positive for wound.  Neurological:  Negative for headaches.  Psychiatric/Behavioral:  Positive  for agitation.     Physical Exam Updated Vital Signs BP (!) 147/61 (BP Location: Right Arm)   Pulse 79   Resp 18   Ht 5\' 6"  (1.676 m)   Wt 59 kg   SpO2 95%   BMI 20.99 kg/m  Physical Exam Vitals and nursing note reviewed.  Constitutional:      General: She is not in acute distress.    Appearance: She is well-developed. She is not ill-appearing, toxic-appearing or diaphoretic.  HENT:     Head:  Abrasion and contusion present. No laceration.      Nose: No congestion or rhinorrhea.     Mouth/Throat:     Pharynx: No oropharyngeal exudate or posterior oropharyngeal erythema.  Eyes:     Extraocular Movements: Extraocular movements intact.     Conjunctiva/sclera: Conjunctivae normal.     Pupils: Pupils are equal, round, and reactive to light.  Cardiovascular:     Rate and Rhythm: Normal rate and regular rhythm.     Heart sounds: No murmur heard. Pulmonary:     Effort: Pulmonary effort is normal. No respiratory distress.     Breath sounds: Normal breath sounds. No wheezing, rhonchi or rales.  Chest:     Chest wall: No tenderness.  Abdominal:     Palpations: Abdomen is soft.     Tenderness: There is no abdominal tenderness.  Musculoskeletal:        General: No swelling or tenderness.     Right elbow: No lacerations.     Cervical back: Neck supple.     Comments: Skin tears to right elbow.  Patient moving her arm vigorously and with strength.  Intact pulses in arms.   Skin:    General: Skin is warm and dry.     Capillary Refill: Capillary refill takes less than 2 seconds.  Neurological:     General: No focal deficit present.     Mental Status: She is alert. Mental status is at baseline.     Comments: Per EMS at baseline  Psychiatric:        Mood and Affect: Mood normal.     ED Results / Procedures / Treatments   Labs (all labs ordered are listed, but only abnormal results are displayed) Labs Reviewed - No data to display  EKG None  Radiology CT Cervical Spine Wo Contrast Result Date: 09/07/2023 CLINICAL DATA:  Status post fall. EXAM: CT CERVICAL SPINE WITHOUT CONTRAST TECHNIQUE: Multidetector CT imaging of the cervical spine was performed without intravenous contrast. Multiplanar CT image reconstructions were also generated. RADIATION DOSE REDUCTION: This exam was performed according to the departmental dose-optimization program which includes automated exposure  control, adjustment of the mA and/or kV according to patient size and/or use of iterative reconstruction technique. COMPARISON:  September 01, 2023 FINDINGS: Alignment: Normal. Skull base and vertebrae: No acute fracture. No primary bone lesion or focal pathologic process. A metallic density fusion plate and screws are seen along the anterior aspects of the C5, C6 and C7 vertebral bodies. Soft tissues and spinal canal: No prevertebral fluid or swelling. No visible canal hematoma. Disc levels: Marked severity anterior osteophyte formation is seen at the level of C4-C5 and C7-T1. There is anterior surgical fusion of the C5-C6 and C6-C7 levels. Mild multilevel intervertebral disc space narrowing is noted throughout the remainder of the cervical spine. Bilateral moderate to marked severity multilevel facet joint hypertrophy is seen. Upper chest: Negative. Other: None. IMPRESSION: 1. No acute fracture or subluxation in the cervical spine.  2. Anterior surgical fusion of the C5-C6 and C6-C7 levels. 3. Multilevel degenerative changes, as described above. Electronically Signed   By: Aram Candela M.D.   On: 09/07/2023 22:30   DG Elbow Complete Right Result Date: 09/07/2023 CLINICAL DATA:  Fall and trauma to the right elbow. EXAM: RIGHT ELBOW - COMPLETE 3+ VIEW COMPARISON:  None Available. FINDINGS: There is no acute fracture or dislocation. The bones are osteopenic. No significant arthritic changes. No joint effusion. The soft tissues are unremarkable. IMPRESSION: 1. No acute fracture or dislocation. 2. Osteopenia. Electronically Signed   By: Elgie Collard M.D.   On: 09/07/2023 22:30   CT HEAD WO CONTRAST ( ) Result Date: 09/07/2023 CLINICAL DATA:  Status post fall. EXAM: CT HEAD WITHOUT CONTRAST TECHNIQUE: Contiguous axial images were obtained from the base of the skull through the vertex without intravenous contrast. RADIATION DOSE REDUCTION: This exam was performed according to the departmental  dose-optimization program which includes automated exposure control, adjustment of the mA and/or kV according to patient size and/or use of iterative reconstruction technique. COMPARISON:  September 01, 2023 FINDINGS: Brain: There is mild cerebral atrophy with widening of the extra-axial spaces and ventricular dilatation. There are areas of decreased attenuation within the white matter tracts of the supratentorial brain, consistent with microvascular disease changes. Vascular: No hyperdense vessel or unexpected calcification. Skull: Normal. Negative for fracture or focal lesion. Sinuses/Orbits: Moderate to marked severity bilateral ethmoid sinus mucosal thickening is seen, left greater than right. Other: Mild right posterior parietal and left parietooccipital scalp soft tissue swelling is noted. IMPRESSION: 1. Mild right posterior parietal and left parietooccipital scalp soft tissue swelling without evidence for an acute fracture or acute intracranial abnormality. 2. Generalized cerebral atrophy with chronic white matter small vessel ischemic changes. 3. Moderate to marked severity bilateral ethmoid sinus disease. Electronically Signed   By: Aram Candela M.D.   On: 09/07/2023 22:25    Procedures Procedures    Medications Ordered in ED Medications  haloperidol lactate (HALDOL) injection 2 mg (2 mg Intramuscular Given 09/07/23 1846)  haloperidol lactate (HALDOL) injection 2 mg (2 mg Intramuscular Given 09/07/23 2015)  LORazepam (ATIVAN) injection 2 mg (2 mg Intramuscular Given 09/07/23 2125)    ED Course/ Medical Decision Making/ A&P                                 Medical Decision Making Amount and/or Complexity of Data Reviewed Radiology: ordered.  Risk Prescription drug management.    ASPEN SHEWMAKE is a 72 y.o. female with a past medical history significant for dementia, hypertension, hyperlipidemia, GERD, and recurrent falls who presents for fall.  According to EMS, patient is at  her mental status baseline and had a mechanical fall today hitting her right elbow and right forehead on the ground.  She sustained some skin tears and abrasions but is not having any complaints other than complaining about being here.  She used very clear yet vulgar language with our team tonight and only reports that she would like to go home.  She is physically denying headache, neck pain, chest pain, back pain, abdominal pain, or pain in extremities.  I spoke to the patient's husband who reports he did not know of any preceding symptoms such as fevers, chills, cough, urinary or GI changes.  She is denying any other complaints to me now.  On exam, lungs clear.  Chest nontender.  Abdomen nontender.  Back  nontender.  Patient vigorously moving both extremities and does not seem to have any weakness.  She did have skin tears to her right elbow that did not appear deep or needing laceration repair at this time.  Good pulses distally.  Patient moving neck in all directions and has a small hematoma/abrasion to her right head.  Pupils are symmetric and reactive with normal extract movements and she is moving her neck vigorously.  I spoke to the patient's husband who agrees with getting CT of the head and neck and x-ray of the elbow and if this is reassuring, plan to discharge back to facility.  Will give some Haldol to help facilitate safe image collection.  Anticipate discharge if imaging is reassuring.              We tried to and patient calm down.  She was unable to get the CT scan.  We tried a second 2 of Haldol and again patient try to get the CT but became too agitated.  We will have her monitored by nursing and will give her some Ativan IM so that we can get the images safely.  Anticipate discharge if imaging reassuring.  10:35 PM Images just returned showing no evidence of skull fracture, neck fracture, or elbow fracture.  Will discharge back to facility for outpatient follow-up of likely  musculoskeletal injuries after this fall.  Elbow will be cleaned and dressed for skin tears.  10:39 PM Spoke to the patient's husband who agrees with plan for discharge home and we went through all the findings together.  Patient resting comfortably and will be discharged for outpatient follow-up.         Final Clinical Impression(s) / ED Diagnoses Final diagnoses:  Injury of head, initial encounter  Contusion of scalp, initial encounter  Skin tear of right elbow without complication, initial encounter  Fall, initial encounter    Rx / DC Orders ED Discharge Orders     None       Clinical Impression: 1. Injury of head, initial encounter   2. Contusion of scalp, initial encounter   3. Skin tear of right elbow without complication, initial encounter   4. Fall, initial encounter     Disposition: Discharge  Condition: Good  I have discussed the results, Dx and Tx plan with the pt(& family if present). He/she/they expressed understanding and agree(s) with the plan. Discharge instructions discussed at great length. Strict return precautions discussed and pt &/or family have verbalized understanding of the instructions. No further questions at time of discharge.    New Prescriptions   No medications on file    Follow Up: [facility] [address] [place] [address] [place] [address] [phone] Schedule an appointment as soon as possible for a visit    [facility] Emergency Department at [facility] [address] [place] [address] [phone]       Bryar Rennie, Canary Brim, MD 09/07/23 [phone]

## 2023-09-07 NOTE — ED Notes (Signed)
CT attempted to take pt for imaging and pt continuing to move around on table and try and get off. Pt returned to dept and MD made aware. Medications ordered.

## 2023-09-07 NOTE — ED Notes (Signed)
[facility] contacted and transportation arranged.

## 2023-09-07 NOTE — ED Notes (Signed)
MD spoke with her husband who was agreeable to discharge.

## 2023-09-07 NOTE — ED Notes (Signed)
Attempted CT and pt not able to hold still for scan and trying to come off table. Pt unable to be redirected in CT. MD made aware.

## 2023-09-07 NOTE — ED Notes (Signed)
Pt awake and sitting up in bed, yelling at staff, redirected.

## 2023-09-07 NOTE — Discharge Instructions (Signed)
Your history, exam, workup today showed superficial contusion and skin tears from your fall but no evidence of concerning injury on CT imaging and x-rays.  We did CT of your head and neck and did not show any skull fracture or neck fracture or intracranial bleeding.  Your elbow imaging did not show acute fracture either.  You were able to move all of your extremities and rest of the exam was reassuring.  Given your lack of any preceding symptoms we agreed to hold on extensive lab workup at this time.  We feel you are safe for discharge home now.  Your elbow injuries were more of a skin tear that was cleaned and dressed and your tetanus shot was up-to-date.  Please rest and stay hydrated and follow-up with your primary doctor.  If any symptoms change or worsen please return to the nearest emergency department.

## 2023-09-07 NOTE — ED Triage Notes (Signed)
BIBA from memory care facility. Fall w/ head injury. reported by staff . Skin tear on R elbow. Pt has hx of dementia. Per EMS at baseline mentation.

## 2023-09-07 NOTE — ED Notes (Signed)
Pt resting at this time. Rise and fall of chest noted.

## 2024-02-28 ENCOUNTER — Emergency Department (HOSPITAL_COMMUNITY)

## 2024-02-28 ENCOUNTER — Other Ambulatory Visit: Payer: Self-pay

## 2024-02-28 ENCOUNTER — Emergency Department (HOSPITAL_COMMUNITY)
Admission: EM | Admit: 2024-02-28 | Discharge: 2024-02-28 | Disposition: A | Discharge status: Home / Self Care | Attending: Emergency Medicine | Admitting: Emergency Medicine

## 2024-02-28 ENCOUNTER — Encounter (HOSPITAL_COMMUNITY): Payer: Self-pay

## 2024-02-28 DIAGNOSIS — W19XXXA Unspecified fall, initial encounter: Secondary | ICD-10-CM | POA: Insufficient documentation

## 2024-02-28 DIAGNOSIS — R451 Restlessness and agitation: Secondary | ICD-10-CM | POA: Insufficient documentation

## 2024-02-28 DIAGNOSIS — F0390 Unspecified dementia without behavioral disturbance: Secondary | ICD-10-CM | POA: Diagnosis not present

## 2024-02-28 DIAGNOSIS — S0993XA Unspecified injury of face, initial encounter: Secondary | ICD-10-CM | POA: Diagnosis present

## 2024-02-28 DIAGNOSIS — S0181XA Laceration without foreign body of other part of head, initial encounter: Secondary | ICD-10-CM | POA: Insufficient documentation

## 2024-02-28 MED ORDER — HALOPERIDOL LACTATE 5 MG/ML IJ SOLN
2.0000 mg | Freq: Once | INTRAMUSCULAR | Status: AC
  Administered 2024-02-28: 2 mg via INTRAMUSCULAR
  Filled 2024-02-28: qty 1

## 2024-02-28 MED ORDER — LIDOCAINE-EPINEPHRINE (PF) 2 %-1:200000 IJ SOLN
10.0000 mL | Freq: Once | INTRAMUSCULAR | Status: AC
  Administered 2024-02-28: 10 mL via INTRADERMAL
  Filled 2024-02-28: qty 20

## 2024-02-28 MED ORDER — MIDAZOLAM HCL 2 MG/2ML IJ SOLN
1.0000 mg | Freq: Once | INTRAMUSCULAR | Status: AC
  Administered 2024-02-28: 1 mg via INTRAMUSCULAR
  Filled 2024-02-28: qty 2

## 2024-02-28 MED ORDER — LIDOCAINE-EPINEPHRINE-TETRACAINE (LET) TOPICAL GEL
3.0000 mL | Freq: Once | TOPICAL | Status: AC
  Administered 2024-02-28: 3 mL via TOPICAL
  Filled 2024-02-28: qty 3

## 2024-02-28 MED ORDER — HALOPERIDOL LACTATE 5 MG/ML IJ SOLN
5.0000 mg | Freq: Once | INTRAMUSCULAR | Status: AC
  Administered 2024-02-28: 5 mg via INTRAMUSCULAR
  Filled 2024-02-28: qty 1

## 2024-02-28 MED ORDER — MIDAZOLAM HCL 2 MG/2ML IJ SOLN
2.0000 mg | Freq: Once | INTRAMUSCULAR | Status: AC
  Administered 2024-02-28: 2 mg via INTRAMUSCULAR
  Filled 2024-02-28: qty 2

## 2024-02-28 NOTE — ED Provider Notes (Signed)
 [place] EMERGENCY DEPARTMENT AT [facility] Provider Note   CSN: [id number] Arrival date & time: 02/28/24  0546     Patient presents with: Tina Fitzpatrick   Tina Fitzpatrick is a 73 y.o. female.   Patient to ED after unwitnessed fall at [facility] facility. History of dementia. Found on the floor with facial laceration, awake. Agitated and aggressive with EMS. Received Versed  prior to arrival. Agitation continues in the ED. Not anticoagulated.   Fall       Prior to Admission medications   Medication Sig Start Date End Date Taking? Authorizing Provider  clonazePAM  (KLONOPIN ) 0.5 MG tablet Take 1 tablet (0.5 mg total) by mouth 2 (two) times daily as needed for anxiety (agitation). 07/20/23  Yes Vada Garibaldi, MD  gabapentin  (NEURONTIN ) 400 MG capsule Take 400 mg by mouth 3 (three) times daily.   Yes [provider]  HYDROcodone-acetaminophen  (NORCO) 7.5-325 MG tablet Take 1 tablet by mouth 3 (three) times daily.   Yes [provider]  mirtazapine  (REMERON ) 15 MG tablet Take 7.5 mg by mouth at bedtime.   Yes [provider]  Nutritional Supplements (NUTRITIONAL SHAKE) LIQD Take 2 Bottles by mouth 3 (three) times daily. Mighty Shake.   Yes [provider]  propranolol  (INDERAL ) 10 MG tablet Take 10 mg by mouth See admin instructions. Give 1 tablet (10mg ) by mouth twice daily. Hold for pulse < 55.   Yes [provider]  sennosides-docusate sodium  (SENOKOT-S) 8.6-50 MG tablet Take 2 tablets by mouth at bedtime.   Yes [provider]  traZODone  (DESYREL ) 100 MG tablet Take 100 mg by mouth at bedtime.   Yes [provider]  traZODone  (DESYREL ) 50 MG tablet Take 25-50 mg by mouth See admin instructions. Give 1 tablet (50mg ) by mouth every morning and 1/2 tablet (25mg ) in the early afternoon.   Yes [provider]  ZINC OXIDE EX Apply 1 application  topically See admin instructions. Zinc Oxide 17% paste  : Apply topically to buttocks/perineal for skin protection with incontinent care three times daily with every shift.   Yes [provider]    Allergies: Patient has no known allergies.    Review of Systems  Updated Vital Signs BP 110/70   Pulse 77   Temp 98 F (36.7 C) (Axillary)   Resp 17   SpO2 100%   Physical Exam Vitals and nursing note reviewed.  Constitutional:      Comments: Underweight, small frame  HENT:     Head:     Comments: Laceration to forehead, approximately 3 cm  Cardiovascular:     Rate and Rhythm: Normal rate.  Pulmonary:     Effort: Pulmonary effort is normal.  Abdominal:     General: There is no distension.     Palpations: Abdomen is soft.     Tenderness: There is no abdominal tenderness.   Musculoskeletal:     Comments: Moves all extremities without limitation. No pelvic tenderness. No c-spine tenderness.    Neurological:     GCS: GCS eye subscore is 4. GCS verbal subscore is 5. GCS motor subscore is 6.     Motor: Motor function is intact.     (all labs ordered are listed, but only abnormal results are displayed) Labs Reviewed - No data to display  EKG: None  Radiology: CT Head Wo Contrast Result Date: 02/28/2024 CLINICAL DATA:  Head trauma, minor (Age >= 65y) EXAM: CT HEAD WITHOUT CONTRAST TECHNIQUE: Contiguous axial images  were obtained from the base of the skull through the vertex without intravenous contrast. RADIATION DOSE REDUCTION: This exam was performed according to the departmental dose-optimization program which includes automated exposure control, adjustment of the mA and/or kV according to patient size and/or use of iterative reconstruction technique. COMPARISON:  None Available. FINDINGS: Brain: Proportional prominence of the ventricles and sulci, consistent with diffuse cerebral parenchymal volume loss. The ventricles otherwise maintained midline position without midline shift. Gray-white differentiation is  preserved.Periventricular and subcortical white matter hypoattenuation, most consistent with changes of moderate chronic ischemic microvascular disease.No evidence of acute territorial infarction, extra-axial fluid collection, hemorrhage, or mass lesion. The basilar cisterns are patent without downward herniation. The cerebellar hemispheres and vermis are well formed without mass lesion or focal attenuation abnormality. Vascular: No hyperdense vessel. Skull: Negative for fracture or focal lesion. Sinuses/Orbits: The paranasal sinuses and mastoids are clear.The globes appear intact. No retrobulbar hematoma.Left supraorbital hematoma measuring 8 mm thick. Other: None. IMPRESSION: 1. Left supraorbital subcutaneous hematoma, measuring 8 mm thick. No skull fracture. No acute intracranial hemorrhage, territorial infarction, or intracranial mass. 2. Similar cerebral volume loss with sequelae of advanced chronic ischemic microvascular disease. Electronically Signed   By: Rance Burrows M.D.   On: 02/28/2024 11:02   CT Cervical Spine Wo Contrast Result Date: 02/28/2024 CLINICAL DATA:  Neck trauma.  Head injury. EXAM: CT CERVICAL SPINE WITHOUT CONTRAST TECHNIQUE: Multidetector CT imaging of the cervical spine was performed without intravenous contrast. Multiplanar CT image reconstructions were also generated. RADIATION DOSE REDUCTION: This exam was performed according to the departmental dose-optimization program which includes automated exposure control, adjustment of the mA and/or kV according to patient size and/or use of iterative reconstruction technique. COMPARISON:  CT cervical spine 09/07/2023. FINDINGS: Alignment: Physiologic. Skull base and vertebrae: No evidence of acute fracture or traumatic subluxation. Status post C5-7 ACDF with intact hardware and solid interbody fusion. Congenital incomplete posterior arch of C1. Soft tissues and spinal canal: No prevertebral fluid or swelling. No visible canal hematoma.  Disc levels: Status post C5-7 ACDF with intact hardware and solid interbody fusion. Mild spondylosis above the fusion with uncinate spurring and facet hypertrophy. No large disc herniation or high-grade spinal stenosis identified. Upper chest: Aortic and great vessel atherosclerosis. The lung apices are clear. Other: None. IMPRESSION: 1. No evidence of acute cervical spine fracture, traumatic subluxation or static signs of instability. 2. Status post C5-7 ACDF with intact hardware and solid interbody fusion. 3.  Aortic Atherosclerosis (ICD10-I70.0). Electronically Signed   By: Elmon Hagedorn M.D.   On: 02/28/2024 10:48     .Laceration Repair  Date/Time: 02/28/2024 8:20 AM  Performed by: Mandy Second, PA-C Authorized by: Mandy Second, PA-C   Consent:    Consent obtained:  Emergent situation Universal protocol:    Patient identity confirmed:  Arm band Anesthesia:    Anesthesia method:  Local infiltration   Local anesthetic:  Lidocaine  2% WITH epi Laceration details:    Location:  Face   Face location:  Forehead   Length (cm):  3 Treatment:    Area cleansed with:  Saline   Amount of cleaning:  Standard   Debridement:  None Skin repair:    Repair method:  Sutures   Suture size:  5-0   Suture material:  Prolene   Suture technique:  Running   Number of sutures:  6 Approximation:    Approximation:  Close Repair type:    Repair type:  Simple Post-procedure details:    Procedure completion:  Tolerated    Medications Ordered in the ED  lidocaine -EPINEPHrine -tetracaine  (LET) topical gel (3 mLs Topical Given 02/28/24 0614)  haloperidol  lactate (HALDOL ) injection 2 mg (2 mg Intramuscular Given 02/28/24 0640)  midazolam  (VERSED ) injection 1 mg (1 mg Intramuscular Given 02/28/24 0648)  lidocaine -EPINEPHrine  (XYLOCAINE  W/EPI) 2 %-1:200000 (PF) injection 10 mL (10 mLs Intradermal Given 02/28/24 0830)  haloperidol  lactate (HALDOL ) injection 5 mg (5 mg Intramuscular Given 02/28/24 0851)   midazolam  (VERSED ) injection 2 mg (2 mg Intramuscular Given 02/28/24 1002)    Clinical Course as of 02/28/24 1157  Mon Feb 28, 2024  0726 Recheck: patient is sleeping. 100% O2 sat on monitor. Waiting CT. [SU]  1610 Patient's laceration has been repaired. She remains awake and agitated, restless. CT not possible as she will not cooperate. IM Haldol  5 mg without effect. Versed  2 mg added.  [SU]  1129 CT head per radiology:   IMPRESSION: 1. Left supraorbital subcutaneous hematoma, measuring 8 mm thick. No skull fracture. No acute intracranial hemorrhage, territorial infarction, or intracranial mass. 2. Similar cerebral volume loss with sequelae of advanced chronic ischemic microvascular disease.  CT c-spine per radiology: IMPRESSION: 1. No evidence of acute cervical spine fracture, traumatic subluxation or static signs of instability. 2. Status post C5-7 ACDF with intact hardware and solid interbody fusion. 3.  Aortic Atherosclerosis (ICD10-I70.0).  [SU]  1156 Facial wound: the patient pulled a suture loose so Dermabond was added for wound care/protection. Care instructions written for [facility] staff. Stable for discharge back to facility. [SU]    Clinical Course User Index [SU] Mandy Second, PA-C                                 Medical Decision Making Amount and/or Complexity of Data Reviewed Radiology: ordered.  Risk Prescription drug management.        Final diagnoses:  Fall, initial encounter  Facial laceration, initial encounter    ED Discharge Orders     None          Rama Burkitt 02/28/24 1157    Kelsey Patricia, MD 03/01/24 [id number]

## 2024-02-28 NOTE — Discharge Instructions (Signed)
 The laceration was repaired with sutures that will need to be removed. Dermabond was applied over the sutures as the patient pulled a stitch out while in the ED. Remove the rest of the sutures when dermabond erodes.   Head and neck CT imaging were negative. She is stable for discharge back to the facility.

## 2024-02-28 NOTE — ED Notes (Signed)
 Pt. Given apple juice and graham crackers. Tech fed pt. Due to being currently restrained.

## 2024-02-28 NOTE — ED Provider Notes (Signed)
 Medical Screening Exam initiated and feel Pt stable for completion of MSE by private attending. Patient from [facility], history of dementia.  She had an unwitnessed fall with a laceration to her forehead.  She was aggressive for paramedics and was administered Versed  prior to ED arrival.  Patient denies any pain.  There is a laceration over her left eyebrow.  No C-spine tenderness.  No tenderness to the arms, trunk, hips.  Will provide Let for her laceration, check CT head and C-spine.   Kelsey Patricia, MD 02/28/24 0600

## 2024-02-28 NOTE — ED Triage Notes (Signed)
 Arrives [facility] from [facility] after an unwitnessed fall. ~3cm x 1cm laceration above left eyebrow.   Dementia.   Paramedics report pt highly aggressive on their arrival. Administered versed .   Refused c-collar.

## 2024-02-28 NOTE — ED Notes (Signed)
 Tech applied dressing on suture site

## 2024-08-07 ENCOUNTER — Emergency Department (HOSPITAL_COMMUNITY)

## 2024-08-07 ENCOUNTER — Emergency Department (HOSPITAL_COMMUNITY)
Admission: EM | Admit: 2024-08-07 | Discharge: 2024-08-07 | Disposition: A | Discharge status: Home / Self Care | Attending: Emergency Medicine | Admitting: Emergency Medicine

## 2024-08-07 ENCOUNTER — Encounter (HOSPITAL_COMMUNITY): Payer: Self-pay

## 2024-08-07 DIAGNOSIS — S40819A Abrasion of unspecified upper arm, initial encounter: Secondary | ICD-10-CM

## 2024-08-07 DIAGNOSIS — S0001XA Abrasion of scalp, initial encounter: Secondary | ICD-10-CM

## 2024-08-07 DIAGNOSIS — F0390 Unspecified dementia without behavioral disturbance: Secondary | ICD-10-CM | POA: Diagnosis not present

## 2024-08-07 DIAGNOSIS — W19XXXA Unspecified fall, initial encounter: Secondary | ICD-10-CM | POA: Diagnosis not present

## 2024-08-07 DIAGNOSIS — S40812A Abrasion of left upper arm, initial encounter: Secondary | ICD-10-CM | POA: Diagnosis not present

## 2024-08-07 DIAGNOSIS — S0101XA Laceration without foreign body of scalp, initial encounter: Secondary | ICD-10-CM | POA: Diagnosis not present

## 2024-08-07 DIAGNOSIS — S50311A Abrasion of right elbow, initial encounter: Secondary | ICD-10-CM | POA: Insufficient documentation

## 2024-08-07 DIAGNOSIS — Z79899 Other long term (current) drug therapy: Secondary | ICD-10-CM | POA: Diagnosis not present

## 2024-08-07 DIAGNOSIS — S0003XA Contusion of scalp, initial encounter: Secondary | ICD-10-CM

## 2024-08-07 DIAGNOSIS — S0990XA Unspecified injury of head, initial encounter: Secondary | ICD-10-CM | POA: Diagnosis present

## 2024-08-07 MED ORDER — HALOPERIDOL LACTATE 5 MG/ML IJ SOLN: 2.0000 mg | Freq: Once | INTRAMUSCULAR | Status: DC

## 2024-08-07 MED ORDER — LIDOCAINE-EPINEPHRINE-TETRACAINE (LET) TOPICAL GEL
3.0000 mL | Freq: Once | TOPICAL | Status: AC
  Administered 2024-08-07: 3 mL via TOPICAL
  Filled 2024-08-07: qty 3

## 2024-08-07 MED ORDER — LORAZEPAM 1 MG PO TABS
1.0000 mg | ORAL_TABLET | Freq: Once | ORAL | Status: AC
  Administered 2024-08-07: 1 mg via ORAL
  Filled 2024-08-07: qty 1

## 2024-08-07 MED ORDER — HALOPERIDOL LACTATE 5 MG/ML IJ SOLN
INTRAMUSCULAR | Status: AC
  Administered 2024-08-07: 5 mg
  Filled 2024-08-07: qty 1

## 2024-08-07 NOTE — ED Notes (Signed)
 Report given to RN at [facility].

## 2024-08-07 NOTE — ED Notes (Signed)
 Patient received pericare and was also given Ativan  1 mg for agitation

## 2024-08-07 NOTE — ED Notes (Signed)
 Patient continues to get out of bed and attempt to walk around in the department. Patient is difficult to redirect and requires staff to one-on-one stay with her and walk with her. Patient does not respond to verbal redirection.

## 2024-08-07 NOTE — ED Triage Notes (Signed)
 Per EMS, Pt, from [facility], presents w/ posterior head laceration.  Facility reports they found Pt in bed this morning w/ laceration.  Unknown fall or how injury occurred.  Pt is combative and at neuro baseline.  Pt able to move all extremities.

## 2024-08-07 NOTE — ED Notes (Signed)
 PTAR has been scheduled for the patient to return to [facility].  There is no ETA available.  RN made aware.

## 2024-08-07 NOTE — ED Notes (Signed)
Walked patient to the bathroom patient did well 

## 2024-08-07 NOTE — ED Provider Notes (Addendum)
 [facility] EMERGENCY DEPARTMENT AT [facility] Provider Note   CSN: [id number] Arrival date & time: 08/07/24  9043     Patient presents with: Head Laceration   Tina Fitzpatrick is a 73 y.o. female.   HPI   73 year old female with medical history significant for dementia presenting from [place] after an unwitnessed fall.  The patient sustained an abrasion to her posterior occiput.  She was combative and agitated with EMS however per facility staff she is currently at her baseline.  Remainder of history limited by patient's dementia.  Per EMS, it is unknown how the fall or injury occurred.  The patient was found in bed this morning with an abrasion.  CBG unremarkable with EMS. Moving all extremities on arrival, combative on arrival and administered 5 mg of Haldol  IM on arrival for staff and patient safety.  She arrives GCS 14, ABC intact.  Prior to Admission medications   Medication Sig Start Date End Date Taking? Authorizing Provider  clonazePAM  (KLONOPIN ) 0.5 MG tablet Take 1 tablet (0.5 mg total) by mouth 2 (two) times daily as needed for anxiety (agitation). Patient taking differently: Take 0.5 mg by mouth See admin instructions. Give 1 tablet (0.5mg ) by mouth every Tuesday, Thursday and Saturday at day (... directions cut off). Also, give 1 tablet by mouth daily as needed for agitation. 07/20/23  Yes Ghimire, Kuber, MD  gabapentin  (NEURONTIN ) 400 MG capsule Take 400 mg by mouth 3 (three) times daily.   Yes [provider]  HYDROcodone-acetaminophen  (NORCO/VICODIN) 5-325 MG tablet Take 1 tablet by mouth 3 (three) times daily.   Yes [provider]  mirtazapine  (REMERON ) 15 MG tablet Take 7.5 mg by mouth at bedtime.   Yes [provider]  Nutritional Supplements (NUTRITIONAL SHAKE) LIQD Take 2 Containers by mouth 3 (three) times daily. Mighty Shake   Yes [provider]  propranolol  (INDERAL ) 10 MG tablet Take 10 mg by mouth in the  morning and at bedtime. HOLD for pulse < 55.   Yes [provider]  sennosides-docusate sodium  (SENOKOT-S) 8.6-50 MG tablet Take 2 tablets by mouth at bedtime.   Yes [provider]  traZODone  (DESYREL ) 100 MG tablet Take 100 mg by mouth at bedtime.   Yes [provider]  traZODone  (DESYREL ) 50 MG tablet Take 25-50 mg by mouth See admin instructions. Give 1 tablet (50mg ) by mouth in the morning (at 0800) and give 1/2 tablet (25mg ) in the afternoon (at 1300).   Yes [provider]  ZINC OXIDE EX Apply 1 application  topically See admin instructions. Apply zinc oxide 17% paste. Apply to buttocks/perineal area for skin protection with incontinence care - three times daily with every shift.   Yes [provider]    Allergies: Patient has no known allergies.    Review of Systems  Unable to perform ROS: Dementia    Updated Vital Signs BP 124/67   Pulse (!) 52   Temp 98 F (36.7 C) (Oral)   Resp 13   Ht 5' 6 (1.676 m)   Wt 44 kg   SpO2 100%   BMI 15.66 kg/m   Physical Exam Vitals and nursing note reviewed.  Constitutional:      General: She is not in acute distress.    Appearance: She is well-developed.     Comments: GCS 14, ABC intact  HENT:     Head: Normocephalic.     Comments: Hematoma and abrasion to scalp along the posterior occiput  Eyes:     Extraocular Movements: Extraocular movements intact.     Conjunctiva/sclera: Conjunctivae normal.     Pupils: Pupils are equal, round, and reactive to light.  Neck:     Comments: No midline tenderness to palpation of the cervical spine.  Range of motion intact Cardiovascular:     Rate and Rhythm: Normal rate and regular rhythm.     Heart sounds: No murmur heard. Pulmonary:     Effort: Pulmonary effort is normal. No respiratory distress.     Breath sounds: Normal breath sounds.  Chest:     Comments: Clavicles stable nontender to AP compression.  Chest wall stable and nontender to AP and  lateral compression. Abdominal:     Palpations: Abdomen is soft.     Tenderness: There is no abdominal tenderness.     Comments: Pelvis stable to lateral compression  Musculoskeletal:     Cervical back: Neck supple.     Comments: No midline tenderness to palpation of the thoracic or lumbar spine.  Extremities atraumatic with intact range of motion with the exception of abrasions to right elbow and left arm, hemostatic  Skin:    General: Skin is warm and dry.  Neurological:     Mental Status: She is alert.     Comments: Cranial nerves II through XII grossly intact.  Moving all 4 extremities spontaneously.  Sensation grossly intact all 4 extremities     (all labs ordered are listed, but only abnormal results are displayed) Labs Reviewed - No data to display  EKG: EKG Interpretation Date/Time:  Monday August 07 2024 11:49:45 EST Ventricular Rate:  49 PR Interval:  211 QRS Duration:  107 QT Interval:  487 QTC Calculation: 440 R Axis:   57  Text Interpretation: Sinus bradycardia No significant change since last tracing beyond slowed HR, QTC normal Confirmed by Jerrol Agent ([id number]) on 08/07/2024 11:56:04 AM  Radiology: DG Chest Portable 1 View Result Date: 08/07/2024 CLINICAL DATA:  Fall. EXAM: PORTABLE CHEST 1 VIEW COMPARISON:  Chest radiograph dated 07/14/2023. FINDINGS: No focal consolidation, pleural effusion or pneumothorax. Mild cardiomegaly. Degenerative changes spine. No acute osseous pathology. IMPRESSION: 1. No active disease. 2. Mild cardiomegaly. Electronically Signed   By: Vanetta Chou M.D.   On: 08/07/2024 12:14   CT Head Wo Contrast Result Date: 08/07/2024 CLINICAL DATA:  Posterior head laceration. EXAM: CT HEAD WITHOUT CONTRAST TECHNIQUE: Contiguous axial images were obtained from the base of the skull through the vertex without intravenous contrast. RADIATION DOSE REDUCTION: This exam was performed according to the departmental dose-optimization program which  includes automated exposure control, adjustment of the mA and/or kV according to patient size and/or use of iterative reconstruction technique. COMPARISON:  02/28/2024 FINDINGS: Brain: There is no evidence for acute hemorrhage, hydrocephalus, mass lesion, or abnormal extra-axial fluid collection. No definite CT evidence for acute infarction. Diffuse loss of parenchymal volume is consistent with atrophy. Patchy low attenuation in the deep hemispheric and periventricular white matter is nonspecific, but likely reflects chronic microvascular ischemic demyelination. Vascular: No hyperdense vessel or unexpected calcification. Skull: No evidence for fracture. No worrisome lytic or sclerotic lesion. Sinuses/Orbits: The visualized paranasal sinuses and mastoid air cells are clear. Visualized portions of the globes and intraorbital fat are unremarkable. Other: None. IMPRESSION: 1. No acute intracranial abnormality. 2. Atrophy with chronic small vessel ischemic disease. Electronically Signed   By: Camellia Candle M.D.   On: 08/07/2024 11:00   CT Cervical Spine Wo Contrast Result Date: 08/07/2024 CLINICAL DATA:  Neck trauma.  Posterior head laceration. EXAM: CT CERVICAL SPINE WITHOUT CONTRAST TECHNIQUE: Multidetector CT imaging of the cervical spine was performed without intravenous contrast. Multiplanar CT image reconstructions were also generated. RADIATION DOSE REDUCTION: This exam was performed according to the departmental dose-optimization program which includes automated exposure control, adjustment of the mA and/or kV according to patient size and/or use of iterative reconstruction technique. COMPARISON:  CT cervical spine 02/28/2024 FINDINGS: Alignment: Normal. Skull base and vertebrae: No evidence of acute cervical spine fracture or traumatic subluxation. Postsurgical changes from previous C5-7 ACDF with solid interbody ankylosis. Congenital incomplete arch of C1 again noted. Soft tissues and spinal canal: No  prevertebral fluid or swelling. No visible canal hematoma. Disc levels: Status post C5-7 ACDF without significant change. Stable mild spondylosis above the fusion without evidence of large disc herniation or high-grade foraminal narrowing. Upper chest: Stable mild scarring at the right lung apex. Other: Bilateral carotid atherosclerosis. IMPRESSION: 1. No evidence of acute cervical spine fracture, traumatic subluxation or static signs of instability. 2. Stable postsurgical and degenerative changes as described. Electronically Signed   By: Elsie Perone M.D.   On: 08/07/2024 10:54     .Laceration Repair  Date/Time: 08/07/2024 1:56 PM  Performed by: Jerrol Agent, MD Authorized by: Jerrol Agent, MD   Consent:    Consent obtained:  Emergent situation Universal protocol:    Patient identity confirmed:  Arm band Laceration details:    Location:  Scalp   Scalp location:  Occipital   Length (cm):  0.3 Treatment:    Area cleansed with:  Soap and water    Amount of cleaning:  Standard Skin repair:    Repair method:  Tissue adhesive Approximation:    Approximation:  Close Repair type:    Repair type:  Simple Post-procedure details:    Dressing:  Open (no dressing)   Procedure completion:  Tolerated    Medications Ordered in the ED  haloperidol  lactate (HALDOL ) 5 MG/ML injection (5 mg  Given 08/07/24 1006)  LORazepam  (ATIVAN ) tablet 1 mg (1 mg Oral Given 08/07/24 1301)  lidocaine -EPINEPHrine -tetracaine  (LET) topical gel (3 mLs Topical Given 08/07/24 1301)                                    Medical Decision Making Amount and/or Complexity of Data Reviewed Radiology: ordered.  Risk Prescription drug management.     73 year old female with medical history significant for dementia presenting from [place] after an unwitnessed fall.  The patient sustained an abrasion to her posterior occiput.  She was combative and agitated with EMS however per facility staff she is  currently at her baseline.  Remainder of history limited by patient's dementia.  Per EMS, it is unknown how the fall or injury occurred.  The patient was found in bed this morning with an abrasion.  CBG unremarkable with EMS. Moving all extremities on arrival, combative on arrival and administered 5 mg of Haldol  IM on arrival for staff and patient safety.  She arrives GCS 14, ABC intact.  On arrival, the patient was afebrile, mildly bradycardic heart rate 55, hemodynamically stable.  Presenting with a scalp abrasion and hematoma after an unwitnessed fall.  Sustained a small acute skin tear as well for which a bandaid was placed.  Per EMS report from the facility, patient is at her baseline neurologically.  Patient initially was combative and was administered 5 mg of Haldol .  We  are then able to safely obtain CT imaging of the head C-spine and x-ray imaging of the chest.  CT Head: IMPRESSION:  1. No acute intracranial abnormality.  2. Atrophy with chronic small vessel ischemic disease.   CT Cervical Spine: IMPRESSION:  1. No evidence of acute cervical spine fracture, traumatic  subluxation or static signs of instability.  2. Stable postsurgical and degenerative changes as described.   CXR: IMPRESSION:  1. No active disease.  2. Mild cardiomegaly.    EKG: Sinus bradycardia, ventricular rate 4 9, no significant ischemic changes, QTc normal.  Patient administered oral Ativan , scalp abrasion appears to have a small laceration component subsequently repaired Dermabond.  Ambulatory in the emergency department with no difficulty and appears to be at her mental baseline.  Did discuss the care of the patient with her husband who is her healthcare power of attorney.  No other injuries identified on primary or secondary survey, patient overall stable for discharge back to her memory care facility at this time.  Patient is at baseline per her husband, stable for discharge.      Final diagnoses:   Injury of head, initial encounter  Abrasion of scalp, initial encounter  Hematoma of scalp, initial encounter  Abrasion of upper extremity, unspecified laterality, initial encounter    ED Discharge Orders     None          Jerrol Agent, MD 08/07/24 1400    Jerrol Agent, MD 08/07/24 1723

## 2024-08-07 NOTE — ED Notes (Signed)
 Patient transported to CT

## 2024-08-07 NOTE — Discharge Instructions (Addendum)
 CT imaging of the head and cervical spine was negative for acute traumatic injury.
